# Patient Record
Sex: Female | Born: 1960 | Race: Black or African American | Hispanic: No | Marital: Single | Smoking: Former smoker
Health system: Southern US, Community
[De-identification: ages and names within clinical notes are randomized; demographics above are authoritative.]

## PROBLEM LIST (undated history)

## (undated) DIAGNOSIS — Z972 Presence of dental prosthetic device (complete) (partial): Secondary | ICD-10-CM

## (undated) DIAGNOSIS — E559 Vitamin D deficiency, unspecified: Secondary | ICD-10-CM

## (undated) DIAGNOSIS — Z862 Personal history of diseases of the blood and blood-forming organs and certain disorders involving the immune mechanism: Secondary | ICD-10-CM

## (undated) DIAGNOSIS — I10 Essential (primary) hypertension: Secondary | ICD-10-CM

## (undated) DIAGNOSIS — R011 Cardiac murmur, unspecified: Secondary | ICD-10-CM

## (undated) DIAGNOSIS — N950 Postmenopausal bleeding: Secondary | ICD-10-CM

## (undated) HISTORY — DX: Cardiac murmur, unspecified: R01.1

## (undated) HISTORY — DX: Postmenopausal bleeding: N95.0

## (undated) HISTORY — DX: Vitamin D deficiency, unspecified: E55.9

## (undated) HISTORY — DX: Personal history of diseases of the blood and blood-forming organs and certain disorders involving the immune mechanism: Z86.2

## (undated) HISTORY — DX: Essential (primary) hypertension: I10

---

## 1998-05-24 ENCOUNTER — Other Ambulatory Visit: Admission: RE | Admit: 1998-05-24 | Discharge: 1998-05-24 | Payer: Self-pay | Admitting: Obstetrics and Gynecology

## 2002-04-04 ENCOUNTER — Other Ambulatory Visit: Admission: RE | Admit: 2002-04-04 | Discharge: 2002-04-04 | Payer: Self-pay | Admitting: Obstetrics and Gynecology

## 2002-06-22 ENCOUNTER — Encounter: Payer: Self-pay | Admitting: Obstetrics and Gynecology

## 2002-06-22 ENCOUNTER — Ambulatory Visit (HOSPITAL_COMMUNITY): Admission: RE | Admit: 2002-06-22 | Discharge: 2002-06-22 | Payer: Self-pay | Admitting: Obstetrics and Gynecology

## 2004-06-16 HISTORY — PX: GASTRIC BYPASS: SHX52

## 2004-07-11 ENCOUNTER — Encounter: Admission: RE | Admit: 2004-07-11 | Discharge: 2004-10-09 | Payer: Self-pay | Admitting: General Surgery

## 2004-07-31 ENCOUNTER — Other Ambulatory Visit: Payer: Self-pay

## 2004-07-31 ENCOUNTER — Ambulatory Visit: Payer: Self-pay | Admitting: Specialist

## 2004-12-18 ENCOUNTER — Encounter: Admission: RE | Admit: 2004-12-18 | Discharge: 2005-03-18 | Payer: Self-pay | Admitting: General Surgery

## 2004-12-31 ENCOUNTER — Inpatient Hospital Stay (HOSPITAL_COMMUNITY): Admission: RE | Admit: 2004-12-31 | Discharge: 2005-01-03 | Payer: Self-pay | Admitting: General Surgery

## 2005-04-24 ENCOUNTER — Encounter: Admission: RE | Admit: 2005-04-24 | Discharge: 2005-06-15 | Payer: Self-pay | Admitting: General Surgery

## 2005-08-01 ENCOUNTER — Encounter: Admission: RE | Admit: 2005-08-01 | Discharge: 2005-10-30 | Payer: Self-pay | Admitting: General Surgery

## 2007-01-05 ENCOUNTER — Encounter: Payer: Self-pay | Admitting: Specialist

## 2007-01-15 ENCOUNTER — Encounter: Payer: Self-pay | Admitting: Specialist

## 2007-02-15 ENCOUNTER — Encounter: Payer: Self-pay | Admitting: Specialist

## 2007-03-17 ENCOUNTER — Encounter: Payer: Self-pay | Admitting: Specialist

## 2009-11-27 ENCOUNTER — Emergency Department: Payer: Self-pay | Admitting: Emergency Medicine

## 2011-07-20 IMAGING — CR DG CHEST 2V
1 series · 2 of 2 positions shown · non-contrast
Comparison: none

REASON FOR EXAM: syncope
COMMENTS:

PROCEDURE:     DXR - DXR CHEST PA (OR AP) AND LATERAL  - November 27, 2009  [DATE]
RESULT:     The lungs are clear. The heart and pulmonary vessels are normal.
The bony and mediastinal structures are unremarkable. There is no effusion.
There is no pneumothorax or evidence of congestive failure.

[Series 1: view not recorded · 0.17mm/px · 2 of 2 slices shown]
[im 1/2]
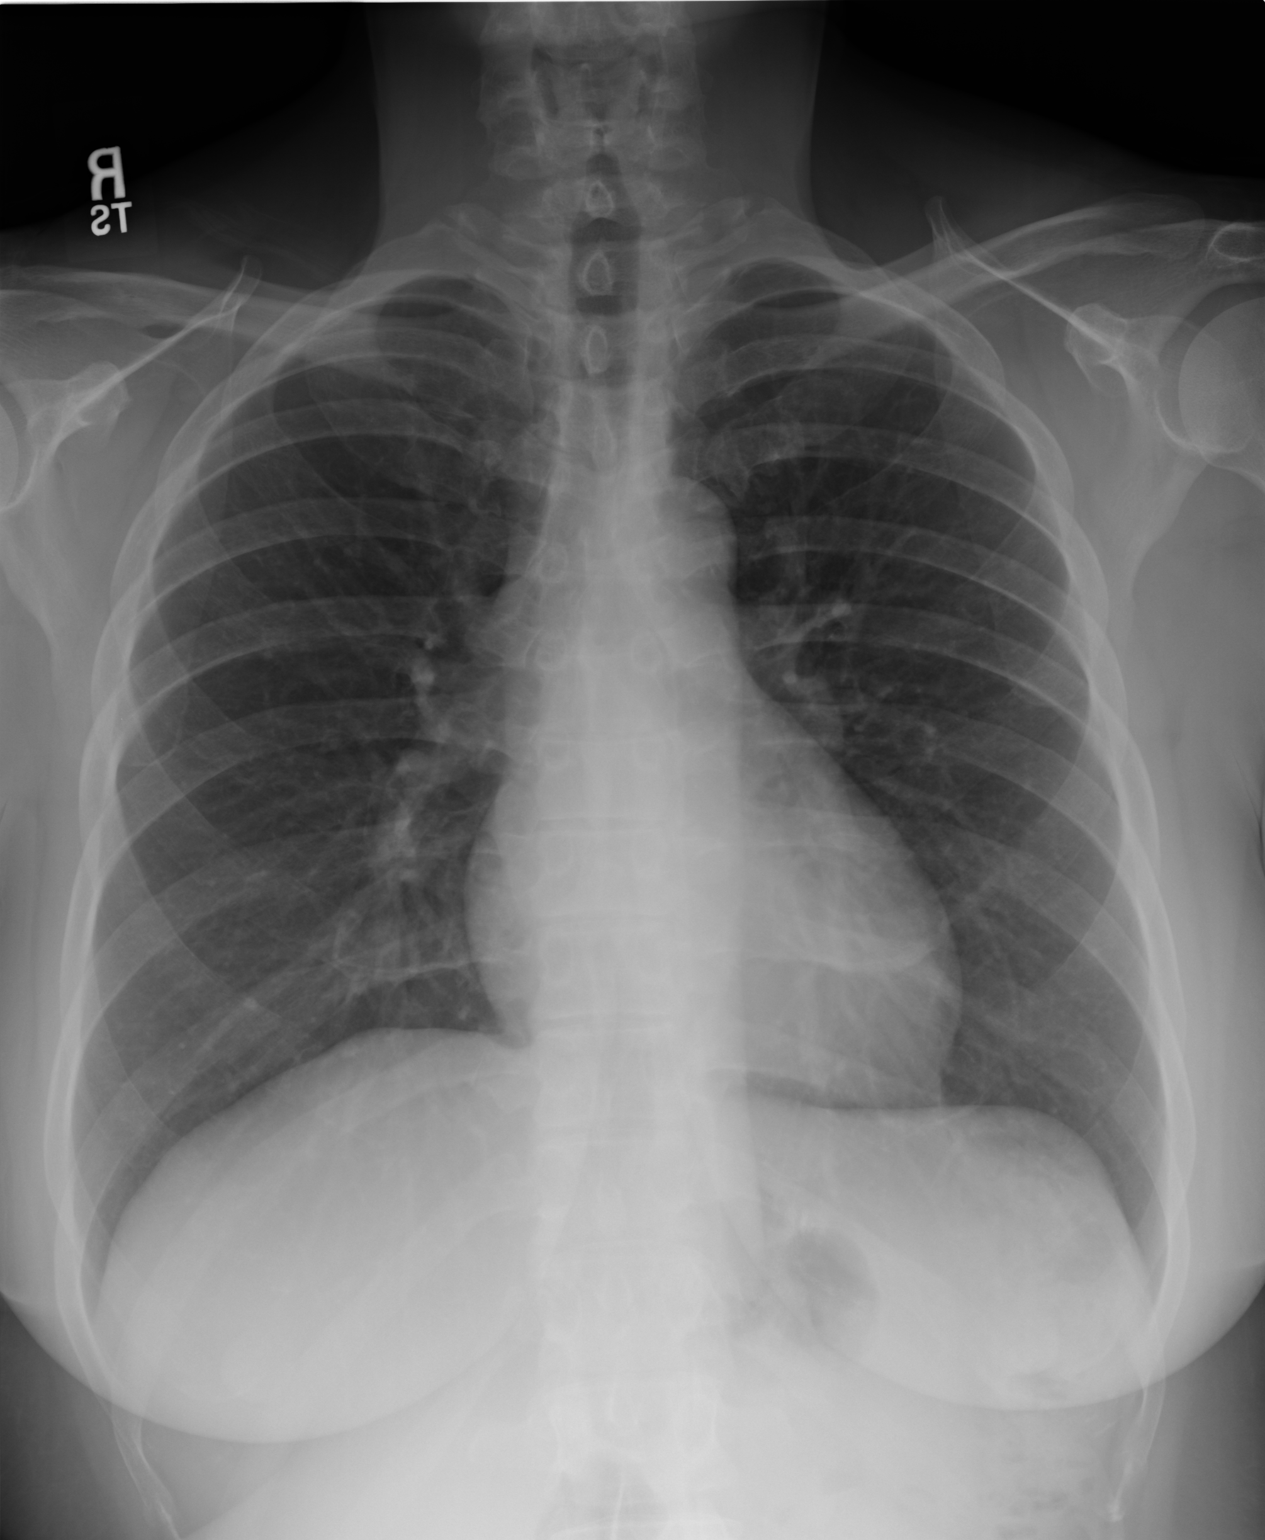
[im 2/2]
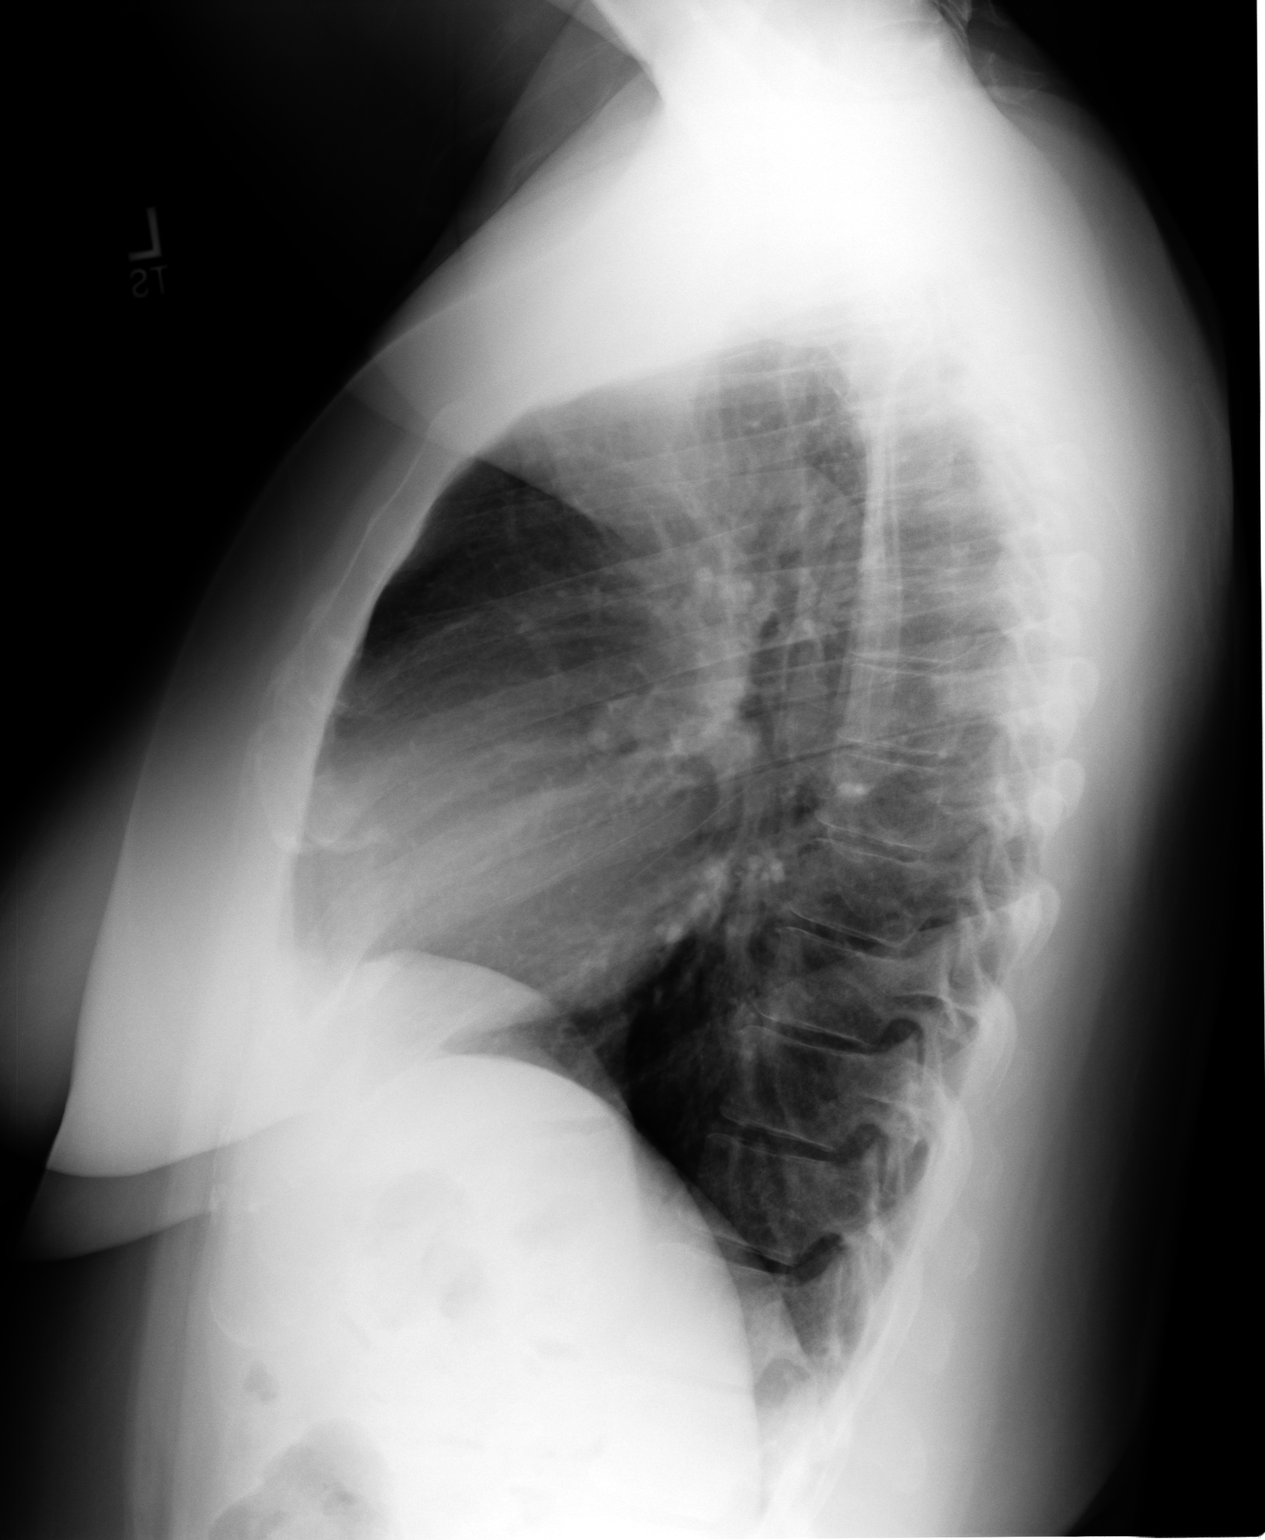

[2 of 2 positions shown; findings below may reference images not displayed]

IMPRESSION: No acute cardiopulmonary disease.

## 2012-04-16 DIAGNOSIS — M549 Dorsalgia, unspecified: Secondary | ICD-10-CM | POA: Insufficient documentation

## 2012-06-14 DIAGNOSIS — J329 Chronic sinusitis, unspecified: Secondary | ICD-10-CM | POA: Insufficient documentation

## 2013-06-13 LAB — LIPID PANEL
Cholesterol: 179 mg/dL (ref 0–200)
HDL: 107 mg/dL — AB (ref 35–70)
LDL Cholesterol: 59 mg/dL
Triglycerides: 65 mg/dL (ref 40–160)

## 2013-06-13 LAB — HEMOGLOBIN A1C: Hgb A1c MFr Bld: 5.8 % (ref 4.0–6.0)

## 2013-06-13 LAB — TSH: TSH: 1.13 u[IU]/mL (ref 0.41–5.90)

## 2013-12-08 ENCOUNTER — Ambulatory Visit: Payer: Self-pay | Admitting: Cardiovascular Disease

## 2013-12-08 ENCOUNTER — Encounter: Payer: Self-pay | Admitting: *Deleted

## 2014-06-12 DIAGNOSIS — J019 Acute sinusitis, unspecified: Secondary | ICD-10-CM

## 2014-06-12 DIAGNOSIS — B9689 Other specified bacterial agents as the cause of diseases classified elsewhere: Secondary | ICD-10-CM | POA: Insufficient documentation

## 2015-03-19 ENCOUNTER — Encounter: Payer: Self-pay | Admitting: *Deleted

## 2015-03-27 ENCOUNTER — Ambulatory Visit (INDEPENDENT_AMBULATORY_CARE_PROVIDER_SITE_OTHER): Payer: 59 | Admitting: Obstetrics and Gynecology

## 2015-03-27 ENCOUNTER — Encounter: Payer: Self-pay | Admitting: Obstetrics and Gynecology

## 2015-03-27 VITALS — BP 133/82 | HR 86 | Ht 66.0 in

## 2015-03-27 DIAGNOSIS — R5383 Other fatigue: Secondary | ICD-10-CM | POA: Diagnosis not present

## 2015-03-27 DIAGNOSIS — F329 Major depressive disorder, single episode, unspecified: Secondary | ICD-10-CM

## 2015-03-27 DIAGNOSIS — E669 Obesity, unspecified: Secondary | ICD-10-CM | POA: Diagnosis not present

## 2015-03-27 DIAGNOSIS — F32A Depression, unspecified: Secondary | ICD-10-CM

## 2015-03-27 MED ORDER — BUPROPION HCL ER (SR) 150 MG PO TB12: 150.0000 mg | ORAL_TABLET | Freq: Two times a day (BID) | ORAL | Status: DC

## 2015-03-27 NOTE — Patient Instructions (Signed)
Thank you for enrolling in MyChart. Please follow the instructions below to securely access your online medical record. MyChart allows you to send messages to your doctor, view your test results, renew your prescriptions, schedule appointments, and more.  How Do I Sign Up? 1. In your Internet browser, go to [email or web address] 2. Click on the New  User? link in the Sign In box.  3. Enter your MyChart Access Code exactly as it appears below. You will not need to use this code after you have completed the sign-up process. If you do not sign up before the expiration date, you must request a new code. MyChart Access Code: [email or web address] Expires: 05/26/2015 11:16 AM  4. Enter the last four digits of your Social Security Number (xxxx) and Date of Birth (mm/dd/yyyy) as indicated and click Next. You will be taken to the next sign-up page. 5. Create a MyChart ID. This will be your MyChart login ID and cannot be changed, so think of one that is secure and easy to remember. 6. Create a MyChart password. You can change your password at any time. 7. Enter your Password Reset Question and Answer and click Next. This can be used at a later time if you forget your password.  8. Select your communication preference, and if applicable enter your e-mail address. You will receive e-mail notification when new information is available in MyChart by choosing to receive e-mail notifications and filling in your e-mail. 9. Click Sign In. You can now view your medical record.   Additional Information If you have questions, you can email [email or web address] or call [phone] to talk to our MyChart staff. Remember, MyChart is NOT to be used for urgent needs. For medical emergencies, dial 911.   Fatigue Fatigue is feeling tired all of the time, a lack of energy, or a lack of motivation. Occasional or mild fatigue is often a normal response to activity or life in general. However,  long-lasting (chronic) or extreme fatigue may indicate an underlying medical condition. HOME CARE INSTRUCTIONS  Watch your fatigue for any changes. The following actions may help to lessen any discomfort you are feeling:  Talk to your health care provider about how much sleep you need each night. Try to get the required amount every night.  Take medicines only as directed by your health care provider.  Eat a healthy and nutritious diet. Ask your health care provider if you need help changing your diet.  Drink enough fluid to keep your urine clear or pale yellow.  Practice ways of relaxing, such as yoga, meditation, massage therapy, or acupuncture.  Exercise regularly.   Change situations that cause you stress. Try to keep your work and personal routine reasonable.  Do not abuse illegal drugs.  Limit alcohol intake to no more than 1 drink per day for nonpregnant women and 2 drinks per day for men. One drink equals 12 ounces of beer, 5 ounces of wine, or 1 ounces of hard liquor.  Take a multivitamin, if directed by your health care provider. SEEK MEDICAL CARE IF:   Your fatigue does not get better.  You have a fever.   You have unintentional weight loss or gain.  You have headaches.   You have difficulty:   Falling asleep.  Sleeping throughout the night.  You feel angry, guilty, anxious, or sad.   You are unable to have a bowel movement (constipation).   You skin is dry.   Your legs or another  part of your body is swollen.  SEEK IMMEDIATE MEDICAL CARE IF:   You feel confused.   Your vision is blurry.  You feel faint or pass out.   You have a severe headache.   You have severe abdominal, pelvic, or back pain.   You have chest pain, shortness of breath, or an irregular or fast heartbeat.   You are unable to urinate or you urinate less than normal.   You develop abnormal bleeding, such as bleeding from the rectum, vagina, nose, lungs, or  nipples.  You vomit blood.   You have thoughts about harming yourself or committing suicide.   You are worried that you might harm someone else.    This information is not intended to replace advice given to you by your health care provider. Make sure you discuss any questions you have with your health care provider.   Document Released: 03/30/2007 Document Revised: 06/23/2014 Document Reviewed: 10/04/2013 Elsevier Interactive Patient Education 2016 Elsevier Inc. Major Depressive Disorder Major depressive disorder is a mental illness. It also may be called clinical depression or unipolar depression. Major depressive disorder usually causes feelings of sadness, hopelessness, or helplessness. Some people with this disorder do not feel particularly sad but lose interest in doing things they used to enjoy (anhedonia). Major depressive disorder also can cause physical symptoms. It can interfere with work, school, relationships, and other normal everyday activities. The disorder varies in severity but is longer lasting and more serious than the sadness we all feel from time to time in our lives. Major depressive disorder often is triggered by stressful life events or major life changes. Examples of these triggers include divorce, loss of your job or home, a move, and the death of a family member or close friend. Sometimes this disorder occurs for no obvious reason at all. People who have family members with major depressive disorder or bipolar disorder are at higher risk for developing this disorder, with or without life stressors. Major depressive disorder can occur at any age. It may occur just once in your life (single episode major depressive disorder). It may occur multiple times (recurrent major depressive disorder). SYMPTOMS People with major depressive disorder have either anhedonia or depressed mood on nearly a daily basis for at least 2 weeks or longer. Symptoms of depressed mood  include:  Feelings of sadness (blue or down in the dumps) or emptiness.  Feelings of hopelessness or helplessness.  Tearfulness or episodes of crying (may be observed by others).  Irritability (children and adolescents). In addition to depressed mood or anhedonia or both, people with this disorder have at least four of the following symptoms:  Difficulty sleeping or sleeping too much.   Significant change (increase or decrease) in appetite or weight.   Lack of energy or motivation.  Feelings of guilt and worthlessness.   Difficulty concentrating, remembering, or making decisions.  Unusually slow movement (psychomotor retardation) or restlessness (as observed by others).   Recurrent wishes for death, recurrent thoughts of self-harm (suicide), or a suicide attempt. People with major depressive disorder commonly have persistent negative thoughts about themselves, other people, and the world. People with severe major depressive disorder may experiencedistorted beliefs or perceptions about the world (psychotic delusions). They also may see or hear things that are not real (psychotic hallucinations). DIAGNOSIS Major depressive disorder is diagnosed through an assessment by your health care provider. Your health care provider will ask aboutaspects of your daily life, such as mood,sleep, and appetite, to see if you  have the diagnostic symptoms of major depressive disorder. Your health care provider may ask about your medical history and use of alcohol or drugs, including prescription medicines. Your health care provider also may do a physical exam and blood work. This is because certain medical conditions and the use of certain substances can cause major depressive disorder-like symptoms (secondary depression). Your health care provider also may refer you to a mental health specialist for further evaluation and treatment. TREATMENT It is important to recognize the symptoms of major  depressive disorder and seek treatment. The following treatments can be prescribed for this disorder:   Medicine. Antidepressant medicines usually are prescribed. Antidepressant medicines are thought to correct chemical imbalances in the brain that are commonly associated with major depressive disorder. Other types of medicine may be added if the symptoms do not respond to antidepressant medicines alone or if psychotic delusions or hallucinations occur.  Talk therapy. Talk therapy can be helpful in treating major depressive disorder by providing support, education, and guidance. Certain types of talk therapy also can help with negative thinking (cognitive behavioral therapy) and with relationship issues that trigger this disorder (interpersonal therapy). A mental health specialist can help determine which treatment is best for you. Most people with major depressive disorder do well with a combination of medicine and talk therapy. Treatments involving electrical stimulation of the brain can be used in situations with extremely severe symptoms or when medicine and talk therapy do not work over time. These treatments include electroconvulsive therapy, transcranial magnetic stimulation, and vagal nerve stimulation.   This information is not intended to replace advice given to you by your health care provider. Make sure you discuss any questions you have with your health care provider.   Document Released: 09/27/2012 Document Revised: 06/23/2014 Document Reviewed: 09/27/2012 Elsevier Interactive Patient Education 2016 [employer].   Bupropion extended-release tablets (Depression/Mood Disorders) What is this medicine? BUPROPION (byoo PROE pee on) is used to treat depression. This medicine may be used for other purposes; ask your health care provider or pharmacist if you have questions. What should I tell my health care provider before I take this medicine? They need to know if you have any of these  conditions: -an eating disorder, such as anorexia or bulimia -bipolar disorder or psychosis -diabetes or high blood sugar, treated with medication -glaucoma -head injury or brain tumor -heart disease, previous heart attack, or irregular heart beat -high blood pressure -kidney or liver disease -seizures (convulsions) -suicidal thoughts or a previous suicide attempt -Tourette's syndrome -weight loss -an unusual or allergic reaction to bupropion, other medicines, foods, dyes, or preservatives -breast-feeding -pregnant or trying to become pregnant How should I use this medicine? Take this medicine by mouth with a glass of water. Follow the directions on the prescription label. You can take it with or without food. If it upsets your stomach, take it with food. Do not crush, chew, or cut these tablets. This medicine is taken once daily at the same time each day. Do not take your medicine more often than directed. Do not stop taking this medicine suddenly except upon the advice of your doctor. Stopping this medicine too quickly may cause serious side effects or your condition may worsen. A special MedGuide will be given to you by the pharmacist with each prescription and refill. Be sure to read this information carefully each time. Talk to your pediatrician regarding the use of this medicine in children. Special care may be needed. Overdosage: If you think you have  taken too much of this medicine contact a poison control center or emergency room at once. NOTE: This medicine is only for you. Do not share this medicine with others. What if I miss a dose? If you miss a dose, skip the missed dose and take your next tablet at the regular time. Do not take double or extra doses. What may interact with this medicine? Do not take this medicine with any of the following medications: -linezolid -MAOIs like Azilect, Carbex, Eldepryl, Marplan, Nardil, and Parnate -methylene blue (injected into a  vein) -other medicines that contain bupropion like Zyban This medicine may also interact with the following medications: -alcohol -certain medicines for anxiety or sleep -certain medicines for blood pressure like metoprolol, propranolol -certain medicines for depression or psychotic disturbances -certain medicines for HIV or AIDS like efavirenz, lopinavir, nelfinavir, ritonavir -certain medicines for irregular heart beat like propafenone, flecainide -certain medicines for Parkinson's disease like amantadine, levodopa -certain medicines for seizures like carbamazepine, phenytoin, phenobarbital -cimetidine -clopidogrel -cyclophosphamide -furazolidone -isoniazid -nicotine -orphenadrine -procarbazine -steroid medicines like prednisone or cortisone -stimulant medicines for attention disorders, weight loss, or to stay awake -tamoxifen -theophylline -thiotepa -ticlopidine -tramadol -warfarin This list may not describe all possible interactions. Give your health care provider a list of all the medicines, herbs, non-prescription drugs, or dietary supplements you use. Also tell them if you smoke, drink alcohol, or use illegal drugs. Some items may interact with your medicine. What should I watch for while using this medicine? Tell your doctor if your symptoms do not get better or if they get worse. Visit your doctor or health care professional for regular checks on your progress. Because it may take several weeks to see the full effects of this medicine, it is important to continue your treatment as prescribed by your doctor. Patients and their families should watch out for new or worsening thoughts of suicide or depression. Also watch out for sudden changes in feelings such as feeling anxious, agitated, panicky, irritable, hostile, aggressive, impulsive, severely restless, overly excited and hyperactive, or not being able to sleep. If this happens, especially at the beginning of treatment or  after a change in dose, call your health care professional. Avoid alcoholic drinks while taking this medicine. Drinking large amounts of alcoholic beverages, using sleeping or anxiety medicines, or quickly stopping the use of these agents while taking this medicine may increase your risk for a seizure. Do not drive or use heavy machinery until you know how this medicine affects you. This medicine can impair your ability to perform these tasks. Do not take this medicine close to bedtime. It may prevent you from sleeping. Your mouth may get dry. Chewing sugarless gum or sucking hard candy, and drinking plenty of water may help. Contact your doctor if the problem does not go away or is severe. The tablet shell for some brands of this medicine does not dissolve. This is normal. The tablet shell may appear whole in the stool. This is not a cause for concern. What side effects may I notice from receiving this medicine? Side effects that you should report to your doctor or health care professional as soon as possible: -allergic reactions like skin rash, itching or hives, swelling of the face, lips, or tongue -breathing problems -changes in vision -confusion -fast or irregular heartbeat -hallucinations -increased blood pressure -redness, blistering, peeling or loosening of the skin, including inside the mouth -seizures -suicidal thoughts or other mood changes -unusually weak or tired -vomiting Side effects that usually do  not require medical attention (report to your doctor or health care professional if they continue or are bothersome): -change in sex drive or performance -constipation -headache -loss of appetite -nausea -tremors -weight loss This list may not describe all possible side effects. Call your doctor for medical advice about side effects. You may report side effects to FDA at 1-800-FDA-1088. Where should I keep my medicine? Keep out of the reach of children. Store at room  temperature between 15 and 30 degrees C (59 and 86 degrees F). Throw away any unused medicine after the expiration date. NOTE: This sheet is a summary. It may not cover all possible information. If you have questions about this medicine, talk to your doctor, pharmacist, or health care provider.    2016, Elsevier/Gold Standard. (2012-12-24 12:39:42)
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
## 2015-04-04 LAB — COMPREHENSIVE METABOLIC PANEL
ALBUMIN: 3.9 g/dL (ref 3.5–5.5)
ALT: 14 IU/L (ref 0–32)
AST: 21 IU/L (ref 0–40)
Albumin/Globulin Ratio: 1.3 (ref 1.1–2.5)
Alkaline Phosphatase: 75 IU/L (ref 39–117)
BUN / CREAT RATIO: 24 — AB (ref 9–23)
BUN: 19 mg/dL (ref 6–24)
Bilirubin Total: <0.2 mg/dL (ref 0.0–1.2)
CALCIUM: 8.6 mg/dL — AB (ref 8.7–10.2)
CO2: 22 mmol/L (ref 18–29)
CREATININE: 0.79 mg/dL (ref 0.57–1.00)
Chloride: 106 mmol/L (ref 97–106)
GFR calc Af Amer: 97 mL/min/{1.73_m2} (ref >59)
GFR, EST NON AFRICAN AMERICAN: 85 mL/min/{1.73_m2} (ref >59)
GLOBULIN, TOTAL: 2.9 g/dL (ref 1.5–4.5)
Glucose: 79 mg/dL (ref 65–99)
Potassium: 3.8 mmol/L (ref 3.5–5.2)
SODIUM: 142 mmol/L (ref 136–144)
Total Protein: 6.8 g/dL (ref 6.0–8.5)

## 2015-04-04 LAB — VITAMIN D 25 HYDROXY (VIT D DEFICIENCY, FRACTURES): Vit D, 25-Hydroxy: 20.7 ng/mL — ABNORMAL LOW (ref 30.0–100.0)

## 2015-04-04 LAB — VITAMIN B12: Vitamin B-12: 295 pg/mL (ref 211–946)

## 2015-04-04 LAB — CBC
HEMATOCRIT: 33.2 % — AB (ref 34.0–46.6)
Hemoglobin: 10.9 g/dL — ABNORMAL LOW (ref 11.1–15.9)
MCH: 27.9 pg (ref 26.6–33.0)
MCHC: 32.8 g/dL (ref 31.5–35.7)
MCV: 85 fL (ref 79–97)
Platelets: 293 10*3/uL (ref 150–379)
RBC: 3.9 x10E6/uL (ref 3.77–5.28)
RDW: 15.8 % — AB (ref 12.3–15.4)
WBC: 4.3 10*3/uL (ref 3.4–10.8)

## 2015-04-04 LAB — HEMOGLOBIN A1C
ESTIMATED AVERAGE GLUCOSE: 123 mg/dL
Hgb A1c MFr Bld: 5.9 % — ABNORMAL HIGH (ref 4.8–5.6)

## 2015-04-04 LAB — THYROID PANEL WITH TSH
Free Thyroxine Index: 1.9 (ref 1.2–4.9)
T3 Uptake Ratio: 28 % (ref 24–39)
T4 TOTAL: 6.7 ug/dL (ref 4.5–12.0)
TSH: 0.696 u[IU]/mL (ref 0.450–4.500)

## 2015-04-04 LAB — FERRITIN: Ferritin: 12 ng/mL — ABNORMAL LOW (ref 15–150)

## 2015-04-04 LAB — FOLATE: FOLATE: 19.3 ng/mL (ref >3.0)

## 2015-04-04 LAB — ESTRADIOL

## 2015-04-04 LAB — PROGESTERONE

## 2015-04-13 ENCOUNTER — Other Ambulatory Visit: Payer: Self-pay | Admitting: Obstetrics and Gynecology

## 2015-04-13 DIAGNOSIS — E559 Vitamin D deficiency, unspecified: Secondary | ICD-10-CM

## 2015-04-13 DIAGNOSIS — D649 Anemia, unspecified: Secondary | ICD-10-CM

## 2015-04-13 MED ORDER — VITAMIN D (ERGOCALCIFEROL) 1.25 MG (50000 UNIT) PO CAPS
50000.0000 [IU] | ORAL_CAPSULE | ORAL | Status: DC
Start: 2015-04-13 — End: 2015-08-02

## 2015-04-13 MED ORDER — FUSION PLUS PO CAPS: 1.0000 | ORAL_CAPSULE | Freq: Every day | ORAL | Status: DC

## 2015-04-27 ENCOUNTER — Telehealth: Payer: Self-pay | Admitting: *Deleted

## 2015-04-27 NOTE — Telephone Encounter (Signed)
-----   Message from Ulyses AmorMelody N Burr, [place] sent at 04/13/2015 12:55 PM EDT ----- Please let her know she is anemic and has vit D def, please mail info on vit D and let her know I sent in 2 RX- one for Fusion and the other for weekly vit D- both could be the cause of her symptoms, and I want to recheck her labs in Jan at next appt. Also her hormone levels are low and she could benefit from HRT - at least progesterone. Let me know if she desires medication.

## 2015-04-27 NOTE — Telephone Encounter (Signed)
Notified pt of results, mailed info on Vit d 

## 2015-07-10 ENCOUNTER — Encounter: Payer: Self-pay | Admitting: Obstetrics and Gynecology

## 2015-08-02 ENCOUNTER — Ambulatory Visit (INDEPENDENT_AMBULATORY_CARE_PROVIDER_SITE_OTHER): Payer: 59 | Admitting: Nurse Practitioner

## 2015-08-02 ENCOUNTER — Encounter: Payer: Self-pay | Admitting: Nurse Practitioner

## 2015-08-02 VITALS — BP 114/78 | HR 80 | Temp 98.2°F | Resp 12 | Ht 66.0 in

## 2015-08-02 DIAGNOSIS — Z9884 Bariatric surgery status: Secondary | ICD-10-CM

## 2015-08-02 DIAGNOSIS — R202 Paresthesia of skin: Secondary | ICD-10-CM | POA: Diagnosis not present

## 2015-08-02 DIAGNOSIS — I10 Essential (primary) hypertension: Secondary | ICD-10-CM

## 2015-08-02 DIAGNOSIS — E669 Obesity, unspecified: Secondary | ICD-10-CM | POA: Insufficient documentation

## 2015-08-02 DIAGNOSIS — G4700 Insomnia, unspecified: Secondary | ICD-10-CM

## 2015-08-02 DIAGNOSIS — Z7689 Persons encountering health services in other specified circumstances: Secondary | ICD-10-CM

## 2015-08-02 DIAGNOSIS — Z9889 Other specified postprocedural states: Secondary | ICD-10-CM

## 2015-08-02 DIAGNOSIS — Z7189 Other specified counseling: Secondary | ICD-10-CM

## 2015-08-02 DIAGNOSIS — Z0000 Encounter for general adult medical examination without abnormal findings: Secondary | ICD-10-CM | POA: Insufficient documentation

## 2015-08-02 DIAGNOSIS — E559 Vitamin D deficiency, unspecified: Secondary | ICD-10-CM | POA: Insufficient documentation

## 2015-08-02 NOTE — Progress Notes (Signed)
Patient ID: Anna Mcgee, female    DOB: 1960/09/25  Age: 55 y.o. MRN: [id number]  CC: Establish Care   HPI TEKOA HAMOR presents for CC of establishing care and CC of insomnia and gastric bypass.   1) New Pt Info:   Immunizations- Need records   Mammogram- Never had, does not want  Pap- 2015 with [facility]   Colonoscopy- IFOBT testing yearly   2) Chronic Problems-  HTN- diovan-HCT combo   Back pain- from weight   3) Acute Problems- Gastric Bypass- refused to weigh  Weight loss   HP- weight loss place   [facility]- shakes twice daily   January  Zumba 3 days a week   Insomnia- Falling asleep difficult, staying asleep, 6 hrs broken   Tried: Tylenol or Advil pm- helpful   Naps during lunch break 30 minutes  No pets   Right hand pins and needles often, elbow down to fingers   Started months ago, but happens intermittently   Right hand dominant, happens during night and day   History Cyndee has a past medical history of Hypertension; Vitamin D deficiency; Postmenopausal bleeding; History of anemia; Vitamin D deficiency; and Heart murmur.   She has past surgical history that includes Gastric bypass (2006).   Her family history includes Arthritis in her maternal grandmother; Hypertension in her father and mother.She reports that she quit smoking about 5 years ago. Her smoking use included Cigarettes. She has never used smokeless tobacco. She reports that she drinks alcohol. She reports that she does not use illicit drugs.  Outpatient Prescriptions Prior to Visit  Medication Sig Dispense Refill  . buPROPion (WELLBUTRIN SR) 150 MG 12 hr tablet Take 1 tablet (150 mg total) by mouth 2 (two) times daily. 60 tablet 6  . valsartan-hydrochlorothiazide (DIOVAN-HCT) 160-25 MG per tablet Take 1 tablet by mouth daily.    . Iron-FA-B Cmp-C-Biot-Probiotic (FUSION PLUS) CAPS Take 1 capsule by mouth daily. (Patient not taking: Reported on 08/02/2015) 60 capsule 1  . ketoconazole (NIZORAL) 2  % cream Apply 1 application topically daily. Reported on 08/02/2015    . PARoxetine Mesylate (BRISDELLE) 7.5 MG CAPS Take by mouth. Reported on 08/02/2015    . Vitamin D, Ergocalciferol, (DRISDOL) 50000 UNITS CAPS capsule Take 1 capsule (50,000 Units total) by mouth every 7 (seven) days. (Patient not taking: Reported on 08/02/2015) 30 capsule 2   No facility-administered medications prior to visit.    ROS Review of Systems  Constitutional: Negative for fever, chills, diaphoresis and fatigue.  Respiratory: Negative for chest tightness, shortness of breath and wheezing.   Cardiovascular: Negative for chest pain, palpitations and leg swelling.  Gastrointestinal: Negative for nausea, vomiting and diarrhea.  Skin: Negative for rash.  Neurological: Negative for dizziness, weakness, numbness and headaches.  Psychiatric/Behavioral: The patient is not nervous/anxious.     Objective:  BP 114/78 mmHg  Pulse 80  Temp(Src) 98.2 F (36.8 C) (Oral)  Resp 12  Ht  (1.676 m)  Wt   SpO2 98%  Physical Exam  Constitutional: She is oriented to person, place, and time. She appears well-developed and well-nourished. No distress.  HENT:  Head: Normocephalic and atraumatic.  Right Ear: External ear normal.  Left Ear: External ear normal.  Cardiovascular: Normal rate, regular rhythm and normal heart sounds.  Exam reveals no gallop and no friction rub.   No murmur heard. Pulmonary/Chest: Effort normal and breath sounds normal. No respiratory distress. She has no wheezes. She has no rales. She exhibits no  tenderness.  Neurological: She is alert and oriented to person, place, and time. No cranial nerve deficit. She exhibits normal muscle tone. Coordination normal.  Skin: Skin is warm and dry. No rash noted. She is not diaphoretic.  Psychiatric: She has a normal mood and affect. Her behavior is normal. Judgment and thought content normal.   Assessment & Plan:   Aalani was seen today for establish  care.  Diagnoses and all orders for this visit:  Encounter to establish care  Insomnia  Essential hypertension  Paresthesia  History of gastric bypass   I have discontinued Ms. Tidd's ketoconazole, PARoxetine Mesylate, FUSION PLUS, and Vitamin D (Ergocalciferol). I am also having her maintain her valsartan-hydrochlorothiazide and buPROPion.  No orders of the defined types were placed in this encounter.     Follow-up: Return in about 2 months (around 09/30/2015) for Follow up.

## 2015-08-02 NOTE — Progress Notes (Signed)
Pre-visit discussion using our clinic review tool. No additional management support is needed unless otherwise documented below in the visit note.  

## 2015-08-02 NOTE — Patient Instructions (Addendum)
Melatonin over the counter  Keep up the great work with [facility] and maybe add in some low carb options  Follow up in 2 months.

## 2015-08-02 NOTE — Assessment & Plan Note (Signed)
Discussed acute and chronic issues. Reviewed health maintenance measures, PFSHx, and immunizations. Obtain records from previous facility.   

## 2015-08-06 DIAGNOSIS — Z9884 Bariatric surgery status: Secondary | ICD-10-CM | POA: Insufficient documentation

## 2015-08-06 DIAGNOSIS — G4700 Insomnia, unspecified: Secondary | ICD-10-CM | POA: Insufficient documentation

## 2015-08-06 DIAGNOSIS — R202 Paresthesia of skin: Secondary | ICD-10-CM | POA: Insufficient documentation

## 2015-08-06 NOTE — Assessment & Plan Note (Signed)
Right arm New onset Positional Advised pt to try wrapping a rolled towel around elbow and/or wrist to keep a neutral position FU in 2 months

## 2015-08-06 NOTE — Assessment & Plan Note (Signed)
BP Readings from Last 3 Encounters:  08/02/15 114/78  03/27/15 133/82   Stable. Continue current regimen

## 2015-08-06 NOTE — Assessment & Plan Note (Signed)
Stable. Pt wants to continue to discuss weight loss She has started [employer] recently and will continue this She is also doing Zumba  I encouraged her to continue these and FU in 2 months

## 2015-08-06 NOTE — Assessment & Plan Note (Signed)
New problem  Willing to try Melatonin and other OTC products at this time May talk about referral for sleep study at next visit FU in 2 months

## 2015-09-17 ENCOUNTER — Ambulatory Visit (INDEPENDENT_AMBULATORY_CARE_PROVIDER_SITE_OTHER): Payer: 59 | Admitting: Family Medicine

## 2015-09-17 ENCOUNTER — Encounter: Payer: Self-pay | Admitting: Family Medicine

## 2015-09-17 VITALS — BP 126/72 | HR 77 | Temp 98.3°F | Ht 66.0 in

## 2015-09-17 DIAGNOSIS — R21 Rash and other nonspecific skin eruption: Secondary | ICD-10-CM

## 2015-09-17 MED ORDER — TRIAMCINOLONE ACETONIDE 0.1 % EX CREA: 1.0000 "application " | TOPICAL_CREAM | Freq: Two times a day (BID) | CUTANEOUS | Status: DC

## 2015-09-17 NOTE — Patient Instructions (Signed)
Nice to meet you. Your rash is likely an allergic reaction to the hair product you used this weekend. The rash is consistent with hives. Please do not use that hair product again. You can try the topical steroid that is been sent to your pharmacy. He should use Claritin or Zyrtec to help with itching. You can continue Benadryl up to twice a day while on the Claritin and Zyrtec. If you develop fevers, body aches, nausea, vomiting, or any new or changing symptoms please seek medical attention.

## 2015-09-17 NOTE — Assessment & Plan Note (Signed)
Suspect allergic urticaria related to new hair product as cause of her current rash. No systemic signs of illness. 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 She has no respiratory complaints. Discussed possible treatments including oral prednisone versus topical steroid. Patient opted for topical steroid. We will also have her start over-the-counter Claritin or Zyrtec. Benadryl every 12 hours as needed for itching. She will continue to monitor. She is given return precautions.

## 2015-09-17 NOTE — Progress Notes (Signed)
Pre visit review using our clinic review tool, if applicable. No additional management support is needed unless otherwise documented below in the visit note. 

## 2015-09-17 NOTE — Progress Notes (Signed)
Patient ID: Anna Mcgee, female   DOB: 08/09/1960, 55 y.o.   MRN: [id number]  Anna AlarEric Kasee Hantz, MD Phone: [phone]  Anna Mcgee is a 55 y.o. female who presents today for same-day visit.  Patient notes onset of rash on ankles, right dorsal forearm, and posterior neck yesterday. She notes she used a new hair product on Saturday night and then woke up with itching rash behind her ears and on her posterior neck. Rash is consistent with urticaria. She also used a new detergent last week. No recent travel. No bedbugs at home. No contacts with rash. No fevers. No muscle aches. No joint aches. She feels well overall. No tick exposure. No trouble breathing. No throat or tongue involvement. No throat swelling.  PMH: Former smoker.   ROS see history of present illness  Objective  Physical Exam Filed Vitals:   09/17/15 1600  BP: 126/72  Pulse: 77  Temp: 98.3 F (36.8 C)    BP Readings from Last 3 Encounters:  09/17/15 126/72  08/02/15 114/78  03/27/15 133/82   Wt Readings from Last 3 Encounters:  No data found for Wt    Physical Exam  Constitutional: She is well-developed, well-nourished, and in no distress.  HENT:  Head: Normocephalic and atraumatic.  Right Ear: External ear normal.  Left Ear: External ear normal.  Mouth/Throat: Oropharynx is clear and moist. No oropharyngeal exudate.  Cardiovascular: Normal rate, regular rhythm and normal heart sounds.   Pulmonary/Chest: Effort normal and breath sounds normal.  Neurological: She is alert. Gait normal.  Skin: Skin is warm and dry.  Urticaria noted on the skin of her posterior neck and behind her ears, also wanted to urticaria on her dorsal right distal forearm and bilateral posterior ankles, there is no tenderness to these lesions, there is no warmth to these lesions, there is no induration to these lesions     Assessment/Plan: Please see individual problem list.  Rash and nonspecific skin eruption Suspect allergic  urticaria related to new hair product as cause of her current rash. No systemic signs of illness. 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 She has no respiratory complaints. Discussed possible treatments including oral prednisone versus topical steroid. Patient opted for topical steroid. We will also have her start over-the-counter Claritin or Zyrtec. Benadryl every 12 hours as needed for itching. She will continue to monitor. She is given return precautions.    No orders of the defined types were placed in this encounter.    Meds ordered this encounter  Medications  . triamcinolone cream (KENALOG) 0.1 %    Sig: Apply 1 application topically 2 (two) times daily.    Dispense:  30 g    Refill:  0   Anna AlarEric Letonia Stead, MD [facility] [place]

## 2015-09-24 ENCOUNTER — Ambulatory Visit (INDEPENDENT_AMBULATORY_CARE_PROVIDER_SITE_OTHER): Payer: 59 | Admitting: Family Medicine

## 2015-09-24 ENCOUNTER — Encounter: Payer: Self-pay | Admitting: Family Medicine

## 2015-09-24 VITALS — BP 128/74 | HR 90 | Temp 98.3°F | Ht 66.0 in

## 2015-09-24 DIAGNOSIS — R21 Rash and other nonspecific skin eruption: Secondary | ICD-10-CM

## 2015-09-24 MED ORDER — PREDNISONE 20 MG PO TABS: 40.0000 mg | ORAL_TABLET | Freq: Every day | ORAL | Status: DC

## 2015-09-24 NOTE — Progress Notes (Signed)
Patient ID: Juluis Rainierherry A Neuhaus, female   DOB: 07/20/1960, 55 y.o.   MRN: [id number]  Marikay AlarEric Asia Dusenbury, MD Phone: [phone]  Juluis RainierCherry A Cuccaro is a 55 y.o. female who presents today for follow-up.  Rash: Patient notes her rash was getting better. She was seen last week and the rash had the appearance of urticaria. They have become more raised and nodular. They continue to itch. There was improvement over the rash on her neck and arm though she had a new spot come upon her neck today. The rash continues on her right inner ankle. The cream has helped some and she has also been taking Zyrtec. No fevers. No nausea. No vomiting. She feels well overall. She does not use the care products she was using last week.   ROS see history of present illness  Objective  Physical Exam Filed Vitals:   09/24/15 1313  BP: 128/74  Pulse: 90  Temp: 98.3 F (36.8 C)    BP Readings from Last 3 Encounters:  09/24/15 128/74  09/17/15 126/72  08/02/15 114/78   Wt Readings from Last 3 Encounters:  No data found for Wt    Physical Exam  Constitutional: She is well-developed, well-nourished, and in no distress.  Cardiovascular: Normal rate, regular rhythm and normal heart sounds.   Pulmonary/Chest: Effort normal and breath sounds normal.  Neurological: She is alert. Gait normal.  Skin: She is not diaphoretic.  Rash appears slightly different today and is more nodular in nature with overlying erythema and excoriations, there is no fluctuance, there is no warmth, there is no drainage, these are blanchable, this rash is located on her right inner ankle and lower leg, right wrist dorsally, and her posterior neck near her hairline     Assessment/Plan: Please see individual problem list.  Rash and nonspecific skin eruption Rash appears slightly different today compared to previously. More nodular in appearance rather than urticarial. With the itching I still suspect likely histamine reaction and allergic  component. Patient appears systemically well. Vital signs are stable. We will trial prednisone. We will refer to dermatology for further evaluation. She is given return precautions.    Orders Placed This Encounter  Procedures  . Ambulatory referral to Dermatology    Referral Priority:  Routine    Referral Type:  Consultation    Referral Reason:  Specialty Services Required    Requested Specialty:  Dermatology    Number of Visits Requested:  1    Meds ordered this encounter  Medications  . predniSONE (DELTASONE) 20 MG tablet    Sig: Take 2 tablets (40 mg total) by mouth daily with breakfast.    Dispense:  10 tablet    Refill:  0    Marikay AlarEric Jaxyn Mestas, MD [facility] [place]

## 2015-09-24 NOTE — Assessment & Plan Note (Signed)
Rash appears slightly different today compared to previously. More nodular in appearance rather than urticarial. With the itching I still suspect likely histamine reaction and allergic component. Patient appears systemically well. Vital signs are stable. We will trial prednisone. We will refer to dermatology for further evaluation. She is given return precautions.

## 2015-09-24 NOTE — Patient Instructions (Signed)
Nice to see you. We will trial you on prednisone for a rash. We're going to refer you to dermatology for further evaluation of this issue. If you develop fevers, spreading rash, nausea, vomiting, sweats, feel poorly, or develop any new or changing symptoms please seek medical attention.

## 2015-09-24 NOTE — Progress Notes (Signed)
Pre visit review using our clinic review tool, if applicable. No additional management support is needed unless otherwise documented below in the visit note. 

## 2015-10-03 ENCOUNTER — Ambulatory Visit: Payer: 59 | Admitting: Nurse Practitioner

## 2015-10-29 ENCOUNTER — Telehealth: Payer: Self-pay | Admitting: Family Medicine

## 2015-10-29 DIAGNOSIS — R21 Rash and other nonspecific skin eruption: Secondary | ICD-10-CM

## 2015-10-29 NOTE — Telephone Encounter (Signed)
Left the patient a detailed message that the referral has been placed but a appt would be  Needed for the medication refill.

## 2015-10-29 NOTE — Telephone Encounter (Signed)
Patient called me back, she is unable to continue to pay the copays for visits with us and then a higher copay with the dermatologist .  Please advise for refill.

## 2015-10-29 NOTE — Telephone Encounter (Signed)
It has been over a month since her last evaluation. Unfortunately she needs evaluation. We can try to get her in to dermatology quickly for repeat evaluation. [facility].

## 2015-10-29 NOTE — Telephone Encounter (Signed)
I will place a referral to dermatology. Unfortunately will not prescribe prednisone without observing the rash again. If she would like a refill on that she should be scheduled in clinic.

## 2015-10-29 NOTE — Telephone Encounter (Signed)
Pt called about needing a refill for the medication that was given. Pt states she was told that if it went away to cancel appt pt states well now it's back. And now pt thinks she needs to go to the dermatologist. Pt states it's back. Medication is predniSONE (DELTASONE) 20 MG tablet. Pharmacy is [id number] [place] - [address] Pt would like to get another app[facility]. Call pt @ [phone]. Thank you!

## 2015-10-29 NOTE — Telephone Encounter (Signed)
Dr. Birdie SonsSonnenberg, you saw this patient last, please advise regarding refill and referral. thanks

## 2015-10-29 NOTE — Telephone Encounter (Signed)
I left patient  A detailed message that the refill was still denied, but that we are going to do our best to get her into dermatology this week.

## 2015-10-30 ENCOUNTER — Encounter: Payer: Self-pay | Admitting: Family

## 2015-10-30 ENCOUNTER — Ambulatory Visit (INDEPENDENT_AMBULATORY_CARE_PROVIDER_SITE_OTHER): Payer: 59 | Admitting: Family

## 2015-10-30 VITALS — BP 122/72 | HR 71 | Temp 97.8°F | Ht 66.0 in

## 2015-10-30 DIAGNOSIS — R21 Rash and other nonspecific skin eruption: Secondary | ICD-10-CM | POA: Diagnosis not present

## 2015-10-30 MED ORDER — PREDNISONE 10 MG PO TABS: ORAL_TABLET | ORAL | Status: DC

## 2015-10-30 NOTE — Progress Notes (Signed)
Subjective:    Patient ID: Anna Mcgee, female    DOB: 1961/05/13, 55 y.o.   MRN: [id number]   Anna Mcgee is a 55 y.o. female who presents today for an acute visit.    HPI Comments: Seen for rash 4/3 and given topical steriod thought to be fun hair product causing rash on back of neck. Stopped suspected hair product. Seen again 4/10 for rash and given oral prednisone with resolve. Rash has since recurred. Rash on neck, right upper arm, left lower leg. Pruritic. Has tried hydrocortisone cream and anti itch lotion at home with some relief. No fever, chills, nausea, vomiting.   Canceled derm appt because rash resolved; 'wants relief until can be seen'  No History of asthma, eczema, or allergies.   Works at [employer].   Past Medical History  Diagnosis Date  . Hypertension   . Vitamin D deficiency   . Postmenopausal bleeding   . History of anemia   . Vitamin D deficiency   . Heart murmur    Allergies: Penicillins Current Outpatient Prescriptions on File Prior to Visit  Medication Sig Dispense Refill  . buPROPion (WELLBUTRIN SR) 150 MG 12 hr tablet Take 1 tablet (150 mg total) by mouth 2 (two) times daily. 60 tablet 6  . valsartan-hydrochlorothiazide (DIOVAN-HCT) 160-25 MG per tablet Take 1 tablet by mouth daily.    . predniSONE (DELTASONE) 20 MG tablet Take 2 tablets (40 mg total) by mouth daily with breakfast. (Patient not taking: Reported on 10/30/2015) 10 tablet 0  . triamcinolone cream (KENALOG) 0.1 % Apply 1 application topically 2 (two) times daily. (Patient not taking: Reported on 10/30/2015) 30 g 0   No current facility-administered medications on file prior to visit.    Social History  Substance Use Topics  . Smoking status: Former Smoker    Types: Cigarettes    Quit date: 08/01/2010  . Smokeless tobacco: Never Used     Comment: Vapes  . Alcohol Use: 0.0 oz/week    0 Standard drinks or equivalent per week     Comment: Rare    Review of Systems    Constitutional: Negative for fever and chills.  Respiratory: Negative for cough.   Cardiovascular: Negative for chest pain and palpitations.  Gastrointestinal: Negative for nausea, vomiting and diarrhea.  Musculoskeletal: Negative for myalgias and arthralgias.  Skin: Positive for rash.      Objective:    BP 122/72 mmHg  Pulse 71  Temp(Src) 97.8 F (36.6 C) (Oral)  Ht  (1.676 m)  Wt   SpO2 97%   Physical Exam  Constitutional: She appears well-developed and well-nourished.  Eyes: Conjunctivae are normal.  Cardiovascular: Normal rate, regular rhythm, normal heart sounds and normal pulses.   Pulmonary/Chest: Effort normal and breath sounds normal. She has no wheezes. She has no rhonchi. She has no rales.  Neurological: She is alert.  Skin: Skin is warm and dry. Rash noted. Rash is nodular.  Several nodular discrete erythematous wheels noted left posterior neck, LLE. No draining, streaking.   Psychiatric: She has a normal mood and affect. Her speech is normal and behavior is normal. Thought content normal.  Vitals reviewed.      Assessment & Plan:   1. Rash and nonspecific skin eruption Uticaria is nonspecific at this time. No changes in lotions, detergents. Nodular. Pending appointment with dermatology. Patient looks well.    - predniSONE (DELTASONE) 10 MG tablet; Take 4 tablets (total 40 mg) PO x 2 days; take  3 tablets (total 30 mg) PO x 2 days; take 2 tablets (total 20 mg) PO x 1 day; take 1 tablet (total 10 mg) PO x 1 day.  Dispense: 17 tablet; Refill: 0    I am having Ms. Southern maintain her valsartan-hydrochlorothiazide, buPROPion, triamcinolone cream, predniSONE, and Diethylpropion HCl CR.   Meds ordered this encounter  Medications  . Diethylpropion HCl CR 75 MG TB24    Sig: Take 1 tablet by mouth daily.    Refill:  0     Start medications as prescribed and explained to patient on After Visit Summary ( AVS). Risks, benefits, and alternatives of the  medications and treatment plan prescribed today were discussed, and patient expressed understanding.   Education regarding symptom management and diagnosis given to patient.   Follow-up:Plan follow-up as discussed or as needed if any worsening symptoms or change in condition.   Continue to follow with Doss, Oleh Geninarrie M, NP for routine health maintenance.   Anna Rainierherry A Henningsen and I agreed with plan.   Rennie PlowmanMargaret Arnett, FNP

## 2015-10-30 NOTE — Patient Instructions (Signed)
Prednisone trial  If there is no improvement in your symptoms, or if there is any worsening of symptoms, or if you have any additional concerns, please return for re-evaluation; or, if we are closed, consider going to the Emergency Room for evaluation if symptoms urgent.  Contact Dermatitis Dermatitis is redness, soreness, and swelling (inflammation) of the skin. Contact dermatitis is a reaction to certain substances that touch the skin. There are two types of contact dermatitis:   Irritant contact dermatitis. This type is caused by something that irritates your skin, such as dry hands from washing them too much. This type does not require previous exposure to the substance for a reaction to occur. This type is more common.  Allergic contact dermatitis. This type is caused by a substance that you are allergic to, such as a nickel allergy or poison ivy. This type only occurs if you have been exposed to the substance (allergen) before. Upon a repeat exposure, your body reacts to the substance. This type is less common. CAUSES  Many different substances can cause contact dermatitis. Irritant contact dermatitis is most commonly caused by exposure to:   Makeup.   Soaps.   Detergents.   Bleaches.   Acids.   Metal salts, such as nickel.  Allergic contact dermatitis is most commonly caused by exposure to:   Poisonous plants.   Chemicals.   Jewelry.   Latex.   Medicines.   Preservatives in products, such as clothing.  RISK FACTORS This condition is more likely to develop in:   People who have jobs that expose them to irritants or allergens.  People who have certain medical conditions, such as asthma or eczema.  SYMPTOMS  Symptoms of this condition may occur anywhere on your body where the irritant has touched you or is touched by you. Symptoms include:  Dryness or flaking.   Redness.   Cracks.   Itching.   Pain or a burning feeling.    Blisters.  Drainage of small amounts of blood or clear fluid from skin cracks. With allergic contact dermatitis, there may also be swelling in areas such as the eyelids, mouth, or genitals.  DIAGNOSIS  This condition is diagnosed with a medical history and physical exam. A patch skin test may be performed to help determine the cause. If the condition is related to your job, you may need to see an occupational medicine specialist. TREATMENT Treatment for this condition includes figuring out what caused the reaction and protecting your skin from further contact. Treatment may also include:   Steroid creams or ointments. Oral steroid medicines may be needed in more severe cases.  Antibiotics or antibacterial ointments, if a skin infection is present.  Antihistamine lotion or an antihistamine taken by mouth to ease itching.  A bandage (dressing). HOME CARE INSTRUCTIONS Skin Care  Moisturize your skin as needed.   Apply cool compresses to the affected areas.  Try taking a bath with:  Epsom salts. Follow the instructions on the packaging. You can get these at your local pharmacy or grocery store.  Baking soda. Pour a small amount into the bath as directed by your health care provider.  Colloidal oatmeal. Follow the instructions on the packaging. You can get this at your local pharmacy or grocery store.  Try applying baking soda paste to your skin. Stir water into baking soda until it reaches a paste-like consistency.  Do not scratch your skin.  Bathe less frequently, such as every other day.  Bathe in lukewarm water.  Avoid using hot water. Medicines  Take or apply over-the-counter and prescription medicines only as told by your health care provider.   If you were prescribed an antibiotic medicine, take or apply your antibiotic as told by your health care provider. Do not stop using the antibiotic even if your condition starts to improve. General Instructions  Keep all  follow-up visits as told by your health care provider. This is important.  Avoid the substance that caused your reaction. If you do not know what caused it, keep a journal to try to track what caused it. Write down:  What you eat.  What cosmetic products you use.  What you drink.  What you wear in the affected area. This includes jewelry.  If you were given a dressing, take care of it as told by your health care provider. This includes when to change and remove it. SEEK MEDICAL CARE IF:   Your condition does not improve with treatment.  Your condition gets worse.  You have signs of infection such as swelling, tenderness, redness, soreness, or warmth in the affected area.  You have a fever.  You have new symptoms. SEEK IMMEDIATE MEDICAL CARE IF:   You have a severe headache, neck pain, or neck stiffness.  You vomit.  You feel very sleepy.  You notice red streaks coming from the affected area.  Your bone or joint underneath the affected area becomes painful after the skin has healed.  The affected area turns darker.  You have difficulty breathing.   This information is not intended to replace advice given to you by your health care provider. Make sure you discuss any questions you have with your health care provider.   Document Released: 05/30/2000 Document Revised: 02/21/2015 Document Reviewed: 10/18/2014 Elsevier Interactive Patient Education [employer]

## 2015-10-30 NOTE — Progress Notes (Signed)
Pre visit review using our clinic review tool, if applicable. No additional management support is needed unless otherwise documented below in the visit note. 

## 2015-10-31 NOTE — Telephone Encounter (Signed)
[facility] on [facility]. The local dermatologist are booking out till July/Aug.

## 2016-12-16 ENCOUNTER — Encounter: Payer: 59 | Admitting: Family

## 2017-01-12 ENCOUNTER — Ambulatory Visit (INDEPENDENT_AMBULATORY_CARE_PROVIDER_SITE_OTHER): Payer: 59 | Admitting: Family

## 2017-01-12 ENCOUNTER — Encounter: Payer: Self-pay | Admitting: Family

## 2017-01-12 VITALS — BP 142/88 | HR 75 | Temp 98.2°F | Ht 66.0 in

## 2017-01-12 DIAGNOSIS — F339 Major depressive disorder, recurrent, unspecified: Secondary | ICD-10-CM

## 2017-01-12 DIAGNOSIS — Z0001 Encounter for general adult medical examination with abnormal findings: Secondary | ICD-10-CM

## 2017-01-12 DIAGNOSIS — Z0000 Encounter for general adult medical examination without abnormal findings: Secondary | ICD-10-CM

## 2017-01-12 DIAGNOSIS — I10 Essential (primary) hypertension: Secondary | ICD-10-CM

## 2017-01-12 DIAGNOSIS — F32A Depression, unspecified: Secondary | ICD-10-CM | POA: Insufficient documentation

## 2017-01-12 MED ORDER — VALSARTAN-HYDROCHLOROTHIAZIDE 160-25 MG PO TABS: 1.0000 | ORAL_TABLET | Freq: Every day | ORAL | 0 refills | Status: DC

## 2017-01-12 MED ORDER — BUPROPION HCL ER (SR) 150 MG PO TB12: 150.0000 mg | ORAL_TABLET | Freq: Every morning | ORAL | 0 refills | Status: DC

## 2017-01-12 MED ORDER — BUPROPION HCL ER (XL) 300 MG PO TB24: 300.0000 mg | ORAL_TABLET | Freq: Every morning | ORAL | 0 refills | Status: DC

## 2017-01-12 NOTE — Patient Instructions (Addendum)
Start back on wellbutrin , take one 150mg  tablet in the morning for one week. The following week, take TWO tablets , total dose of 300mg , in the morning and stay there. The dose sent to [facility] is a 300mg  tablet which you just take ONE in the morning. Should be easier than the two.  Follow up in 6 weeks.   Ensure to keep following with [facility] for [facility].   3d mammogram.    [facility]  We placed a referral. Mammogram this year. I asked that you call one the below locations and schedule this when it is convenient for you.   If you have dense breasts, you may ask for 3D mammogram over the traditional 2D mammogram as new evidence suggest 3D is superior. Please note that NOT all insurance companies cover 3D and you may have to pay a higher copay. You may call your insurance company to further clarify your benefits.   Options for Mammogram.    [facility]  [address]  [place], [place]  [phone]  * [facility]*   [facility] [address] [place], [place] [phone] * Note if you ask for 3D mammogram at this location, you must request [place], [place] location*

## 2017-01-12 NOTE — Progress Notes (Signed)
Pre visit review using our clinic review tool, if applicable. No additional management support is needed unless otherwise documented below in the visit note. 

## 2017-01-12 NOTE — Assessment & Plan Note (Signed)
Mildly elevated today. The patient has been off medications for 3 days. Encouraged her to stay consistent. We'll follow.

## 2017-01-12 NOTE — Assessment & Plan Note (Signed)
Worsening. We'll restart Wellbutrin titrating up to 300mg  qam;  advised patient follow-up in 6 weeks.

## 2017-01-12 NOTE — Assessment & Plan Note (Signed)
Ordered cologuard as patient is low risk. Order sleep study due to history of snoring. Labs ordered including HIV ,hepatitis C and consented for. Patient due for mammogram and advised her to do 3-D. She understands to schedule mammogram. Encouraged exercise. CT chest ordered due to history of smoking .

## 2017-01-12 NOTE — Progress Notes (Signed)
Subjective:    Patient ID: Anna Mcgee, female    DOB: 01/30/1961, 56 y.o.   MRN: [id number]  CC: Anna RainierCherry A Hieronymus is a 56 y.o. female who presents today for physical exam.    HPI:  HTN- usually compliant with medications. Hadnt taken medication over weekend Denies exertional chest pain or pressure, numbness or tingling radiating to left arm or jaw, palpitations, dizziness, frequent headaches, changes in vision, or shortness of breath.   Depression- stopped wellbutrin, ran out of medication. 'Not myself.' Not as social. Not tearful. No thoughts of hurting herself or anyone else.   She also states that she is snoring, wakes herself up at night      Colorectal Cancer Screening: due; would like to Cologuard Breast Cancer Screening: Mammogram due Cervical Cancer Screening: follows with OB GYN. Appears due, patient states UTD. No pelvic complaints today Bone Health screening/DEXA for 65+: No increased fracture risk. Defer screening at this time. Lung Cancer Screening: Doesn't have 30 year pack year history and age > 55 years. Smoked for 20 years, stopped 5 years ago.    Immunizations       Tetanus - due Hepatitis C screening - Candidate for, consent. HIV Screening- Candidate for consent Labs: Screening labs today. Exercise: Gets regular exercise.  Alcohol use: Rare Smoking/tobacco use: former smoker.  Regular dental exams: UTD Wears seat belt: Yes. Skin: no new skin lesions  HISTORY:  Past Medical History:  Diagnosis Date  . Heart murmur   . History of anemia   . Hypertension   . Postmenopausal bleeding   . Vitamin D deficiency   . Vitamin D deficiency     Past Surgical History:  Procedure Laterality Date  . GASTRIC BYPASS  2006   Family History  Problem Relation Age of Onset  . Hypertension Mother   . Hypertension Father   . Arthritis Maternal Grandmother   . Colon cancer Neg Hx       ALLERGIES: Penicillins  Current Outpatient Prescriptions on File Prior  to Visit  Medication Sig Dispense Refill  . Diethylpropion HCl CR 75 MG TB24 Take 1 tablet by mouth daily.  0  . predniSONE (DELTASONE) 10 MG tablet Take 4 tablets (total 40 mg) PO x 2 days; take 3 tablets (total 30 mg) PO x 2 days; take 2 tablets (total 20 mg) PO x 1 day; take 1 tablet (total 10 mg) PO x 1 day. 17 tablet 0  . triamcinolone cream (KENALOG) 0.1 % Apply 1 application topically 2 (two) times daily. (Patient not taking: Reported on 10/30/2015) 30 g 0   No current facility-administered medications on file prior to visit.     Social History  Substance Use Topics  . Smoking status: Former Smoker    Types: Cigarettes    Quit date: 08/01/2010  . Smokeless tobacco: Never Used     Comment: Vapes  . Alcohol use 0.0 oz/week     Comment: Rare    Review of Systems  Constitutional: Negative for chills, fever and unexpected weight change.  HENT: Negative for congestion.   Respiratory: Negative for cough.   Cardiovascular: Negative for chest pain, palpitations and leg swelling.  Gastrointestinal: Negative for nausea and vomiting.  Genitourinary: Negative for dyspareunia, pelvic pain and vaginal discharge.  Musculoskeletal: Negative for arthralgias and myalgias.  Skin: Negative for rash.  Neurological: Negative for headaches.  Hematological: Negative for adenopathy.  Psychiatric/Behavioral: Negative for confusion, sleep disturbance and suicidal ideas.      Objective:  BP (!) 142/88   Pulse 75   Temp 98.2 F (36.8 C) (Oral)   Ht 5\' 6"  (1.676 m)   SpO2 95%   BP Readings from Last 3 Encounters:  01/12/17 (!) 142/88  10/30/15 122/72  09/24/15 128/74   Wt Readings from Last 3 Encounters:  No data found for Wt    Physical Exam  Constitutional: She appears well-developed and well-nourished.  Eyes: Conjunctivae are normal.  Neck: No thyroid mass and no thyromegaly present.  Cardiovascular: Normal rate, regular rhythm, normal heart sounds and normal pulses.     Pulmonary/Chest: Effort normal and breath sounds normal. She has no wheezes. She has no rhonchi. She has no rales. Right breast exhibits no inverted nipple, no mass, no nipple discharge, no skin change and no tenderness. Left breast exhibits no inverted nipple, no mass, no nipple discharge, no skin change and no tenderness. Breasts are symmetrical.  CBE performed.   Lymphadenopathy:       Head (right side): No submental, no submandibular, no tonsillar, no preauricular, no posterior auricular and no occipital adenopathy present.       Head (left side): No submental, no submandibular, no tonsillar, no preauricular, no posterior auricular and no occipital adenopathy present.    She has no cervical adenopathy.       Right cervical: No superficial cervical, no deep cervical and no posterior cervical adenopathy present.      Left cervical: No superficial cervical, no deep cervical and no posterior cervical adenopathy present.    She has no axillary adenopathy.  Neurological: She is alert.  Skin: Skin is warm and dry.  Psychiatric: She has a normal mood and affect. Her speech is normal and behavior is normal. Thought content normal.  Vitals reviewed.      Assessment & Plan:   Problem List Items Addressed This Visit      Cardiovascular and Mediastinum   HTN (hypertension)    Mildly elevated today. The patient has been off medications for 3 days. Encouraged her to stay consistent. We'll follow.      Relevant Medications   valsartan-hydrochlorothiazide (DIOVAN-HCT) 160-25 MG tablet     Other   Routine physical examination - Primary    Ordered cologuard as patient is low risk. Order sleep study due to history of snoring. Labs ordered including HIV ,hepatitis C and consented for. Patient due for mammogram and advised her to do 3-D. She understands to schedule mammogram. Encouraged exercise. CT chest ordered due to history of smoking .      Relevant Medications   buPROPion (WELLBUTRIN SR) 150  MG 12 hr tablet   valsartan-hydrochlorothiazide (DIOVAN-HCT) 160-25 MG tablet   Other Relevant Orders   MM SCREENING BREAST TOMO BILATERAL   Ambulatory referral to Gastroenterology   Ambulatory referral to Sleep Studies   CT CHEST LUNG CANCER SCREENING LOW DOSE WO CONTRAST   CBC with Differential/Platelet   Comprehensive metabolic panel   Hemoglobin A1c   Hepatitis C antibody   HIV antibody   Lipid panel   TSH   VITAMIN D 25 Hydroxy (Vit-D Deficiency, Fractures)   B12 and Folate Panel   Depression, recurrent (HCC)    Worsening. We'll restart Wellbutrin titrating up to 300mg  qam;  advised patient follow-up in 6 weeks.      Relevant Medications   buPROPion (WELLBUTRIN SR) 150 MG 12 hr tablet   buPROPion (WELLBUTRIN XL) 300 MG 24 hr tablet       I have changed Ms. Bazinet's buPROPion.  I am also having her start on buPROPion. Additionally, I am having her maintain her triamcinolone cream, Diethylpropion HCl CR, predniSONE, and valsartan-hydrochlorothiazide.   Meds ordered this encounter  Medications  . buPROPion (WELLBUTRIN SR) 150 MG 12 hr tablet    Sig: Take 1 tablet (150 mg total) by mouth every morning.    Dispense:  30 tablet    Refill:  0    Order Specific Question:   Supervising Provider    Answer:   Duncan Dull L [[id number]]  . DISCONTD: valsartan-hydrochlorothiazide (DIOVAN-HCT) 160-25 MG tablet    Sig: Take 1 tablet by mouth daily.    Dispense:  30 tablet    Refill:  0    Order Specific Question:   Supervising Provider    Answer:   Duncan Dull L [[id number]]  . valsartan-hydrochlorothiazide (DIOVAN-HCT) 160-25 MG tablet    Sig: Take 1 tablet by mouth daily.    Dispense:  90 tablet    Refill:  0    Order Specific Question:   Supervising Provider    Answer:   Duncan Dull L [[id number]]  . buPROPion (WELLBUTRIN XL) 300 MG 24 hr tablet    Sig: Take 1 tablet (300 mg total) by mouth every morning.    Dispense:  90 tablet    Refill:  0    Order Specific Question:    Supervising Provider    Answer:   Sherlene Shams [[id number]]    Return precautions given.   Risks, benefits, and alternatives of the medications and treatment plan prescribed today were discussed, and patient expressed understanding.   Education regarding symptom management and diagnosis given to patient on AVS.   Continue to follow with Allegra Grana, FNP for routine health maintenance.   Anna Rainier and I agreed with plan.   Rennie Plowman, FNP

## 2017-01-15 ENCOUNTER — Telehealth: Payer: Self-pay | Admitting: *Deleted

## 2017-01-15 NOTE — Telephone Encounter (Signed)
Received referral for low dose lung cancer screening CT scan. Message left at phone number listed in EMR for patient to call me back to facilitate scheduling scan.  

## 2017-01-20 ENCOUNTER — Telehealth: Payer: Self-pay | Admitting: *Deleted

## 2017-01-20 NOTE — Telephone Encounter (Signed)
Received referral for low dose lung cancer screening CT scan. Message left at phone number listed in EMR for patient to call me back to facilitate scheduling scan.  

## 2017-02-03 LAB — CBC WITH DIFFERENTIAL/PLATELET
Basophils Absolute: 0 10*3/uL (ref 0.0–0.2)
Basos: 1 %
EOS (ABSOLUTE): 0 10*3/uL (ref 0.0–0.4)
Eos: 1 %
Hematocrit: 34.8 % (ref 34.0–46.6)
Hemoglobin: 11.3 g/dL (ref 11.1–15.9)
IMMATURE GRANULOCYTES: 0 %
Immature Grans (Abs): 0 10*3/uL (ref 0.0–0.1)
LYMPHS ABS: 1.8 10*3/uL (ref 0.7–3.1)
Lymphs: 34 %
MCH: 28.2 pg (ref 26.6–33.0)
MCHC: 32.5 g/dL (ref 31.5–35.7)
MCV: 87 fL (ref 79–97)
MONOS ABS: 0.4 10*3/uL (ref 0.1–0.9)
Monocytes: 8 %
NEUTROS PCT: 56 %
Neutrophils Absolute: 3 10*3/uL (ref 1.4–7.0)
PLATELETS: 341 10*3/uL (ref 150–379)
RBC: 4.01 x10E6/uL (ref 3.77–5.28)
RDW: 14.7 % (ref 12.3–15.4)
WBC: 5.3 10*3/uL (ref 3.4–10.8)

## 2017-02-03 LAB — COMPREHENSIVE METABOLIC PANEL
ALK PHOS: 71 IU/L (ref 39–117)
ALT: 15 IU/L (ref 0–32)
AST: 25 IU/L (ref 0–40)
Albumin/Globulin Ratio: 1.7 (ref 1.2–2.2)
Albumin: 4.3 g/dL (ref 3.5–5.5)
BUN/Creatinine Ratio: 18 (ref 9–23)
BUN: 16 mg/dL (ref 6–24)
Bilirubin Total: <0.2 mg/dL (ref 0.0–1.2)
CALCIUM: 9.1 mg/dL (ref 8.7–10.2)
CO2: 21 mmol/L (ref 20–29)
Chloride: 106 mmol/L (ref 96–106)
Creatinine, Ser: 0.89 mg/dL (ref 0.57–1.00)
GFR calc Af Amer: 83 mL/min/{1.73_m2} (ref >59)
GFR, EST NON AFRICAN AMERICAN: 72 mL/min/{1.73_m2} (ref >59)
Globulin, Total: 2.6 g/dL (ref 1.5–4.5)
Glucose: 78 mg/dL (ref 65–99)
POTASSIUM: 3.9 mmol/L (ref 3.5–5.2)
Sodium: 140 mmol/L (ref 134–144)
Total Protein: 6.9 g/dL (ref 6.0–8.5)

## 2017-02-03 LAB — VITAMIN D 25 HYDROXY (VIT D DEFICIENCY, FRACTURES): VIT D 25 HYDROXY: 18.9 ng/mL — AB (ref 30.0–100.0)

## 2017-02-03 LAB — LIPID PANEL
Chol/HDL Ratio: 1.7 ratio (ref 0.0–4.4)
Cholesterol, Total: 172 mg/dL (ref 100–199)
HDL: 100 mg/dL (ref >39)
LDL Calculated: 64 mg/dL (ref 0–99)
TRIGLYCERIDES: 39 mg/dL (ref 0–149)
VLDL Cholesterol Cal: 8 mg/dL (ref 5–40)

## 2017-02-03 LAB — TSH: TSH: 0.822 u[IU]/mL (ref 0.450–4.500)

## 2017-02-03 LAB — HEPATITIS C ANTIBODY

## 2017-02-03 LAB — B12 AND FOLATE PANEL
Folate: 16.7 ng/mL (ref >3.0)
Vitamin B-12: 458 pg/mL (ref 232–1245)

## 2017-02-03 LAB — HEMOGLOBIN A1C
ESTIMATED AVERAGE GLUCOSE: 120 mg/dL
HEMOGLOBIN A1C: 5.8 % — AB (ref 4.8–5.6)

## 2017-02-03 LAB — HIV ANTIBODY (ROUTINE TESTING W REFLEX): HIV Screen 4th Generation wRfx: NONREACTIVE

## 2017-02-06 ENCOUNTER — Encounter: Payer: Self-pay | Admitting: *Deleted

## 2017-02-10 LAB — COLOGUARD: Cologuard: NEGATIVE

## 2017-02-15 ENCOUNTER — Other Ambulatory Visit: Payer: Self-pay | Admitting: Family

## 2017-02-15 DIAGNOSIS — Z0000 Encounter for general adult medical examination without abnormal findings: Secondary | ICD-10-CM

## 2017-02-17 ENCOUNTER — Telehealth: Payer: Self-pay | Admitting: Family

## 2017-02-17 ENCOUNTER — Telehealth: Payer: Self-pay | Admitting: *Deleted

## 2017-02-17 DIAGNOSIS — F339 Major depressive disorder, recurrent, unspecified: Secondary | ICD-10-CM

## 2017-02-17 MED ORDER — BUPROPION HCL ER (XL) 300 MG PO TB24: 300.0000 mg | ORAL_TABLET | Freq: Every morning | ORAL | 0 refills | Status: DC

## 2017-02-17 NOTE — Telephone Encounter (Signed)
Pt was informed of Anna Mcgee message. Pt understood

## 2017-02-17 NOTE — Telephone Encounter (Signed)
Call pt     let  know Cologuard NEGATIVE which indicates lower likelihood of cancer or pre cancer. You will need to repeat test in 3 years or sooner if any symptoms including blood in stool, change in bowel habits.   

## 2017-02-17 NOTE — Telephone Encounter (Signed)
Left message for patient to return call.

## 2017-02-17 NOTE — Telephone Encounter (Signed)
Pt requested a medication refill for Wellbutrin

## 2017-02-25 ENCOUNTER — Ambulatory Visit: Payer: 59 | Admitting: Family

## 2017-03-04 ENCOUNTER — Ambulatory Visit (INDEPENDENT_AMBULATORY_CARE_PROVIDER_SITE_OTHER): Payer: 59 | Admitting: Family

## 2017-03-04 ENCOUNTER — Encounter: Payer: Self-pay | Admitting: Family

## 2017-03-04 DIAGNOSIS — I10 Essential (primary) hypertension: Secondary | ICD-10-CM

## 2017-03-04 DIAGNOSIS — F339 Major depressive disorder, recurrent, unspecified: Secondary | ICD-10-CM | POA: Diagnosis not present

## 2017-03-04 NOTE — Patient Instructions (Addendum)
Pleasure always seeing you!  Let me know how the wellbutrin goes.

## 2017-03-04 NOTE — Assessment & Plan Note (Signed)
Very pleased depression is improved. Patient will start taking 300 mg dose of wellbutrin and let me know how this goes as well. Will follow.

## 2017-03-04 NOTE — Assessment & Plan Note (Signed)
Well controlled. Continue current regimen. Follow up in  3 months.  

## 2017-03-04 NOTE — Progress Notes (Signed)
Subjective:    Patient ID: Anna Mcgee, female    DOB: 08/19/1960, 56 y.o.   MRN: [id number]  CC: Anna Mcgee is a 56 y.o. female who presents today for follow up.   HPI: HTN- compliant with medications. Denies exertional chest pain or pressure, numbness or tingling radiating to left arm or jaw, palpitations, dizziness, frequent headaches, changes in vision, or shortness of breath.   Depression- feeling better on wellbutrin. Will try the  qam dose. No thoughts of hurting herself or anyone else. Feeling more social.         HISTORY:  Past Medical History:  Diagnosis Date  . Heart murmur   . History of anemia   . Hypertension   . Postmenopausal bleeding   . Vitamin D deficiency   . Vitamin D deficiency    Past Surgical History:  Procedure Laterality Date  . GASTRIC BYPASS  2006   Family History  Problem Relation Age of Onset  . Hypertension Mother   . Hypertension Father   . Arthritis Maternal Grandmother   . Colon cancer Neg Hx     Allergies: Penicillins Current Outpatient Prescriptions on File Prior to Visit  Medication Sig Dispense Refill  . buPROPion (WELLBUTRIN XL) 300 MG 24 hr tablet Take 1 tablet (300 mg total) by mouth every morning. 90 tablet 0  . Diethylpropion HCl CR 75 MG TB24 Take 1 tablet by mouth daily.  0  . predniSONE (DELTASONE) 10 MG tablet Take 4 tablets (total 40 mg) PO x 2 days; take 3 tablets (total 30 mg) PO x 2 days; take 2 tablets (total 20 mg) PO x 1 day; take 1 tablet (total 10 mg) PO x 1 day. 17 tablet 0  . triamcinolone cream (KENALOG) 0.1 % Apply 1 application topically 2 (two) times daily. 30 g 0  . valsartan-hydrochlorothiazide (DIOVAN-HCT) 160-25 MG tablet Take 1 tablet by mouth daily. 90 tablet 0  . valsartan-hydrochlorothiazide (DIOVAN-HCT) 160-25 MG tablet TAKE 1 TABLET BY MOUTH EVERY DAY 30 tablet 0   No current facility-administered medications on file prior to visit.     Social History  Substance Use Topics  .  Smoking status: Former Smoker    Types: Cigarettes    Quit date: 08/01/2010  . Smokeless tobacco: Never Used     Comment: Vapes  . Alcohol use 0.0 oz/week     Comment: Rare    Review of Systems  Constitutional: Negative for chills and fever.  Respiratory: Negative for cough.   Cardiovascular: Negative for chest pain and palpitations.  Gastrointestinal: Negative for nausea and vomiting.      Objective:    BP 124/84   Pulse 75   Temp 98.2 F (36.8 C) (Oral)   Ht  (1.676 m)   SpO2 95%  BP Readings from Last 3 Encounters:  03/04/17 124/84  01/12/17 (!) 142/88  10/30/15 122/72   Wt Readings from Last 3 Encounters:  No data found for Wt    Physical Exam  Constitutional: She appears well-developed and well-nourished.  Eyes: Conjunctivae are normal.  Cardiovascular: Normal rate, regular rhythm, normal heart sounds and normal pulses.   Pulmonary/Chest: Effort normal and breath sounds normal. She has no wheezes. She has no rhonchi. She has no rales.  Neurological: She is alert.  Skin: Skin is warm and dry.  Psychiatric: She has a normal mood and affect. Her speech is normal and behavior is normal. Thought content normal.  Vitals reviewed.  Assessment & Plan:   Problem List Items Addressed This Visit      Cardiovascular and Mediastinum   HTN (hypertension)    Well-controlled. Continue current regimen. Follow-up in 3 months.        Other   Depression, recurrent (HCC)    Very pleased depression is improved. Patient will start taking 300 mg dose of wellbutrin and let me know how this goes as well. Will follow.          I am having Anna Mcgee maintain her triamcinolone cream, Diethylpropion HCl CR, predniSONE, valsartan-hydrochlorothiazide, valsartan-hydrochlorothiazide, and buPROPion.   No orders of the defined types were placed in this encounter.   Return precautions given.   Risks, benefits, and alternatives of the medications and treatment plan  prescribed today were discussed, and patient expressed understanding.   Education regarding symptom management and diagnosis given to patient on AVS.  Continue to follow with Anna Grana, FNP for routine health maintenance.   Anna Mcgee and I agreed with plan.   Anna Plowman, FNP

## 2017-04-10 ENCOUNTER — Other Ambulatory Visit: Payer: Self-pay | Admitting: Family

## 2017-04-10 DIAGNOSIS — F339 Major depressive disorder, recurrent, unspecified: Secondary | ICD-10-CM

## 2017-04-10 DIAGNOSIS — Z0000 Encounter for general adult medical examination without abnormal findings: Secondary | ICD-10-CM

## 2017-05-15 ENCOUNTER — Telehealth: Payer: Self-pay | Admitting: Family

## 2017-05-15 DIAGNOSIS — F339 Major depressive disorder, recurrent, unspecified: Secondary | ICD-10-CM

## 2017-05-15 DIAGNOSIS — Z0000 Encounter for general adult medical examination without abnormal findings: Secondary | ICD-10-CM

## 2017-05-15 MED ORDER — VALSARTAN-HYDROCHLOROTHIAZIDE 160-25 MG PO TABS: 1.0000 | ORAL_TABLET | Freq: Every day | ORAL | 1 refills | Status: DC

## 2017-05-15 MED ORDER — BUPROPION HCL ER (XL) 300 MG PO TB24: 300.0000 mg | ORAL_TABLET | Freq: Every morning | ORAL | 0 refills | Status: DC

## 2017-05-15 NOTE — Telephone Encounter (Signed)
[facility]

## 2017-05-15 NOTE — Telephone Encounter (Signed)
Copied from CRM [id number]. Topic: Quick Communication - See Telephone Encounter >> May 15, 2017  9:05 AM Herby AbrahamJohnson, Shiquita C wrote: CRM for notification. See Telephone encounter for:  05/15/17.    Pt called in to provide the phone number to mail order ([facility]). Pt says that they are stating that she have to have a MD sign off on her medications requested, DIOVAN-HCT and also Wellbutrin XL   Please advise.   Phone # : [phone]

## 2017-05-18 ENCOUNTER — Ambulatory Visit: Payer: Self-pay | Admitting: *Deleted

## 2017-05-18 MED ORDER — VALSARTAN-HYDROCHLOROTHIAZIDE 80-12.5 MG PO TABS: 1.0000 | ORAL_TABLET | Freq: Every day | ORAL | 0 refills | Status: DC

## 2017-05-18 NOTE — Telephone Encounter (Signed)
  Reason for Disposition . Taking a medicine that could cause dizziness (e.g., blood pressure medications, diuretics)  Answer Assessment - Initial Assessment Questions 1. DESCRIPTION: "Describe your dizziness."     Feels lightheaded 2. LIGHTHEADED: "Do you feel lightheaded?" (e.g., somewhat faint, woozy, weak upon standing)     Worse with standing 3. VERTIGO: "Do you feel like either you or the room is spinning or tilting?" (i.e. vertigo)    No 4. SEVERITY: "How bad is it?"  "Do you feel like you are going to faint?" "Can you stand and walk?"   - MILD - walking normally   - MODERATE - interferes with normal activities (e.g., work, school)    - SEVERE - unable to stand, requires support to walk, feels like passing out now.      moderate 5. ONSET:  "When did the dizziness begin?"     On and for the past month 6. AGGRAVATING FACTORS: "Does anything make it worse?" (e.g., standing, change in head position)     Standing 7. HEART RATE: "Can you tell me your heart rate?" "How many beats in 15 seconds?"  (Note: not all patients can do this)       60bpm 8. CAUSE: "What do you think is causing the dizziness?"    BP medications 9. RECURRENT SYMPTOM: "Have you had dizziness before?" If so, ask: "When was the last time?" "What happened that time?"     Pt states she is unsure, but she thinks she has in the past but not as bad as today 10. OTHER SYMPTOMS: "Do you have any other symptoms?" (e.g., fever, chest pain, vomiting, diarrhea, bleeding)       No 11. PREGNANCY: "Is there any chance you are pregnant?" "When was your last menstrual period?"       No  Protocols used: DIZZINESS - LIGHTHEADEDNESS-A-AH  Pt states she has been experiencing dizziness after taking Diovan for approximately a month that has become worse. Pt states that she has lost about 30lbs in the past month and does not know if the current dose of medication is now too strong for her due to recent weight loss. Pt's current  prescription is to take one tab daily, but the pt has been taking 1/2tab on her own due to dizziness accompanied with taking a whole tablet of the medication.Pt has an appt scheduled for  Monday, 12/10. Pt offered to make appt at another [facility] practice for a sooner appt time, but pt states she does not want to go to [place]. Pt advised if symptoms worsen to contact Urgent Care for evaluation. Pt verbalized understanding.

## 2017-05-18 NOTE — Telephone Encounter (Signed)
Patient is aware. Patient stated she wanted to use the ones she had first because she had a lot left.

## 2017-05-18 NOTE — Telephone Encounter (Signed)
[place] of   valsartan/hct sent to [facility] [place]

## 2017-05-18 NOTE — Telephone Encounter (Signed)
FYI

## 2017-05-25 ENCOUNTER — Ambulatory Visit: Payer: 59 | Admitting: Family Medicine

## 2017-07-03 ENCOUNTER — Other Ambulatory Visit: Payer: Self-pay | Admitting: Family

## 2017-07-03 DIAGNOSIS — F339 Major depressive disorder, recurrent, unspecified: Secondary | ICD-10-CM

## 2017-07-23 ENCOUNTER — Ambulatory Visit: Payer: 59 | Admitting: Family Medicine

## 2017-07-23 ENCOUNTER — Encounter: Payer: Self-pay | Admitting: Family Medicine

## 2017-07-23 ENCOUNTER — Other Ambulatory Visit: Payer: Self-pay

## 2017-07-23 VITALS — BP 120/78 | HR 87 | Temp 98.5°F | Wt 204.0 lb

## 2017-07-23 DIAGNOSIS — J069 Acute upper respiratory infection, unspecified: Secondary | ICD-10-CM

## 2017-07-23 NOTE — Progress Notes (Signed)
Patient ID: Anna Mcgee, female   DOB: 01/02/1961, 57 y.o.   MRN: [id number]  PCP: Allegra GranaArnett, Margaret G, FNP  Subjective:  Anna Mcgee is a 57 y.o. year old very pleasant female patient who presents with Upper Respiratory infection symptoms including nasal congestion, mild sore throat, sinus pressure/pain,  cough that is minimally productive of yellow sputum -started: 3 days ago , symptoms are not worsening but are not improving -previous treatments: cough syrup and azelastine have provided moderate benefit -sick contacts/travel/risks: denies flu exposure. No recent sick contact exposure however she works in a lab with patients -Hx of: allergies No influenza vaccine this season Former smoker; Vapes daily  ROS-denies fever, SOB, NVD, tooth pain  Pertinent Past Medical History- HTN, Gastric bypass  Medications- reviewed  Current Outpatient Medications  Medication Sig Dispense Refill  . buPROPion (WELLBUTRIN XL) 300 MG 24 hr tablet TAKE 1 TABLET BY MOUTH  EVERY MORNING 90 tablet 1  . valsartan-hydrochlorothiazide (DIOVAN-HCT) 80-12.5 MG tablet Take 1 tablet by mouth daily. 30 tablet 0   No current facility-administered medications for this visit.     Objective: BP 120/78   Pulse 87   Temp 98.5 F (36.9 C) (Oral)   Wt 204 lb (92.5 kg)   SpO2 98%   BMI 32.93 kg/m  Gen: NAD, resting comfortably HEENT: Turbinates erythematous, TMs normal, pharynx mildly erythematous with no tonsilar exudate or edema, no sinus tenderness CV: RRR no murmurs rubs or gallops Lungs: CTAB no crackles, wheeze, rhonchi Abdomen: soft/nontender/nondistended/normal bowel sounds. No rebound or guarding.  Ext: no edema Skin: warm, dry, no rash Neuro: grossly normal, moves all extremities  Assessment/Plan:  Upper Respiratory infection History and exam today are suggestive of viral infection most likely due to upper respiratory infection. Symptomatic treatment with: acetaminophen and either allegra,  claritin, or zyrtec for post nasal drip. She will use azelastine, and also  Mucinex DM for cough as needed.  We discussed that we did not find any infection that had higher probability of being bacterial such as pneumonia or strep throat. We discussed signs that bacterial infection may have developed particularly fever or shortness of breath. Likely course of 1-2 weeks. Patient is contagious and advised good handwashing and consideration of mask If going to be in public places.   Finally, we reviewed reasons to return to care including if symptoms worsen or persist or new concerns arise- once again particularly shortness of breath or fever.    Anna CatalinaJulia Ann Joylyn Duggin, FNP

## 2017-07-23 NOTE — Patient Instructions (Addendum)
Please try either Allegra, Claritin, or Zyrtec for post nasal drip and you can continue nasal spray that was given to you by your provider.   Your symptoms today are most likely caused by viral illness. Please drink plenty of water enough for your urine to be pale yellow or clear. You may use Tylenol 325 mg every 6 hours as needed. Mucinex DM can be used for cough. Follow-up for evaluation if your symptoms do not improve in 3-4 days, worsen, or you develop a fever greater than 100.  Upper Respiratory Infection, Adult Most upper respiratory infections (URIs) are caused by a virus. A URI affects the nose, throat, and upper air passages. The most common type of URI is often called "the common cold." Follow these instructions at home:  Take medicines only as told by your doctor.  Gargle warm saltwater or take cough drops to comfort your throat as told by your doctor.  Use a warm mist humidifier or inhale steam from a shower to increase air moisture. This may make it easier to breathe.  Drink enough fluid to keep your pee (urine) clear or pale yellow.  Eat soups and other clear broths.  Have a healthy diet.  Rest as needed.  Go back to work when your fever is gone or your doctor says it is okay. ? You may need to stay home longer to avoid giving your URI to others. ? You can also wear a face mask and wash your hands often to prevent spread of the virus.  Use your inhaler more if you have asthma.  Do not use any tobacco products, including cigarettes, chewing tobacco, or electronic cigarettes. If you need help quitting, ask your doctor. Contact a doctor if:  You are getting worse, not better.  Your symptoms are not helped by medicine.  You have chills.  You are getting more short of breath.  You have brown or red mucus.  You have yellow or brown discharge from your nose.  You have pain in your face, especially when you bend forward.  You have a fever.  You have puffy  (swollen) neck glands.  You have pain while swallowing.  You have white areas in the back of your throat. Get help right away if:  You have very bad or constant: ? Headache. ? Ear pain. ? Pain in your forehead, behind your eyes, and over your cheekbones (sinus pain). ? Chest pain.  You have long-lasting (chronic) lung disease and any of the following: ? Wheezing. ? Long-lasting cough. ? Coughing up blood. ? A change in your usual mucus.  You have a stiff neck.  You have changes in your: ? Vision. ? Hearing. ? Thinking. ? Mood. This information is not intended to replace advice given to you by your health care provider. Make sure you discuss any questions you have with your health care provider. Document Released: 11/19/2007 Document Revised: 02/03/2016 Document Reviewed: 09/07/2013 Elsevier Interactive Patient Education  2018 [employer]

## 2017-07-30 ENCOUNTER — Telehealth: Payer: Self-pay | Admitting: Family

## 2017-07-30 NOTE — Telephone Encounter (Signed)
Call  Following up as appears Anna Mcgee has been trying to reach her for CT chest lung cancer screen.  If she is still interested, let us know and I will re order for her.

## 2017-08-03 NOTE — Telephone Encounter (Signed)
Pt declining right now. She may decide to when she is due to come back into the office.

## 2017-08-03 NOTE — Telephone Encounter (Signed)
Left voice mail to call back ok for [facility] to speak to patient 

## 2017-08-04 NOTE — Telephone Encounter (Signed)
noted 

## 2017-09-09 ENCOUNTER — Telehealth: Payer: Self-pay | Admitting: Family

## 2017-09-09 NOTE — Telephone Encounter (Signed)
[facility]   [facility] well.   Your mammogram appears due.   Please schedule a follow up appointment with me if you would like for me to order .  Look forward to seeing you.  Rory PercyBest,   Adhira Jamil, NP

## 2017-09-11 ENCOUNTER — Other Ambulatory Visit: Payer: Self-pay | Admitting: *Deleted

## 2017-09-11 ENCOUNTER — Telehealth: Payer: Self-pay | Admitting: Family

## 2017-09-11 DIAGNOSIS — F339 Major depressive disorder, recurrent, unspecified: Secondary | ICD-10-CM

## 2017-09-11 MED ORDER — BUPROPION HCL ER (XL) 300 MG PO TB24: 300.0000 mg | ORAL_TABLET | Freq: Every morning | ORAL | 1 refills | Status: DC

## 2017-09-11 NOTE — Telephone Encounter (Signed)
Copied from CRM [id number]. Topic: Quick Communication - See Telephone Encounter >> Sep 11, 2017 12:22 PM Windy KalataMichael, Denishia Citro L, NT wrote: CRM for notification. See Telephone encounter for: 09/11/17.  Patient is calling and states [facility] has tried to reach the office multiple times and can not get a reply on a refill for buPROPion (WELLBUTRIN XL) 300 MG 24 hr tablet  please advise.   [facility] [place] - [id number]  [address] [place] [address] [place] [id number]  Phone: [phone] Fax: [phone]

## 2017-09-15 NOTE — Telephone Encounter (Signed)
Letter sent.

## 2017-12-15 ENCOUNTER — Telehealth: Payer: Self-pay | Admitting: Family

## 2017-12-15 ENCOUNTER — Other Ambulatory Visit: Payer: Self-pay

## 2017-12-15 DIAGNOSIS — F339 Major depressive disorder, recurrent, unspecified: Secondary | ICD-10-CM

## 2017-12-15 MED ORDER — BUPROPION HCL ER (XL) 300 MG PO TB24: 300.0000 mg | ORAL_TABLET | Freq: Every morning | ORAL | 0 refills | Status: DC

## 2017-12-15 NOTE — Telephone Encounter (Signed)
Copied from CRM [phone]. Topic: Quick Communication - Rx Refill/Question >> Dec 15, 2017  8:48 AM Gloriann LoanPayne, Diala Waxman L wrote: Medication:    buPROPion (WELLBUTRIN XL) 300 MG 24 hr tablet   until appt on 01-13-18  Has the patient contacted their pharmacy? Yes.   (Agent: If no, request that the patient contact the pharmacy for the refill.) (Agent: If yes, when and what did the pharmacy advise?)  Preferred Pharmacy (with phone number or street name): [id number] [place], [place] - [address] [phone] (Phone) [phone] (Fax)      Agent: Please be advised that RX refills may take up to 3 business days. We ask that you follow-up with your pharmacy.

## 2018-01-13 ENCOUNTER — Encounter: Payer: Self-pay | Admitting: Family

## 2018-01-13 ENCOUNTER — Ambulatory Visit: Payer: 59 | Admitting: Family

## 2018-01-13 VITALS — BP 140/90 | HR 62 | Temp 98.0°F | Resp 16

## 2018-01-13 DIAGNOSIS — I10 Essential (primary) hypertension: Secondary | ICD-10-CM

## 2018-01-13 DIAGNOSIS — Z0000 Encounter for general adult medical examination without abnormal findings: Secondary | ICD-10-CM | POA: Insufficient documentation

## 2018-01-13 DIAGNOSIS — F339 Major depressive disorder, recurrent, unspecified: Secondary | ICD-10-CM

## 2018-01-13 DIAGNOSIS — Z1231 Encounter for screening mammogram for malignant neoplasm of breast: Secondary | ICD-10-CM | POA: Insufficient documentation

## 2018-01-13 MED ORDER — LISINOPRIL 5 MG PO TABS: 5.0000 mg | ORAL_TABLET | Freq: Every day | ORAL | 3 refills | Status: DC

## 2018-01-13 MED ORDER — SERTRALINE HCL 25 MG PO TABS: 25.0000 mg | ORAL_TABLET | Freq: Every day | ORAL | 1 refills | Status: DC

## 2018-01-13 NOTE — Patient Instructions (Addendum)
Please come back for physical in October as discussed.   Labs on Monday since starting lisonopril  Trial of zoloft at bedtime. Let me know how you are doing on this medication  Monitor blood pressure,  Goal is less than 130/80; if persistently higher, please make sooner follow up appointment so we can recheck you blood pressure and manage medications  We placed a referral for mammogram this year. I asked that you call one the below locations and schedule this when it is convenient for you.   As discussed, I would like you to ask for 3D mammogram over the traditional 2D mammogram as new evidence suggest 3D is superior.   Please note that NOT all insurance companies cover 3D and you may have to pay a higher copay. You may call your insurance company to further clarify your benefits.   Options for Mammogram.    [facility]  [address]  [place], [place]  [phone]  * [facility]   [facility] [address] [place], [place] [phone] * Note if you ask for 3D mammogram at this location, you must request [place], [place] location*

## 2018-01-13 NOTE — Progress Notes (Signed)
Subjective:    Patient ID: Anna Mcgee, female    DOB: 06/13/1961, 57 y.o.   MRN: [id number]  CC: Anna Mcgee is a 57 y.o. female who presents today for follow up.   HPI: HTN- taking half of half tablet diovan hct as made her dizzy on diovan. Dizziness resolved. No syncope, ear pain, sinus congestion, irregular heart beats.  Denies exertional chest pain or pressure, numbness or tingling radiating to left arm or jaw, palpitations,  frequent headaches, changes in vision, or shortness of breath.  Has lost 50 pounds, doing 20-30 program.   H/o prediabetic  Depression- doing well on wellbutrin. 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 Feels more anxious, Worried. No Si/HI      HISTORY:  Past Medical History:  Diagnosis Date  . Heart murmur   . History of anemia   . Hypertension   . Postmenopausal bleeding   . Vitamin D deficiency   . Vitamin D deficiency    Past Surgical History:  Procedure Laterality Date  . GASTRIC BYPASS  2006   Family History  Problem Relation Age of Onset  . Hypertension Mother   . Hypertension Father   . Arthritis Maternal Grandmother   . Colon cancer Neg Hx     Allergies: Penicillins Current Outpatient Medications on File Prior to Visit  Medication Sig Dispense Refill  . buPROPion (WELLBUTRIN XL) 300 MG 24 hr tablet Take 1 tablet (300 mg total) by mouth every morning. 30 tablet 0   No current facility-administered medications on file prior to visit.     Social History   Tobacco Use  . Smoking status: Former Smoker    Types: Cigarettes    Last attempt to quit: 08/01/2010    Years since quitting: 7.4  . Smokeless tobacco: Never Used  . Tobacco comment: Vapes  Substance Use Topics  . Alcohol use: Yes    Alcohol/week: 0.0 oz    Comment: Rare  . Drug use: No    Review of Systems  Constitutional: Negative for chills and fever.  Respiratory: Negative for cough.   Cardiovascular: Negative for chest pain and palpitations.  Gastrointestinal:  Negative for nausea and vomiting.  Neurological: Negative for dizziness (resolved).  Psychiatric/Behavioral: Negative for sleep disturbance and suicidal ideas. The patient is nervous/anxious.       Objective:    BP 140/90 (BP Location: Left Arm, Patient Position: Sitting, Cuff Size: Large)   Pulse 62   Temp 98 F (36.7 C)   Resp 16   SpO2 99%  BP Readings from Last 3 Encounters:  01/13/18 140/90  07/23/17 120/78  03/04/17 124/84   Wt Readings from Last 3 Encounters:  07/23/17 204 lb (92.5 kg)    Physical Exam  Constitutional: She appears well-developed and well-nourished.  Eyes: Conjunctivae are normal.  Cardiovascular: Normal rate, regular rhythm, normal heart sounds and normal pulses.  Pulmonary/Chest: Effort normal and breath sounds normal. She has no wheezes. She has no rhonchi. She has no rales.  Neurological: She is alert.  Skin: Skin is warm and dry.  Psychiatric: She has a normal mood and affect. Her speech is normal and behavior is normal. Thought content normal.  Vitals reviewed.      Assessment & Plan:   Problem List Items Addressed This Visit      Cardiovascular and Mediastinum   HTN (hypertension) - Primary    Slightly elevated today.  Discontinue valsartan hydrochlorothiazide due to dizziness.  Dizziness at this time has resolved as patient was taking  less and less medication.  Will trial patient on lisinopril.  Repeat electrolyte in 5 days.  Patient is aware of this.  She will keep blood pressure monitor at home.      Relevant Medications   lisinopril (PRINIVIL,ZESTRIL) 5 MG tablet   Other Relevant Orders   Basic metabolic panel   Hemoglobin A1c   VITAMIN D 25 Hydroxy (Vit-D Deficiency, Fractures)     Other   Depression, recurrent (HCC)    Anxiety has increased.  Doing well Wellbutrin.  We jointly agreed we will not change this medication at this time.  We will however augment with a small dose of Zoloft.  Patient will let me know how she is doing.       Relevant Medications   sertraline (ZOLOFT) 25 MG tablet   Screening mammogram, encounter for    Very overdue.  Patient understands to schedule.  She also understands that she needs to get medical release form from [place] for images done at GI .  Patient will do this on her own.      Relevant Orders   MM 3D SCREEN BREAST BILATERAL       I have discontinued Jakaylee A. Mosely's valsartan-hydrochlorothiazide. I am also having her start on lisinopril and sertraline. Additionally, I am having her maintain her buPROPion.   Meds ordered this encounter  Medications  . lisinopril (PRINIVIL,ZESTRIL) 5 MG tablet    Sig: Take 1 tablet (5 mg total) by mouth daily.    Dispense:  30 tablet    Refill:  3    Order Specific Question:   Supervising Provider    Answer:   Duncan Dull L [[id number]]  . sertraline (ZOLOFT) 25 MG tablet    Sig: Take 1 tablet (25 mg total) by mouth daily.    Dispense:  30 tablet    Refill:  1    Order Specific Question:   Supervising Provider    Answer:   Sherlene Shams [[id number]]    Return precautions given.   Risks, benefits, and alternatives of the medications and treatment plan prescribed today were discussed, and patient expressed understanding.   Education regarding symptom management and diagnosis given to patient on AVS.  Continue to follow with Allegra Grana, FNP for routine health maintenance.   Anna Rainier and I agreed with plan.   Rennie Plowman, FNP

## 2018-01-13 NOTE — Assessment & Plan Note (Signed)
Very overdue.  Patient understands to schedule.  She also understands that she needs to [place] medical [place] at [place] .  Patient will do this on her own.

## 2018-01-13 NOTE — Assessment & Plan Note (Signed)
Anxiety has increased.  Doing well Wellbutrin.  We jointly agreed we will not change this medication at this time.  We will however augment with a small dose of Zoloft.  Patient will let me know how she is doing.

## 2018-01-13 NOTE — Assessment & Plan Note (Signed)
Slightly elevated today.  Discontinue valsartan hydrochlorothiazide due to dizziness.  Dizziness at this time has resolved as patient was taking less and less medication.  Will trial patient on lisinopril.  Repeat electrolyte in 5 days.  Patient is aware of this.  She will keep blood pressure monitor at home.

## 2018-02-05 LAB — BASIC METABOLIC PANEL
BUN/Creatinine Ratio: 18 (ref 9–23)
BUN: 15 mg/dL (ref 6–24)
CO2: 25 mmol/L (ref 20–29)
Calcium: 9.3 mg/dL (ref 8.7–10.2)
Chloride: 106 mmol/L (ref 96–106)
Creatinine, Ser: 0.85 mg/dL (ref 0.57–1.00)
GFR, EST AFRICAN AMERICAN: 87 mL/min/{1.73_m2} (ref >59)
GFR, EST NON AFRICAN AMERICAN: 76 mL/min/{1.73_m2} (ref >59)
Glucose: 104 mg/dL — ABNORMAL HIGH (ref 65–99)
POTASSIUM: 4.5 mmol/L (ref 3.5–5.2)
Sodium: 143 mmol/L (ref 134–144)

## 2018-02-05 LAB — VITAMIN D 25 HYDROXY (VIT D DEFICIENCY, FRACTURES): VIT D 25 HYDROXY: 21.3 ng/mL — AB (ref 30.0–100.0)

## 2018-02-05 LAB — HEMOGLOBIN A1C
Est. average glucose Bld gHb Est-mCnc: 111 mg/dL
Hgb A1c MFr Bld: 5.5 % (ref 4.8–5.6)

## 2018-02-13 ENCOUNTER — Other Ambulatory Visit: Payer: Self-pay | Admitting: Family

## 2018-02-13 DIAGNOSIS — F339 Major depressive disorder, recurrent, unspecified: Secondary | ICD-10-CM

## 2018-02-16 ENCOUNTER — Encounter: Payer: Self-pay | Admitting: Family

## 2018-02-16 ENCOUNTER — Other Ambulatory Visit: Payer: Self-pay

## 2018-03-22 ENCOUNTER — Telehealth: Payer: Self-pay | Admitting: Family

## 2018-03-22 NOTE — Telephone Encounter (Signed)
-----   Message from Jonne Ply, RN sent at 03/22/2018 10:40 AM EDT ----- Yes, we attempted to call her 01/15/17, 01/20/17, and sent her a letter. If she is agreeable to screening we are happy to attempt to contact her again. Thanks, Shawn ----- Message ----- From: Allegra Grana, FNP Sent: 03/22/2018  10:09 AM EDT To: Jonne Ply, RN  Hi shawn,   I had ordered a CT lung screen for this patient and now see it's overdue  Did your office speak with patient ?  Thanks!  [employer]

## 2018-03-22 NOTE — Telephone Encounter (Signed)
Call pt  Does she still want to pursue lung cancer screen ct?  Doesn't look she ever called oncology back to schedule

## 2018-03-29 ENCOUNTER — Ambulatory Visit (INDEPENDENT_AMBULATORY_CARE_PROVIDER_SITE_OTHER): Payer: 59 | Admitting: Family

## 2018-03-29 ENCOUNTER — Other Ambulatory Visit: Payer: Self-pay | Admitting: Family

## 2018-03-29 ENCOUNTER — Encounter: Payer: Self-pay | Admitting: Family

## 2018-03-29 VITALS — BP 124/90 | HR 83 | Temp 98.4°F | Ht 66.0 in | Wt 210.0 lb

## 2018-03-29 DIAGNOSIS — Z0000 Encounter for general adult medical examination without abnormal findings: Secondary | ICD-10-CM

## 2018-03-29 DIAGNOSIS — F339 Major depressive disorder, recurrent, unspecified: Secondary | ICD-10-CM | POA: Diagnosis not present

## 2018-03-29 DIAGNOSIS — I10 Essential (primary) hypertension: Secondary | ICD-10-CM | POA: Diagnosis not present

## 2018-03-29 MED ORDER — AMLODIPINE BESYLATE 2.5 MG PO TABS: 2.5000 mg | ORAL_TABLET | Freq: Every day | ORAL | 3 refills | Status: DC

## 2018-03-29 NOTE — Patient Instructions (Addendum)
Start amlodipine; stop lisinopril  Monitor blood pressure,  Goal is less than 120/80, based on newest guidelines; if persistently higher, please make sooner follow up appointment so we can recheck you blood pressure and manage medications  [id number] placed a referral for mammogram this year. I asked that you call one the below locations and schedule this when it is convenient for you.   You will need to complete medical records request and have OLD mammograms sent to NEW location.   As discussed, I would like you to ask for 3D mammogram over the traditional 2D mammogram as new evidence suggest 3D is superior.   Please note that NOT all insurance companies cover 3D and you may have to pay a higher copay. You may call your insurance company to further clarify your benefits.   Options for [place]  [place], [place]  * [facility] [place], [place] * Note if you ask for 3D mammogram at this location, you must request [place], [place] location*    Health Maintenance for Postmenopausal Women Menopause is a normal process in which your reproductive ability comes to an end. This process happens gradually over a span of months to years, usually between the ages of 14 and 39. Menopause is complete when you have missed 12 consecutive menstrual periods. It is important to talk with your health care provider about some of the most common conditions that affect postmenopausal women, such as heart disease, cancer, and bone loss (osteoporosis). Adopting a healthy lifestyle and getting preventive care can help to promote your health and wellness. Those actions can also lower your chances of developing some of these common conditions. What should I know about menopause? During menopause, you may experience a number of symptoms, such as:  Moderate-to-severe  hot flashes.  Night sweats.  Decrease in sex drive.  Mood swings.  Headaches.  Tiredness.  Irritability.  Memory problems.  Insomnia.  Choosing to treat or not to treat menopausal changes is an individual decision that you make with your health care provider. What should I know about hormone replacement therapy and supplements? Hormone therapy products are effective for treating symptoms that are associated with menopause, such as hot flashes and night sweats. Hormone replacement carries certain risks, especially as you become older. If you are thinking about using estrogen or estrogen with progestin treatments, discuss the benefits and risks with your health care provider. What should I know about heart disease and stroke? Heart disease, heart attack, and stroke become more likely as you age. This may be due, in part, to the hormonal changes that your body experiences during menopause. These can affect how your body processes dietary fats, triglycerides, and cholesterol. Heart attack and stroke are both medical emergencies. There are many things that you can do to help prevent heart disease and stroke:  Have your blood pressure checked at least every 1-2 years. High blood pressure causes heart disease and increases the risk of stroke.  If you are 31-23 years old, ask your health care provider if you should take aspirin to prevent a heart attack or a stroke.  Do not use any tobacco products, including cigarettes, chewing tobacco, or electronic cigarettes. If you need help quitting, ask your health care provider.  It is important to eat a healthy diet and maintain a healthy weight. ? Be sure to include  plenty of vegetables, fruits, low-fat dairy products, and lean protein. ? Avoid eating foods that are high in solid fats, added sugars, or salt (sodium).  Get regular exercise. This is one of the most important things that you can do for your health. ? Try to exercise for at least  150 minutes each week. The type of exercise that you do should increase your heart rate and make you sweat. This is known as moderate-intensity exercise. ? Try to do strengthening exercises at least twice each week. Do these in addition to the moderate-intensity exercise.  Know your numbers.Ask your health care provider to check your cholesterol and your blood glucose. Continue to have your blood tested as directed by your health care provider.  What should I know about cancer screening? There are several types of cancer. Take the following steps to reduce your risk and to catch any cancer development as early as possible. Breast Cancer  Practice breast self-awareness. ? This means understanding how your breasts normally appear and feel. ? It also means doing regular breast self-exams. Let your health care provider know about any changes, no matter how small.  If you are 59 or older, have a clinician do a breast exam (clinical breast exam or CBE) every year. Depending on your age, family history, and medical history, it may be recommended that you also have a yearly breast X-ray (mammogram).  If you have a family history of breast cancer, talk with your health care provider about genetic screening.  If you are at high risk for breast cancer, talk with your health care provider about having an MRI and a mammogram every year.  Breast cancer (BRCA) gene test is recommended for women who have family members with BRCA-related cancers. Results of the assessment will determine the need for genetic counseling and BRCA1 and for BRCA2 testing. BRCA-related cancers include these types: ? Breast. This occurs in males or females. ? Ovarian. ? Tubal. This may also be called fallopian tube cancer. ? Cancer of the abdominal or pelvic lining (peritoneal cancer). ? Prostate. ? Pancreatic.  Cervical, Uterine, and Ovarian Cancer Your health care provider may recommend that you be screened regularly for  cancer of the pelvic organs. These include your ovaries, uterus, and vagina. This screening involves a pelvic exam, which includes checking for microscopic changes to the surface of your cervix (Pap test).  For women ages 21-65, health care providers may recommend a pelvic exam and a Pap test every three years. For women ages 19-65, they may recommend the Pap test and pelvic exam, combined with testing for human papilloma virus (HPV), every five years. Some types of HPV increase your risk of cervical cancer. Testing for HPV may also be done on women of any age who have unclear Pap test results.  Other health care providers may not recommend any screening for nonpregnant women who are considered low risk for pelvic cancer and have no symptoms. Ask your health care provider if a screening pelvic exam is right for you.  If you have had past treatment for cervical cancer or a condition that could lead to cancer, you need Pap tests and screening for cancer for at least 20 years after your treatment. If Pap tests have been discontinued for you, your risk factors (such as having a new sexual partner) need to be reassessed to determine if you should start having screenings again. Some women have medical problems that increase the chance of getting cervical cancer. In these cases, your  health care provider may recommend that you have screening and Pap tests more often.  If you have a family history of uterine cancer or ovarian cancer, talk with your health care provider about genetic screening.  If you have vaginal bleeding after reaching menopause, tell your health care provider.  There are currently no reliable tests available to screen for ovarian cancer.  Lung Cancer Lung cancer screening is recommended for adults 46-108 years old who are at high risk for lung cancer because of a history of smoking. A yearly low-dose CT scan of the lungs is recommended if you:  Currently smoke.  Have a history of at  least 30 pack-years of smoking and you currently smoke or have quit within the past 15 years. A pack-year is smoking an average of one pack of cigarettes per day for one year.  Yearly screening should:  Continue until it has been 15 years since you quit.  Stop if you develop a health problem that would prevent you from having lung cancer treatment.  Colorectal Cancer  This type of cancer can be detected and can often be prevented.  Routine colorectal cancer screening usually begins at age 82 and continues through age 60.  If you have risk factors for colon cancer, your health care provider may recommend that you be screened at an earlier age.  If you have a family history of colorectal cancer, talk with your health care provider about genetic screening.  Your health care provider may also recommend using home test kits to check for hidden blood in your stool.  A small camera at the end of a tube can be used to examine your colon directly (sigmoidoscopy or colonoscopy). This is done to check for the earliest forms of colorectal cancer.  Direct examination of the colon should be repeated every 5-10 years until age 57. However, if early forms of precancerous polyps or small growths are found or if you have a family history or genetic risk for colorectal cancer, you may need to be screened more often.  Skin Cancer  Check your skin from head to toe regularly.  Monitor any moles. Be sure to tell your health care provider: ? About any new moles or changes in moles, especially if there is a change in a mole's shape or color. ? If you have a mole that is larger than the size of a pencil eraser.  If any of your family members has a history of skin cancer, especially at a young age, talk with your health care provider about genetic screening.  Always use sunscreen. Apply sunscreen liberally and repeatedly throughout the day.  Whenever you are outside, protect yourself by wearing long  sleeves, pants, a wide-brimmed hat, and sunglasses.  What should I know about osteoporosis? Osteoporosis is a condition in which bone destruction happens more quickly than new bone creation. After menopause, you may be at an increased risk for osteoporosis. To help prevent osteoporosis or the bone fractures that can happen because of osteoporosis, the following is recommended:  If you are 82-7 years old, get at least 1,000 mg of calcium and at least 600 mg of vitamin D per day.  If you are older than age 60 but younger than age 34, get at least 1,200 mg of calcium and at least 600 mg of vitamin D per day.  If you are older than age 41, get at least 1,200 mg of calcium and at least 800 mg of vitamin D per day.  Smoking and excessive alcohol intake increase the risk of osteoporosis. Eat foods that are rich in calcium and vitamin D, and do weight-bearing exercises several times each week as directed by your health care provider. What should I know about how menopause affects my mental health? Depression may occur at any age, but it is more common as you become older. Common symptoms of depression include:  Low or sad mood.  Changes in sleep patterns.  Changes in appetite or eating patterns.  Feeling an overall lack of motivation or enjoyment of activities that you previously enjoyed.  Frequent crying spells.  Talk with your health care provider if you think that you are experiencing depression. What should I know about immunizations? It is important that you get and maintain your immunizations. These include:  Tetanus, diphtheria, and pertussis (Tdap) booster vaccine.  Influenza every year before the flu season begins.  Pneumonia vaccine.  Shingles vaccine.  Your health care provider may also recommend other immunizations. This information is not intended to replace advice given to you by your health care provider. Make sure you discuss any questions you have with your health care  provider. Document Released: 07/25/2005 Document Revised: 12/21/2015 Document Reviewed: 03/06/2015 Elsevier Interactive Patient Education  2018 [employer].

## 2018-03-29 NOTE — Assessment & Plan Note (Signed)
CBE and pap performed. Patient will schedule mammogram at [facility] per her preference and understands to request records from GI . She will speak with [place] regarding this process.

## 2018-03-29 NOTE — Progress Notes (Signed)
Subjective:    Patient ID: Anna Mcgee, female    DOB: 1961/02/17, 57 y.o.   MRN: [id number]  CC: Anna Mcgee is a 57 y.o. female who presents today for physical exam.    HPI: Feels well today. No complaints.   HTN- hasnt taken lisinopril for 5 days, as felt dizzy while on medication. Describes as episodes of dizziness, even when still. Feels lightheaded. No syncope, cp, palpitations.  Resolves on its own after a couple of minutes. Feels shakey when it happens.   Symptoms resolved when stopped lisinopril.   Depression- on wellbutrin 'helps me a lot.'  Never started the zoloft. 'sleeping like a log ' . Has moved to new place without bats and now sleeping better.   On vitamin D.      Colorectal Cancer Screening: cologuard negative 2018.  Breast Cancer Screening: Mammogram due Cervical Cancer Screening: due. No vaginal bleeding Bone Health screening/DEXA for 65+: No increased fracture risk. Defer screening at this time. Lung Cancer Screening: Doesn't have 30 year pack year history and age > 55 years.       Tetanus - utd         Labs: Screening labs today. Exercise: Gets regular exercise.  Alcohol use: rare Smoking/tobacco use: former smoker.    HISTORY:  Past Medical History:  Diagnosis Date  . Heart murmur   . History of anemia   . Hypertension   . Postmenopausal bleeding   . Vitamin D deficiency   . Vitamin D deficiency     Past Surgical History:  Procedure Laterality Date  . GASTRIC BYPASS  2006   Family History  Problem Relation Age of Onset  . Hypertension Mother   . Hypertension Father   . Arthritis Maternal Grandmother   . Colon cancer Neg Hx       ALLERGIES: Penicillins  Current Outpatient Medications on File Prior to Visit  Medication Sig Dispense Refill  . buPROPion (WELLBUTRIN XL) 300 MG 24 hr tablet TAKE 1 TABLET BY MOUTH EVERY DAY IN THE MORNING 30 tablet 0   No current facility-administered medications on file prior to visit.      Social History   Tobacco Use  . Smoking status: Former Smoker    Types: Cigarettes    Last attempt to quit: 08/01/2010    Years since quitting: 7.6  . Smokeless tobacco: Never Used  . Tobacco comment: Vapes  Substance Use Topics  . Alcohol use: Yes    Alcohol/week: 0.0 standard drinks    Comment: Rare  . Drug use: No    Review of Systems  Constitutional: Negative for chills, fever and unexpected weight change.  HENT: Negative for congestion.   Respiratory: Negative for cough.   Cardiovascular: Negative for chest pain, palpitations and leg swelling.  Gastrointestinal: Negative for nausea and vomiting.  Genitourinary: Negative for vaginal bleeding.  Musculoskeletal: Negative for arthralgias and myalgias.  Skin: Negative for rash.  Neurological: Positive for dizziness (resolved). Negative for headaches.  Hematological: Negative for adenopathy.  Psychiatric/Behavioral: Negative for confusion.      Objective:    BP 124/90 (BP Location: Left Arm, Patient Position: Standing, Cuff Size: Large)   Pulse 83   Temp 98.4 F (36.9 C) (Oral)   Ht 5\' 6"  (1.676 m)   Wt 210 lb (95.3 kg) Comment: patient reported  SpO2 99%   BMI 33.89 kg/m   BP Readings from Last 3 Encounters:  03/29/18 124/90  01/13/18 140/90  07/23/17 120/78  Wt Readings from Last 3 Encounters:  03/29/18 210 lb (95.3 kg)  07/23/17 204 lb (92.5 kg)    Physical Exam  Constitutional: She appears well-developed and well-nourished.  Eyes: Conjunctivae are normal.  Neck: No thyroid mass and no thyromegaly present.  Cardiovascular: Normal rate, regular rhythm, normal heart sounds and normal pulses.  Pulmonary/Chest: Effort normal and breath sounds normal. She has no wheezes. She has no rhonchi. She has no rales. Right breast exhibits no inverted nipple, no mass, no nipple discharge, no skin change and no tenderness. Left breast exhibits no inverted nipple, no mass, no nipple discharge, no skin change and no  tenderness. Breasts are symmetrical.  No masses or asymmetry appreciated during CBE.  Genitourinary: Uterus is not enlarged, not fixed and not tender. Cervix exhibits no motion tenderness, no discharge and no friability. Right adnexum displays no mass, no tenderness and no fullness. Left adnexum displays no mass, no tenderness and no fullness.  Genitourinary Comments: Pap performed. No CMT. Unable to appreciated ovaries.  Lymphadenopathy:       Head (right side): No submental, no submandibular, no tonsillar, no preauricular, no posterior auricular and no occipital adenopathy present.       Head (left side): No submental, no submandibular, no tonsillar, no preauricular, no posterior auricular and no occipital adenopathy present.       Right cervical: No superficial cervical, no deep cervical and no posterior cervical adenopathy present.      Left cervical: No superficial cervical, no deep cervical and no posterior cervical adenopathy present.    She has no axillary adenopathy.       Right axillary: No pectoral and no lateral adenopathy present.       Left axillary: No pectoral and no lateral adenopathy present. Neurological: She is alert.  Skin: Skin is warm and dry.  Psychiatric: She has a normal mood and affect. Her speech is normal and behavior is normal. Thought content normal.  Vitals reviewed.      Assessment & Plan:   Problem List Items Addressed This Visit      Cardiovascular and Mediastinum   HTN (hypertension)    Mildly elevated. Not orthostatic ( see flow sheet).  She is not on lisinopril today. We will start amlodipine with close follow up.       Relevant Medications   amLODipine (NORVASC) 2.5 MG tablet     Other   Routine physical examination - Primary    CBE and pap performed. Patient will schedule mammogram at [facility] per her preference and understands to request records from GI . She will speak with [place] regarding this process.       Relevant Orders   CBC with  Differential/Platelet   Comprehensive metabolic panel   Cytology - PAP   TSH   VITAMIN D 25 Hydroxy (Vit-D Deficiency, Fractures)   Lipid panel   MM 3D SCREEN BREAST BILATERAL   Depression, recurrent (HCC)    Improved. No changes to regimen.           I have discontinued Eriyanna A. Ingles's lisinopril and sertraline. I am also having her start on amLODipine. Additionally, I am having her maintain her buPROPion.   Meds ordered this encounter  Medications  . amLODipine (NORVASC) 2.5 MG tablet    Sig: Take 1 tablet (2.5 mg total) by mouth daily.    Dispense:  90 tablet    Refill:  3    Order Specific Question:   Supervising Provider    Answer:  TULLO, TERESA L [[id number]]    Return precautions given.   Risks, benefits, and alternatives of the medications and treatment plan prescribed today were discussed, and patient expressed understanding.   Education regarding symptom management and diagnosis given to patient on AVS.   Continue to follow with Allegra Grana, FNP for routine health maintenance.   Anna Mcgee and I agreed with plan.   Rennie Plowman, FNP

## 2018-03-29 NOTE — Assessment & Plan Note (Addendum)
Mildly elevated. Not orthostatic ( see flow sheet).  She is not on lisinopril today. We will start amlodipine with close follow up.

## 2018-03-29 NOTE — Assessment & Plan Note (Signed)
Improved. No changes to regimen.  

## 2018-03-30 NOTE — Telephone Encounter (Signed)
Left voice mail for patient to call back ok for [facility] to speak to patient    

## 2018-03-30 NOTE — Telephone Encounter (Signed)
Spoke with patient in regards to CT Lung screening she is not interested in scheduling at this time . She states she will call back if she decides to go forth with scheduling

## 2018-04-01 ENCOUNTER — Telehealth: Payer: Self-pay | Admitting: Family

## 2018-04-01 NOTE — Telephone Encounter (Signed)
Copied from CRM [id number]. Topic: Quick Communication - See Telephone Encounter >> Apr 01, 2018  9:54 AM Lorrine Kin, NT wrote: CRM for notification. See Telephone encounter for: 04/01/18. Kim with [facility] and states that they need to verify testing on the pap smear. Please advise. CB#: [phone]

## 2018-04-01 NOTE — Telephone Encounter (Signed)
Called back [facility] and advised testing for pap smear HPV two listed advised HPV with pap any interp.

## 2018-04-06 LAB — PAP LB AND HPV HIGH-RISK
HPV, HIGH-RISK: NEGATIVE
PAP Smear Comment: 0

## 2018-04-06 LAB — SPECIMEN STATUS REPORT

## 2018-04-07 ENCOUNTER — Telehealth: Payer: Self-pay | Admitting: Family

## 2018-04-07 ENCOUNTER — Other Ambulatory Visit: Payer: Self-pay | Admitting: Family

## 2018-04-07 DIAGNOSIS — F339 Major depressive disorder, recurrent, unspecified: Secondary | ICD-10-CM

## 2018-04-07 DIAGNOSIS — I10 Essential (primary) hypertension: Secondary | ICD-10-CM

## 2018-04-07 MED ORDER — AMLODIPINE BESYLATE 2.5 MG PO TABS: 2.5000 mg | ORAL_TABLET | Freq: Every day | ORAL | 3 refills | Status: DC

## 2018-04-07 MED ORDER — BUPROPION HCL ER (XL) 300 MG PO TB24: ORAL_TABLET | ORAL | 0 refills | Status: DC

## 2018-04-07 NOTE — Telephone Encounter (Signed)
Copied from CRM [id number]. Topic: General - Other >> Apr 07, 2018  8:16 AM Anna Mcgee wrote: Reason for CRM: Patient called to speak with the doctor or nurse regarding her BP medication.  Patient stated that the doctor changed her medication but she does not know what it was changed to and would like a refill on the medication.  Please advise.  CB [phone].

## 2018-04-07 NOTE — Telephone Encounter (Signed)
Copied from CRM [id number]. Topic: Quick Communication - Rx Refill/Question >> Apr 07, 2018  8:14 AM Tamela Oddi wrote: Medication: buPROPion (WELLBUTRIN XL) 300 MG 24 hr tablet  Patient called to request a refill for the above medication.  CB# [phone]   Preferred Pharmacy (with phone number or street name): [id number] [place], [place] - [address] AT [facility] [address] [phone] (Phone) [phone] (Fax)

## 2018-04-07 NOTE — Telephone Encounter (Signed)
Spoke with patient she  script for amlodipine isn't at [facility] . Looks like [facility] will I will send script to [facility].  Script sent

## 2018-04-07 NOTE — Telephone Encounter (Signed)
Requested Prescriptions  Pending Prescriptions Disp Refills  . buPROPion (WELLBUTRIN XL) 300 MG 24 hr tablet 90 tablet 0    Sig: TAKE 1 TABLET BY MOUTH EVERY DAY IN THE MORNING     Psychiatry: Antidepressants - bupropion Failed - 04/07/2018  9:03 AM      Failed - Completed PHQ-2 or PHQ-9 in the last 360 days.      Failed - Last BP in normal range    BP Readings from Last 1 Encounters:  03/29/18 124/90         Passed - Valid encounter within last 6 months    Recent Outpatient Visits          1 week ago Routine physical examination   [facility] [place] Arnett, Lyn Records, FNP   2 months ago Essential hypertension   [facility] [place], Lyn Records, FNP   8 months ago Acute upper respiratory infection   [facility] [place], Eldon, FNP   1 year ago Essential hypertension   [facility] [place] Arnett, Lyn Records, FNP   1 year ago Routine physical examination   [facility] [place] Arnett, Lyn Records, FNP      Future Appointments            In 2 months Arnett, Lyn Records, FNP [facility] [place], [facility]         Pt is due for a PHQ-9

## 2018-04-28 ENCOUNTER — Encounter: Payer: Self-pay | Admitting: Family Medicine

## 2018-04-28 ENCOUNTER — Ambulatory Visit: Payer: 59 | Admitting: Family Medicine

## 2018-04-28 VITALS — BP 148/82 | HR 76 | Temp 98.3°F | Resp 16 | Ht 66.0 in

## 2018-04-28 DIAGNOSIS — H9203 Otalgia, bilateral: Secondary | ICD-10-CM | POA: Diagnosis not present

## 2018-04-28 DIAGNOSIS — H66001 Acute suppurative otitis media without spontaneous rupture of ear drum, right ear: Secondary | ICD-10-CM | POA: Diagnosis not present

## 2018-04-28 MED ORDER — DOXYCYCLINE HYCLATE 100 MG PO TABS: 100.0000 mg | ORAL_TABLET | Freq: Two times a day (BID) | ORAL | 0 refills | Status: DC

## 2018-04-28 NOTE — Progress Notes (Signed)
Subjective:    Patient ID: Anna Mcgee, female    DOB: Dec 16, 1960, 57 y.o.   MRN: [id number]  HPI   Presents to clinic c/o ear pain, right more so than left for the past 2 weeks.  States she went to urgent care about a week and a half ago, and was advised to take Claritin and Flonase.  This seemed to clear up the congestion and ears for about a day, but then symptoms returned.  States hearing in right ear especially is muffled.  Denies fever or chills.  Denies shortness of breath, cough or wheezing.  Denies nausea, vomiting or diarrhea.   Patient Active Problem List   Diagnosis Date Noted  . Screening mammogram, encounter for 01/13/2018  . Depression, recurrent (HCC) 01/12/2017  . Rash and nonspecific skin eruption 09/17/2015  . Insomnia 08/06/2015  . Paresthesia 08/06/2015  . History of gastric bypass 08/06/2015  . HTN (hypertension) 08/02/2015  . Adiposity 08/02/2015  . Avitaminosis D 08/02/2015  . Routine physical examination 08/02/2015  . Back ache 04/16/2012   Social History   Tobacco Use  . Smoking status: Former Smoker    Types: Cigarettes    Last attempt to quit: 08/01/2010    Years since quitting: 7.7  . Smokeless tobacco: Never Used  . Tobacco comment: Vapes  Substance Use Topics  . Alcohol use: Yes    Alcohol/week: 0.0 standard drinks    Comment: Rare   Review of Systems  Constitutional: Negative for chills, fatigue and fever.  HENT: +ear pain, right more than left Eyes: Negative.   Respiratory: Negative for cough, shortness of breath and wheezing.   Cardiovascular: Negative for chest pain, palpitations and leg swelling.  Gastrointestinal: Negative for abdominal pain, diarrhea, nausea and vomiting.  Genitourinary: Negative for dysuria, frequency and urgency.  Musculoskeletal: Negative for arthralgias and myalgias.  Skin: Negative for color change, pallor and rash.  Neurological: Negative for syncope, light-headedness and headaches.    Psychiatric/Behavioral: The patient is not nervous/anxious.       Objective:   Physical Exam  Constitutional: She is oriented to person, place, and time. She appears well-nourished. No distress.  HENT:  Head: Normocephalic and atraumatic.  Right Ear: Tympanic membrane is injected, erythematous and bulging. A middle ear effusion is present.  Left Ear: A middle ear effusion is present.  Eyes: Conjunctivae and EOM are normal. No scleral icterus.  Neck: Neck supple. No tracheal deviation present.  Cardiovascular: Normal rate and regular rhythm.  Pulmonary/Chest: Effort normal and breath sounds normal. No respiratory distress. She has no wheezes. She has no rales.  Musculoskeletal: She exhibits no edema.  Neurological: She is alert and oriented to person, place, and time.  Skin: Skin is warm and dry. No pallor.  Psychiatric: She has a normal mood and affect. Her behavior is normal.  Nursing note and vitals reviewed.  Vitals:   04/28/18 0910  BP: (!) 148/82  Pulse: 76  Resp: 16  Temp: 98.3 F (36.8 C)  SpO2: 97%      Assessment & Plan:   Acute otitis media of right ear/otalgia of both ears- patient will take doxycycline twice daily for 10 days.  She is penicillin allergic, so this medication was chosen for that reason.  Advised she can use Tylenol or Motrin as needed to help pain.  Also advised to continue Flonase and Claritin, these medications can help open up sinuses and get things draining to help improve both ear pain and effusion of the  ears.  Keep regularly scheduled follow-up with PCP.  Return to clinic sooner if issues arise.

## 2018-06-03 ENCOUNTER — Telehealth: Payer: Self-pay | Admitting: Family

## 2018-06-03 NOTE — Telephone Encounter (Unsigned)
Copied from CRM [phone]. Topic: General - Other >> Jun 03, 2018  7:58 AM [name], [name] wrote: Reason for CRM:   Patient was in the office in November for eye pain and she was given an antibiotic.  It cleared up the ear pain, but now the pain is back again.  Patient would like to know if she can have a refill on the antibiotic that she had until she can come into the office next week.

## 2018-06-04 ENCOUNTER — Encounter: Payer: Self-pay | Admitting: Family Medicine

## 2018-06-04 ENCOUNTER — Ambulatory Visit: Payer: 59 | Admitting: Family Medicine

## 2018-06-04 VITALS — BP 128/80 | HR 78 | Temp 98.1°F | Resp 16 | Ht 66.0 in

## 2018-06-04 DIAGNOSIS — H66001 Acute suppurative otitis media without spontaneous rupture of ear drum, right ear: Secondary | ICD-10-CM

## 2018-06-04 DIAGNOSIS — H9203 Otalgia, bilateral: Secondary | ICD-10-CM | POA: Diagnosis not present

## 2018-06-04 MED ORDER — AZITHROMYCIN 250 MG PO TABS: ORAL_TABLET | ORAL | 0 refills | Status: DC

## 2018-06-04 MED ORDER — LORATADINE 10 MG PO TABS: 10.0000 mg | ORAL_TABLET | Freq: Every day | ORAL | 2 refills | Status: DC

## 2018-06-04 MED ORDER — METHYLPREDNISOLONE 4 MG PO TBPK: ORAL_TABLET | ORAL | 0 refills | Status: DC

## 2018-06-04 NOTE — Progress Notes (Signed)
Subjective:    Patient ID: Anna Mcgee, female    DOB: 01/09/1961, 57 y.o.   MRN: [id number]  HPI  Presents to clinic for follow up on right ear pain. She was treated with doxycycline course starting 04/28/18 for ear infection.  Patient states after the doxycycline course, her ear did begin to feel better for about a week.  The ear pain began to slowly return over the past 2 weeks, and now will have pain even with tugging on her right ear. Does also have some pain in left ear, but right ear is worse.   Denies any hearing loss.  Denies fever or chills.  Denies sinus pain. Denies cough, shortness of breath or wheezing.  Patient Active Problem List   Diagnosis Date Noted  . Screening mammogram, encounter for 01/13/2018  . Depression, recurrent (HCC) 01/12/2017  . Rash and nonspecific skin eruption 09/17/2015  . Insomnia 08/06/2015  . Paresthesia 08/06/2015  . History of gastric bypass 08/06/2015  . HTN (hypertension) 08/02/2015  . Adiposity 08/02/2015  . Avitaminosis D 08/02/2015  . Routine physical examination 08/02/2015  . Back ache 04/16/2012   Social History   Tobacco Use  . Smoking status: Former Smoker    Types: Cigarettes    Last attempt to quit: 08/01/2010    Years since quitting: 7.8  . Smokeless tobacco: Never Used  . Tobacco comment: Vapes  Substance Use Topics  . Alcohol use: Yes    Alcohol/week: 0.0 standard drinks    Comment: Rare   Review of Systems  Constitutional: Negative for chills, fatigue and fever.  HENT: +right ear pain  Eyes: Negative.   Respiratory: Negative for cough, shortness of breath and wheezing.   Cardiovascular: Negative for chest pain, palpitations and leg swelling.  Gastrointestinal: Negative for abdominal pain, diarrhea, nausea and vomiting.  Genitourinary: Negative for dysuria, frequency and urgency.  Musculoskeletal: Negative for arthralgias and myalgias.  Skin: Negative for color change, pallor and rash.  Neurological: Negative  for syncope, light-headedness and headaches.  Psychiatric/Behavioral: The patient is not nervous/anxious.       Objective:   Physical Exam Vitals signs reviewed.  Constitutional:      Appearance: Normal appearance.  HENT:     Head: Normocephalic and atraumatic.     Right Ear: Tenderness present. A middle ear effusion is present. Tympanic membrane is injected, erythematous and bulging.     Left Ear: A middle ear effusion is present.     Mouth/Throat:     Mouth: Mucous membranes are moist.  Eyes:     General:        Right eye: No discharge.        Left eye: No discharge.     Extraocular Movements: Extraocular movements intact.     Conjunctiva/sclera: Conjunctivae normal.  Neck:     Musculoskeletal: Normal range of motion and neck supple. No neck rigidity.  Cardiovascular:     Rate and Rhythm: Normal rate and regular rhythm.     Heart sounds: Normal heart sounds.  Pulmonary:     Effort: Pulmonary effort is normal. No respiratory distress.     Breath sounds: Normal breath sounds.  Skin:    General: Skin is warm and dry.     Coloration: Skin is not pale.  Neurological:     Mental Status: She is alert and oriented to person, place, and time.  Psychiatric:        Mood and Affect: Mood normal.  Behavior: Behavior normal.       Vitals:   06/04/18 1123  BP: 128/80  Pulse: 78  Resp: 16  Temp: 98.1 F (36.7 C)  SpO2: 98%   Assessment & Plan:    Right ear infection, bilateral ear pain - patient will take azithromycin course.  She will begin to take Claritin 10 mg once per day every day for at least the next month or so.  Patient will also take steroid taper to improve pain and inflammation.  Patient advised at that if the ear pain returns, we will have to consider ENT evaluation.  Patient will keep regularly scheduled follow-up as planned with PCP.  Advised return to clinic sooner if any issues arise.

## 2018-06-08 ENCOUNTER — Ambulatory Visit: Payer: 59 | Admitting: Family Medicine

## 2018-06-30 ENCOUNTER — Ambulatory Visit: Payer: 59 | Admitting: Family

## 2018-07-01 ENCOUNTER — Other Ambulatory Visit: Payer: Self-pay | Admitting: Family

## 2018-07-01 DIAGNOSIS — F339 Major depressive disorder, recurrent, unspecified: Secondary | ICD-10-CM

## 2018-07-23 ENCOUNTER — Telehealth: Payer: Self-pay | Admitting: Family

## 2018-07-23 NOTE — Telephone Encounter (Signed)
Call pt   Your mammogram appears due.   Please let me know once you have scheduled.     

## 2018-07-23 NOTE — Telephone Encounter (Signed)
Forwarding to Sarah.

## 2018-07-26 NOTE — Telephone Encounter (Signed)
I spoke with patient & she stated that she had number to [facility]. She would schedule mammogram.

## 2018-10-04 ENCOUNTER — Other Ambulatory Visit: Payer: Self-pay | Admitting: Family

## 2018-10-04 DIAGNOSIS — I10 Essential (primary) hypertension: Secondary | ICD-10-CM

## 2018-10-04 DIAGNOSIS — F339 Major depressive disorder, recurrent, unspecified: Secondary | ICD-10-CM

## 2018-10-04 MED ORDER — BUPROPION HCL ER (XL) 300 MG PO TB24: ORAL_TABLET | ORAL | 0 refills | Status: DC

## 2018-10-04 MED ORDER — AMLODIPINE BESYLATE 2.5 MG PO TABS: 2.5000 mg | ORAL_TABLET | Freq: Every day | ORAL | 3 refills | Status: DC

## 2018-10-04 NOTE — Telephone Encounter (Signed)
Requested Prescriptions  Pending Prescriptions Disp Refills  . buPROPion (WELLBUTRIN XL) 300 MG 24 hr tablet 90 tablet 0    Sig: TAKE 1 TABLET BY MOUTH EVERY MORNING     Psychiatry: Antidepressants - bupropion Failed - 10/04/2018  9:43 AM      Failed - Completed PHQ-2 or PHQ-9 in the last 360 days.      Passed - Last BP in normal range    BP Readings from Last 1 Encounters:  06/04/18 128/80         Passed - Valid encounter within last 6 months    Recent Outpatient Visits          4 months ago Acute suppurative otitis media of right ear    [facility] [place] Guse, Janna Arch, FNP   5 months ago Acute suppurative otitis media of right ear    [facility] [place] Guse, Janna Arch, FNP   6 months ago Routine physical examination   [facility] [place] Arnett, Lyn Records, FNP   8 months ago Essential hypertension   [facility] [place], Lyn Records, FNP   1 year ago Acute upper respiratory infection   [facility] [place], [place], [place]           . amLODipine (NORVASC) 2.5 MG tablet 90 tablet 3    Sig: Take 1 tablet (2.5 mg total) by mouth daily.     Cardiovascular:  Calcium Channel Blockers Passed - 10/04/2018  9:43 AM      Passed - Last BP in normal range    BP Readings from Last 1 Encounters:  06/04/18 128/80         Passed - Valid encounter within last 6 months    Recent Outpatient Visits          4 months ago Acute suppurative otitis media of right ear    [facility] [place] Guse, Janna Arch, FNP   5 months ago Acute suppurative otitis media of right ear    [facility] [place] Guse, Janna Arch, FNP   6 months ago Routine physical examination   [facility] [place] Arnett, Lyn Records, FNP   8 months ago Essential hypertension   [facility]  [place], Lyn Records, FNP   1 year ago Acute upper respiratory infection   [facility] [place], [place], [place]

## 2018-12-20 ENCOUNTER — Other Ambulatory Visit: Payer: Self-pay | Admitting: Family

## 2018-12-20 DIAGNOSIS — F339 Major depressive disorder, recurrent, unspecified: Secondary | ICD-10-CM

## 2019-03-31 ENCOUNTER — Encounter: Payer: Self-pay | Admitting: Family Medicine

## 2019-03-31 ENCOUNTER — Ambulatory Visit (INDEPENDENT_AMBULATORY_CARE_PROVIDER_SITE_OTHER): Payer: Managed Care, Other (non HMO) | Admitting: Family Medicine

## 2019-03-31 ENCOUNTER — Other Ambulatory Visit: Payer: Self-pay

## 2019-03-31 VITALS — BP 138/84 | Temp 97.5°F | Ht 66.0 in | Wt 220.0 lb

## 2019-03-31 DIAGNOSIS — Z23 Encounter for immunization: Secondary | ICD-10-CM

## 2019-03-31 DIAGNOSIS — R252 Cramp and spasm: Secondary | ICD-10-CM

## 2019-03-31 DIAGNOSIS — I10 Essential (primary) hypertension: Secondary | ICD-10-CM

## 2019-03-31 DIAGNOSIS — Z0000 Encounter for general adult medical examination without abnormal findings: Secondary | ICD-10-CM

## 2019-03-31 DIAGNOSIS — Z1231 Encounter for screening mammogram for malignant neoplasm of breast: Secondary | ICD-10-CM

## 2019-03-31 DIAGNOSIS — E559 Vitamin D deficiency, unspecified: Secondary | ICD-10-CM

## 2019-03-31 NOTE — Progress Notes (Signed)
 Subjective:    Patient ID: Anna Mcgee, female    DOB: 10/01/1960, 58 y.o.   MRN: [id number]  HPI   Patient presents to clinic for CPE and for follow-up on hypertension.  Also complains of leg cramps.  Patient has not had a mammogram in many years.  Is agreeable to have been ordered today.  Overall tolerating all of her blood pressure medicine without any problems.  Denies chest pain, palpitations or lower extremity swelling.  Patient tries to keep self well-hydrated and eat a balanced diet, not sure why she gets leg cramps especially more so at night.  Has tried stretching and massaging legs.  Patient does see eye doctor every 1 to 2 years and dentist every 6 months.  Patient did a Cologuard in 2018 and had a Pap smear in 2019  Patient Active Problem List   Diagnosis Date Noted  . Screening mammogram, encounter for 01/13/2018  . Depression, recurrent (HCC) 01/12/2017  . Rash and nonspecific skin eruption 09/17/2015  . Insomnia 08/06/2015  . Paresthesia 08/06/2015  . History of gastric bypass 08/06/2015  . HTN (hypertension) 08/02/2015  . Adiposity 08/02/2015  . Avitaminosis D 08/02/2015  . Routine physical examination 08/02/2015  . Back ache 04/16/2012   Social History   Tobacco Use  . Smoking status: Former Smoker    Types: Cigarettes    Quit date: 08/01/2010    Years since quitting: 8.6  . Smokeless tobacco: Never Used  . Tobacco comment: Vapes  Substance Use Topics  . Alcohol use: Yes    Alcohol/week: 0.0 standard drinks    Comment: Rare   Family History  Problem Relation Age of Onset  . Hypertension Mother   . Hypertension Father   . Arthritis Maternal Grandmother   . Colon cancer Neg Hx    Past Surgical History:  Procedure Laterality Date  . GASTRIC BYPASS  2006     Review of Systems  Constitutional: Negative for chills, fatigue and fever.  HENT: Negative for congestion, ear pain, sinus pain and sore throat.   Eyes: Negative.   Respiratory:  Negative for cough, shortness of breath and wheezing.   Cardiovascular: Negative for chest pain, palpitations and leg swelling.  Gastrointestinal: Negative for abdominal pain, diarrhea, nausea and vomiting.  Genitourinary: Negative for dysuria, frequency and urgency.  Musculoskeletal: Negative for arthralgias and myalgias.  Skin: Negative for color change, pallor and rash.  Neurological: Negative for syncope, light-headedness and headaches.  Psychiatric/Behavioral: The patient is not nervous/anxious.       Objective:   Physical Exam Vitals signs and nursing note reviewed.  Constitutional:      General: She is not in acute distress.    Appearance: She is not ill-appearing or toxic-appearing.  HENT:     Head: Normocephalic and atraumatic.  Eyes:     General: No scleral icterus.    Extraocular Movements: Extraocular movements intact.     Conjunctiva/sclera: Conjunctivae normal.  Neck:     Musculoskeletal: Normal range of motion and neck supple. No neck rigidity.  Cardiovascular:     Rate and Rhythm: Normal rate and regular rhythm.  Pulmonary:     Effort: Pulmonary effort is normal. No respiratory distress.     Breath sounds: Normal breath sounds.  Chest:     Comments: Declines breast exam in office today Musculoskeletal: Normal range of motion.        General: No swelling, tenderness or deformity.     Right lower leg: No edema.       Left lower leg: No edema.  Lymphadenopathy:     Cervical: No cervical adenopathy.  Skin:    General: Skin is warm and dry.     Coloration: Skin is not jaundiced or pale.  Neurological:     General: No focal deficit present.     Mental Status: She is alert and oriented to person, place, and time.     Cranial Nerves: No cranial nerve deficit.     Motor: No weakness.     Gait: Gait normal.  Psychiatric:        Mood and Affect: Mood normal.        Behavior: Behavior normal.       Today's Vitals   03/31/19 0922 03/31/19 0935  BP: (!) 150/90  138/84  Temp: (!) 97.5 F (36.4 C)   TempSrc: Temporal   Weight: 220 lb (99.8 kg)   Height: 5' 6" (1.676 m)    Body mass index is 35.51 kg/m.     Assessment & Plan:   Patient will get mammogram this year.  We will get screening blood work and also to further investigate leg cramping.  Advised patient to keep up good fluid intake and eat a balanced diet with lots of fruits and vegetables.  Blood pressure is well controlled on current medication regimen.  Flu vaccine given in clinic.  She does see dentist and eye doctor regularly.  Always received blood in car.  Reviewed safe sun practices including SPF of at least 57 when outdoors.  1. Screening mammogram, encounter for  - MM Digital Screening; Future  2. Leg cramp  - CBC - Comp Met (CMET) - VITAMIN D 25 Hydroxy (Vit-D Deficiency, Fractures) - B12 and Folate Panel - Magnesium  3. Well adult exam  - CBC - Comp Met (CMET) - Lipid panel - Thyroid Panel With TSH; Future  4. Essential hypertension  - Comp Met (CMET) - Lipid panel  5. Need for immunization against influenza  - Flu Vaccine QUAD 36+ mos IM   She will keep regular follow-ups as planned with PCP, should be seen at least every 6 months due to medications that she is taking for chronic disease management

## 2019-04-08 ENCOUNTER — Other Ambulatory Visit: Payer: Self-pay | Admitting: Family

## 2019-04-08 DIAGNOSIS — F339 Major depressive disorder, recurrent, unspecified: Secondary | ICD-10-CM

## 2019-04-08 DIAGNOSIS — I10 Essential (primary) hypertension: Secondary | ICD-10-CM

## 2019-04-08 MED ORDER — BUPROPION HCL ER (XL) 300 MG PO TB24: 300.0000 mg | ORAL_TABLET | Freq: Every morning | ORAL | 0 refills | Status: DC

## 2019-04-08 MED ORDER — AMLODIPINE BESYLATE 2.5 MG PO TABS: 2.5000 mg | ORAL_TABLET | Freq: Every day | ORAL | 3 refills | Status: DC

## 2019-04-08 NOTE — Telephone Encounter (Signed)
Medication Refill - Medication:  buPROPion (WELLBUTRIN XL) 300 MG 24 hr tablet  amLODipine (NORVASC) 2.5 MG tablet    Has the patient contacted their pharmacy? Yes advised to call office.   Preferred Pharmacy (with phone number or street name):  [id number] [address], [place] [phone] (Phone) [phone] (Fax)     Agent: Please be advised that RX refills may take up to 3 business days. We ask that you follow-up with your pharmacy.

## 2019-04-22 LAB — B12 AND FOLATE PANEL
Folate: 13.5 ng/mL (ref >3.0)
Vitamin B-12: 309 pg/mL (ref 232–1245)

## 2019-04-22 LAB — COMPREHENSIVE METABOLIC PANEL
ALT: 13 IU/L (ref 0–32)
AST: 20 IU/L (ref 0–40)
Albumin/Globulin Ratio: 1.6 (ref 1.2–2.2)
Albumin: 4.4 g/dL (ref 3.8–4.9)
Alkaline Phosphatase: 86 IU/L (ref 39–117)
BUN/Creatinine Ratio: 24 — ABNORMAL HIGH (ref 9–23)
BUN: 16 mg/dL (ref 6–24)
Bilirubin Total: <0.2 mg/dL (ref 0.0–1.2)
CO2: 22 mmol/L (ref 20–29)
Calcium: 9.3 mg/dL (ref 8.7–10.2)
Chloride: 104 mmol/L (ref 96–106)
Creatinine, Ser: 0.67 mg/dL (ref 0.57–1.00)
GFR calc Af Amer: 111 mL/min/{1.73_m2} (ref >59)
GFR calc non Af Amer: 97 mL/min/{1.73_m2} (ref >59)
Globulin, Total: 2.8 g/dL (ref 1.5–4.5)
Glucose: 77 mg/dL (ref 65–99)
Potassium: 4.2 mmol/L (ref 3.5–5.2)
Sodium: 139 mmol/L (ref 134–144)
Total Protein: 7.2 g/dL (ref 6.0–8.5)

## 2019-04-22 LAB — CBC
Hematocrit: 33.5 % — ABNORMAL LOW (ref 34.0–46.6)
Hemoglobin: 10.8 g/dL — ABNORMAL LOW (ref 11.1–15.9)
MCH: 26.7 pg (ref 26.6–33.0)
MCHC: 32.2 g/dL (ref 31.5–35.7)
MCV: 83 fL (ref 79–97)
Platelets: 278 10*3/uL (ref 150–450)
RBC: 4.04 x10E6/uL (ref 3.77–5.28)
RDW: 14.5 % (ref 11.7–15.4)
WBC: 5.8 10*3/uL (ref 3.4–10.8)

## 2019-04-22 LAB — VITAMIN D 25 HYDROXY (VIT D DEFICIENCY, FRACTURES): Vit D, 25-Hydroxy: 13.4 ng/mL — ABNORMAL LOW (ref 30.0–100.0)

## 2019-04-22 LAB — LIPID PANEL
Chol/HDL Ratio: 1.7 ratio (ref 0.0–4.4)
Cholesterol, Total: 162 mg/dL (ref 100–199)
HDL: 98 mg/dL (ref >39)
LDL Chol Calc (NIH): 54 mg/dL (ref 0–99)
Triglycerides: 45 mg/dL (ref 0–149)
VLDL Cholesterol Cal: 10 mg/dL (ref 5–40)

## 2019-04-22 LAB — MAGNESIUM: Magnesium: 2 mg/dL (ref 1.6–2.3)

## 2019-04-26 MED ORDER — VITAMIN D (ERGOCALCIFEROL) 1.25 MG (50000 UNIT) PO CAPS: 50000.0000 [IU] | ORAL_CAPSULE | ORAL | 0 refills | Status: DC

## 2019-04-26 NOTE — Addendum Note (Signed)
Addended by: Philis Nettle on: 04/26/2019 05:13 PM   Modules accepted: Orders

## 2019-08-08 ENCOUNTER — Other Ambulatory Visit: Payer: Self-pay

## 2019-08-08 ENCOUNTER — Emergency Department
Admission: EM | Admit: 2019-08-08 | Discharge: 2019-08-08 | Disposition: A | Discharge status: Home / Self Care | Payer: Managed Care, Other (non HMO) | Attending: Emergency Medicine | Admitting: Emergency Medicine

## 2019-08-08 ENCOUNTER — Encounter: Payer: Self-pay | Admitting: Emergency Medicine

## 2019-08-08 DIAGNOSIS — Z5321 Procedure and treatment not carried out due to patient leaving prior to being seen by health care provider: Secondary | ICD-10-CM | POA: Insufficient documentation

## 2019-08-08 DIAGNOSIS — R1013 Epigastric pain: Secondary | ICD-10-CM | POA: Diagnosis present

## 2019-08-08 LAB — URINALYSIS, COMPLETE (UACMP) WITH MICROSCOPIC
Bacteria, UA: NONE SEEN
Bilirubin Urine: NEGATIVE
Glucose, UA: NEGATIVE mg/dL
Hgb urine dipstick: NEGATIVE
Ketones, ur: NEGATIVE mg/dL
Leukocytes,Ua: NEGATIVE
Nitrite: NEGATIVE
Protein, ur: NEGATIVE mg/dL
Specific Gravity, Urine: 1.032 — ABNORMAL HIGH (ref 1.005–1.030)
WBC, UA: NONE SEEN WBC/hpf (ref 0–5)
pH: 5 (ref 5.0–8.0)

## 2019-08-08 LAB — CBC
HCT: 35 % — ABNORMAL LOW (ref 36.0–46.0)
Hemoglobin: 11 g/dL — ABNORMAL LOW (ref 12.0–15.0)
MCH: 25.9 pg — ABNORMAL LOW (ref 26.0–34.0)
MCHC: 31.4 g/dL (ref 30.0–36.0)
MCV: 82.5 fL (ref 80.0–100.0)
Platelets: 291 10*3/uL (ref 150–400)
RBC: 4.24 MIL/uL (ref 3.87–5.11)
RDW: 15.5 % (ref 11.5–15.5)
WBC: 4.7 10*3/uL (ref 4.0–10.5)
nRBC: 0 % (ref 0.0–0.2)

## 2019-08-08 LAB — COMPREHENSIVE METABOLIC PANEL
ALT: 18 U/L (ref 0–44)
AST: 26 U/L (ref 15–41)
Albumin: 4.1 g/dL (ref 3.5–5.0)
Alkaline Phosphatase: 66 U/L (ref 38–126)
Anion gap: 9 (ref 5–15)
BUN: 18 mg/dL (ref 6–20)
CO2: 26 mmol/L (ref 22–32)
Calcium: 9.3 mg/dL (ref 8.9–10.3)
Chloride: 105 mmol/L (ref 98–111)
Creatinine, Ser: 1 mg/dL (ref 0.44–1.00)
GFR calc Af Amer: >60 mL/min (ref >60)
GFR calc non Af Amer: >60 mL/min (ref >60)
Glucose, Bld: 112 mg/dL — ABNORMAL HIGH (ref 70–99)
Potassium: 3.7 mmol/L (ref 3.5–5.1)
Sodium: 140 mmol/L (ref 135–145)
Total Bilirubin: 0.6 mg/dL (ref 0.3–1.2)
Total Protein: 7.3 g/dL (ref 6.5–8.1)

## 2019-08-08 LAB — TROPONIN I (HIGH SENSITIVITY): Troponin I (High Sensitivity): <2 ng/L (ref <18)

## 2019-08-08 LAB — LIPASE, BLOOD: Lipase: 28 U/L (ref 11–51)

## 2019-08-08 NOTE — ED Triage Notes (Signed)
Pt presents to ED with epigastric abd cramping/pain. Onset just after eating dinner tonight. Denies vomiting or nausea. Pt unable to sit due to her discomfort. Unsure of Last bowel movement.

## 2019-08-08 NOTE — ED Notes (Signed)
Pt ambulatory to STAT without difficulty or distress noted; pt reports relief of symptoms "after belching"; st leaving now and will f/u with PCP tomorrow; instr to return for any new or worsening symptoms

## 2019-08-09 ENCOUNTER — Telehealth: Payer: Self-pay | Admitting: Emergency Medicine

## 2019-08-09 NOTE — Telephone Encounter (Signed)
Called patient due to lwot to inquire about condition and follow up plans. Left message.   

## 2019-08-12 ENCOUNTER — Ambulatory Visit (INDEPENDENT_AMBULATORY_CARE_PROVIDER_SITE_OTHER): Payer: Managed Care, Other (non HMO) | Admitting: Family

## 2019-08-12 ENCOUNTER — Other Ambulatory Visit: Payer: Self-pay

## 2019-08-12 ENCOUNTER — Encounter: Payer: Self-pay | Admitting: Family

## 2019-08-12 VITALS — BP 152/80 | Ht 66.0 in | Wt 204.0 lb

## 2019-08-12 DIAGNOSIS — I10 Essential (primary) hypertension: Secondary | ICD-10-CM | POA: Diagnosis not present

## 2019-08-12 DIAGNOSIS — R195 Other fecal abnormalities: Secondary | ICD-10-CM | POA: Diagnosis not present

## 2019-08-12 DIAGNOSIS — M25519 Pain in unspecified shoulder: Secondary | ICD-10-CM | POA: Insufficient documentation

## 2019-08-12 DIAGNOSIS — M25511 Pain in right shoulder: Secondary | ICD-10-CM | POA: Diagnosis not present

## 2019-08-12 DIAGNOSIS — F339 Major depressive disorder, recurrent, unspecified: Secondary | ICD-10-CM | POA: Diagnosis not present

## 2019-08-12 DIAGNOSIS — D649 Anemia, unspecified: Secondary | ICD-10-CM | POA: Insufficient documentation

## 2019-08-12 DIAGNOSIS — M25512 Pain in left shoulder: Secondary | ICD-10-CM

## 2019-08-12 MED ORDER — AMLODIPINE BESYLATE 2.5 MG PO TABS: 5.0000 mg | ORAL_TABLET | Freq: Every day | ORAL | 3 refills | Status: DC

## 2019-08-12 NOTE — Patient Instructions (Addendum)
Stool cards, please return Please do iron labs when [facility] next week  Please trial over-the-counter Tylenol arthritis as well as Voltaren gel.   Please let me know if is not helpful as we can certainly refer you to orthopedics  Start amlodipine 5mg  and monitor blood pressure.  Goal < 120/80.  Please call call and schedule your 3D mammogram    [facility]  [address]  [place], [place]  [place]  Stay safe!

## 2019-08-12 NOTE — Assessment & Plan Note (Signed)
Doing well on wellbutrin - continue. 

## 2019-08-12 NOTE — Assessment & Plan Note (Signed)
Cramping resolved. Benign exam. Anemia noted at ED. Pending stool cards, iron studies. H/o gastric bypass, ? Contributory to anemia. Close follow up

## 2019-08-12 NOTE — Assessment & Plan Note (Signed)
Stable. Declines imaging or orthopedic referral today. 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 Advised heat, tylenol, and voltaren gel. She will let me know how she is doing.

## 2019-08-12 NOTE — Progress Notes (Signed)
Subjective:    Patient ID: Anna Mcgee, female    DOB: September 01, 1960, 59 y.o.   MRN: [id number]  CC: Anna Mcgee is a 59 y.o. female who presents today for follow up.   HPI: Follow up  Abdominal cramping 4 days ago.  This episode occurred after eating cabbage, grilled chicken. she felt a cramp all over the abdomen.  It lasted for 30 minutes in which she went to emergency room. however left prior to being seen after she burped, abdominal pain resolved. She has had no abdominal pain since  Denies blood in her stools, coffee ground stool, dyspepsia, vomiting fever or diarrhea.  She does note that the color of her stools is darker.   HTN- compliant with amlodipine, typically 120/80 at work.   Complains of BL shoulder pain. Occasional numbness.   Feels well on wellbutrin.  Due mammogram  UTD cologuard  Former smoker  H/o gastric bypass  HISTORY:  Past Medical History:  Diagnosis Date  . Heart murmur   . History of anemia   . Hypertension   . Postmenopausal bleeding   . Vitamin D deficiency   . Vitamin D deficiency    Past Surgical History:  Procedure Laterality Date  . GASTRIC BYPASS  2006   Family History  Problem Relation Age of Onset  . Hypertension Mother   . Hypertension Father   . Arthritis Maternal Grandmother   . Colon cancer Neg Hx     Allergies: Penicillins Current Outpatient Medications on File Prior to Visit  Medication Sig Dispense Refill  . buPROPion (WELLBUTRIN XL) 300 MG 24 hr tablet TAKE 1 TABLET BY MOUTH EVERY MORNING 90 tablet 0  . doxycycline (VIBRA-TABS) 100 MG tablet Take 1 tablet (100 mg total) by mouth 2 (two) times daily. 20 tablet 0  . loratadine (CLARITIN) 10 MG tablet Take 1 tablet (10 mg total) by mouth daily. 30 tablet 2   No current facility-administered medications on file prior to visit.    Social History   Tobacco Use  . Smoking status: Former Smoker    Types: Cigarettes    Quit date: 08/01/2010    Years since quitting:  9.0  . Smokeless tobacco: Never Used  . Tobacco comment: Vapes  Substance Use Topics  . Alcohol use: Yes    Alcohol/week: 0.0 standard drinks    Comment: Rare  . Drug use: No    Review of Systems  Constitutional: Negative for chills and fever.  Respiratory: Negative for cough.   Cardiovascular: Negative for chest pain and palpitations.  Gastrointestinal: Negative for abdominal pain, blood in stool, nausea and vomiting.      Objective:    BP (!) 152/80   Ht 5\' 6"  (1.676 m)   Wt 204 lb (92.5 kg)   BMI 32.93 kg/m  BP Readings from Last 3 Encounters:  08/12/19 (!) 152/80  08/08/19 126/85  03/31/19 138/84   Wt Readings from Last 3 Encounters:  08/12/19 204 lb (92.5 kg)  08/08/19 204 lb (92.5 kg)  03/31/19 220 lb (99.8 kg)    Physical Exam Vitals reviewed.  Constitutional:      Appearance: Normal appearance. She is well-developed.  Eyes:     Conjunctiva/sclera: Conjunctivae normal.  Cardiovascular:     Rate and Rhythm: Normal rate and regular rhythm.     Pulses: Normal pulses.     Heart sounds: Normal heart sounds.  Pulmonary:     Effort: Pulmonary effort is normal.     Breath  sounds: Normal breath sounds. No wheezing, rhonchi or rales.  Abdominal:     General: Bowel sounds are normal. There is no distension.     Palpations: Abdomen is soft. Abdomen is not rigid. There is no fluid wave or mass.     Tenderness: There is no abdominal tenderness. There is no guarding or rebound.  Skin:    General: Skin is warm and dry.  Neurological:     Mental Status: She is alert.  Psychiatric:        Speech: Speech normal.        Behavior: Behavior normal.        Thought Content: Thought content normal.        Assessment & Plan:   Problem List Items Addressed This Visit      Cardiovascular and Mediastinum   HTN (hypertension)    Uncontrolled. Reviewed prior blood pressures; advised increase amlodipine to 5mg .       Relevant Medications   amLODipine (NORVASC) 2.5 MG  tablet     Other   Change in stool - Primary    Cramping resolved. Benign exam. Anemia noted at ED. Pending stool cards, iron studies. H/o gastric bypass, ? Contributory to anemia. Close follow up      Relevant Orders   Fecal occult blood, imunochemical   Iron, TIBC and Ferritin Panel   Depression, recurrent (HCC)    Doing well on wellbutrin. continue      Shoulder pain    Stable. Declines imaging or orthopedic referral today. Discussed ergonomics at work and Environmental consultant. Advised heat, tylenol, and voltaren gel. She will let me know how she is doing.      Relevant Orders   B12 and Folate Panel       I have discontinued Lizmarie A. Erny's azithromycin, methylPREDNISolone, and Vitamin D (Ergocalciferol). I have also changed her amLODipine. Additionally, I am having her maintain her doxycycline, loratadine, and buPROPion.   Meds ordered this encounter  Medications  . amLODipine (NORVASC) 2.5 MG tablet    Sig: Take 2 tablets (5 mg total) by mouth daily.    Dispense:  90 tablet    Refill:  3    Order Specific Question:   Supervising Provider    Answer:   Crecencio Mc [[id number]]    Return precautions given.   Risks, benefits, and alternatives of the medications and treatment plan prescribed today were discussed, and patient expressed understanding.   Education regarding symptom management and diagnosis given to patient on AVS.  Continue to follow with Burnard Hawthorne, FNP for routine health maintenance.   Luan Pulling and I agreed with plan.   Mable Paris, FNP

## 2019-08-12 NOTE — Assessment & Plan Note (Signed)
Uncontrolled. Reviewed prior blood pressures; advised increase amlodipine to 5mg .

## 2019-08-18 ENCOUNTER — Telehealth: Payer: Self-pay | Admitting: Family

## 2019-08-18 DIAGNOSIS — M25511 Pain in right shoulder: Secondary | ICD-10-CM

## 2019-08-18 NOTE — Telephone Encounter (Signed)
Call patient Referral has been placed to [facility].  Please advise her to call the office in the next 7 days if she is not heard from [place] in regards to this appointment.  Please also advise her there is a walk-in clinic if she would like to go today   If your symptom do not improve, you may go to walk in orthopedic clinic. Information below:  [facility] [address]  M-F 1-7:30pm  [phone]

## 2019-08-18 NOTE — Addendum Note (Signed)
Addended by: Allegra Grana on: 08/18/2019 10:03 PM   Modules accepted: Orders

## 2019-08-18 NOTE — Telephone Encounter (Signed)
FYI patient would like referral discusses placed for [facility].

## 2019-08-18 NOTE — Telephone Encounter (Signed)
Pt said that she talked to [facility] about a Orthopedic referral and she would like that placed to [facility]

## 2019-08-19 NOTE — Telephone Encounter (Signed)
I called patient & informed of below. Patient verbalized understanding.

## 2019-09-07 ENCOUNTER — Ambulatory Visit: Payer: Managed Care, Other (non HMO) | Attending: Physician Assistant | Admitting: Physical Therapy

## 2019-09-07 ENCOUNTER — Other Ambulatory Visit: Payer: Self-pay

## 2019-09-07 VITALS — BP 152/70 | HR 74

## 2019-09-07 DIAGNOSIS — M6281 Muscle weakness (generalized): Secondary | ICD-10-CM | POA: Diagnosis present

## 2019-09-07 DIAGNOSIS — M5412 Radiculopathy, cervical region: Secondary | ICD-10-CM | POA: Insufficient documentation

## 2019-09-07 DIAGNOSIS — R202 Paresthesia of skin: Secondary | ICD-10-CM | POA: Diagnosis present

## 2019-09-07 DIAGNOSIS — M542 Cervicalgia: Secondary | ICD-10-CM | POA: Diagnosis present
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
## 2019-09-19 ENCOUNTER — Ambulatory Visit: Payer: Managed Care, Other (non HMO) | Admitting: Physical Therapy

## 2019-09-21 ENCOUNTER — Ambulatory Visit: Payer: Managed Care, Other (non HMO) | Admitting: Physical Therapy

## 2019-09-28 ENCOUNTER — Other Ambulatory Visit: Payer: Self-pay

## 2019-09-28 ENCOUNTER — Ambulatory Visit: Payer: Managed Care, Other (non HMO) | Attending: Physician Assistant

## 2019-09-28 DIAGNOSIS — M542 Cervicalgia: Secondary | ICD-10-CM | POA: Diagnosis present

## 2019-09-28 DIAGNOSIS — M5412 Radiculopathy, cervical region: Secondary | ICD-10-CM | POA: Diagnosis present

## 2019-09-28 NOTE — Therapy (Signed)
[facility] [address] [place], [address] Phone: [phone]   Fax:  [phone]  Physical Therapy Treatment  Patient Details  Name: Anna Mcgee MRN: [id number] Date of Birth: 09/09/1960 Referring Provider (PT): Jennet Maduro, PA-C    Encounter Date: 09/28/2019  PT End of Session - 09/28/19 1856    Visit Number  22    Number of Visits  24    Date for PT Re-Evaluation  11/30/19    Authorization Type  [employer] reporting period from 09/07/2019    Authorization Time Period  calendar year    Authorization - Visit Number  2    Authorization - Number of Visits  60    Progress Note Due on Visit  10    PT Start Time  1800    PT Stop Time  1845    PT Time Calculation (min)  45 min    Activity Tolerance  Patient tolerated treatment well    Behavior During Therapy  [facility] for tasks assessed/performed       Past Medical History:  Diagnosis Date  . Heart murmur   . History of anemia   . Hypertension   . Postmenopausal bleeding   . Vitamin D deficiency   . Vitamin D deficiency     Past Surgical History:  Procedure Laterality Date  . GASTRIC BYPASS  2006    There were no vitals filed for this visit.  Subjective Assessment - 09/28/19 1832    Subjective  Patient states she continues to have increase in pain along the upper trap and pec along the affected R side. Patient states it increases over the work day.    Pertinent History  Patient is a 59 y.o. female who presents to outpatient physical therapy with a referral for medical diagnosis cervical radiculopathy. This patient's chief complaints consist of bilateral neck pain and intermittant R arm and lateral R trunk pain and intermittent left hand paresthesia leading to the following functional deficits: irritation with all activities including difficulty with prolonged looking down, working, using B UEs, prolonged activity. Relevant past medical history and  comorbidities include prior low back pain (over 10 years ago), gastric bypass (2006), former smoker, HTN, vitamin D deficiency, shoulder pain, anemia, heart murmur. Patient denies hx of spinal surgery, cancer, stroke, seizures, lung problem, major cardiac events, diabetes, unexplained weight loss, changes in bowel or bladder problems, new onset stumbling or dropping things apart from described below.    Limitations  Reading;Lifting;Writing;Other (comment)   Functional Limitations: condition is irritating while doing all activities, especially prolonged looking down, reading, using phone, computer, work activities, using BUEs for reaching, phlebotomy, etc.   Diagnostic tests  radiograph shows arthritis and maybe bulging disc per pt    Patient Stated Goals  "someone can tell me how to prevent this from happening again or what to do to stop it if it comes back" wants discomfort to go away    Currently in Pain?  No/denies    Pain Onset  More than a month ago       TREATMENT Therapeutic Exercise Pec stretch in doorway -- 2 x 10  Pec stretch in corner -- 2 x 10  Scapular retraction in sitting -- 2 x 10  Median nerve flossing in waitors pose -- 2 x 10  Cervical retraction with OP from fingers -- 2 x 10  Cervical retraction into cervical extension -- 2 x 10  Behind the back scapular  retraction with hands together pushing down -- 2 x 10   Manual therapy STM performed to UT to improve pain and spasms with patients positioned in supine to decreased pain and spasms.     PT Education - 09/28/19 1855    Education Details  form/technique with exercise; pec stretch in doorway, Median nerve stretch waitors pose, scapular retraction    Person(s) Educated  Patient    Methods  Explanation;Demonstration    Comprehension  Verbalized understanding;Returned demonstration       PT Short Term Goals - 09/07/19 2016      PT SHORT TERM GOAL #1   Title  Be independent with initial home exercise program for  self-management of symptoms.    Baseline  initial HEP provieded at IE    Time  2    Period  Weeks    Status  New    Target Date  09/21/19        PT Long Term Goals - 09/07/19 2017      PT LONG TERM GOAL #1   Title  Be independent with a long-term home exercise program for self-management of symptoms.   ALL LONG TERM GOALS TARGET DATE: 11/30/2019   Baseline  Initial HEP provided at IE (09/07/2019);    Time  12    Period  Weeks    Status  New      PT LONG TERM GOAL #2   Title  Demonstrate improved FOTO score to equal or greater than 100 to demonstrate improvement in overall condition and self-reported functional ability.    Baseline  FOTO = 96 (09/07/2019);    Time  12    Period  Weeks    Status  New      PT LONG TERM GOAL #3   Title  Patient will demonstrate improved cervical spine AROM at least 5 degrees each direction with no increase in pain except mild end range discomfort in order to improve ability to check blind spot, read, and look overhead.    Baseline  see objective exam - limited and painful (09/07/2019);    Time  12    Period  Weeks    Status  New      PT LONG TERM GOAL #4   Title  Patient will report resolution of paresthesia in the the left UE for at least 2 weeks to improve pt's ability to complete work activities without difficulty.    Baseline  Intermittant left UE paresthesia throughout the day (09/07/2019);    Time  12    Period  Weeks      PT LONG TERM GOAL #5   Title  Complete community, work and/or recreational activities without limitation due to current condition.    Baseline  condition is irritating while doing all activities, especially prolonged looking down, reading, using phone, computer, work activities, using [facility] for reaching, phlebotomy, etc (09/07/2019);    Time  12    Period  Weeks            Plan - 09/28/19 1856    Clinical Impression Statement  Exercises performed today to address median nerve mobility, greater onset of symptoms into  shoulder extension; this improved with exercises performed. Concondorant pain onset with pec stretch indicating possible involvement. Patient will benefit from furhter skilled therapy focused on improving movements into extension and retraction to improve overall symptoms.    Personal Factors and Comorbidities  Age;Comorbidity 3+;Past/Current Experience;Fitness;Education;Finances    Comorbidities  Relevant past medical history and  comorbidities include prior low back pain (over 10 years ago), gastric bypass (2006), former smoker, HTN, vitamin D deficiency, shoulder pain, anemia, heart murmur.    Examination-Activity Limitations  Caring for Others;Reach Overhead;Lift;Hygiene/Grooming;Dressing    Examination-Participation Restrictions  Community Activity;Other;Interpersonal Relationship;Cleaning   condition is irritating while doing all activities, especially prolonged looking down, reading, using phone, computer, work activities, using BUEs for reaching, phlebotomy, etc.   Stability/Clinical Decision Making  Stable/Uncomplicated    Rehab Potential  Good    PT Frequency  2x / week   pt states she can only come 1 x per week due to financial limitations   PT Duration  12 weeks   up to 12 weeks as needed   PT Treatment/Interventions  ADLs/Self Care Home Management;Aquatic Therapy;Biofeedback;Cryotherapy;Electrical Stimulation;Moist Heat;Traction;Therapeutic activities;Therapeutic exercise;Neuromuscular re-education;Patient/family education;Manual techniques;Passive range of motion;Dry needling;Taping;Spinal Manipulations;Joint Manipulations    PT Next Visit Plan  review and update HEP as needed, manual therapy, nerve glides, progressive postural strengthening as tolerated.    PT Home Exercise Plan  Medbridge Access Code: [email or web address]    Consulted and Agree with Plan of Care  Patient       Patient will benefit from skilled therapeutic intervention in order to improve the following deficits and impairments:   Increased fascial restricitons, Pain, Improper body mechanics, Postural dysfunction, Increased muscle spasms, Decreased coordination, Decreased activity tolerance, Decreased endurance, Decreased range of motion, Decreased strength, Hypomobility, Impaired perceived functional ability, Impaired UE functional use, Impaired flexibility, Impaired sensation  Visit Diagnosis: Cervicalgia  Radiculopathy, cervical region     Problem List Patient Active Problem List   Diagnosis Date Noted  . Change in stool 08/12/2019  . Shoulder pain 08/12/2019  . Screening mammogram, encounter for 01/13/2018  . Depression, recurrent (HCC) 01/12/2017  . Rash and nonspecific skin eruption 09/17/2015  . Insomnia 08/06/2015  . Paresthesia 08/06/2015  . History of gastric bypass 08/06/2015  . HTN (hypertension) 08/02/2015  . Adiposity 08/02/2015  . Avitaminosis D 08/02/2015  . Routine physical examination 08/02/2015  . Back ache 04/16/2012    Myrene Galas, PT DPT 09/28/2019, 6:59 PM  [place] [facility] PHYS[facility] AND SPORTS MEDICINE [address] [place], [address] Phone: [phone]   Fax:  [phone]  Name: Anna Mcgee MRN: [id number] Date of Birth: Sep 02, 1960

## 2019-10-05 ENCOUNTER — Encounter: Payer: Managed Care, Other (non HMO) | Admitting: Physical Therapy

## 2019-10-05 ENCOUNTER — Ambulatory Visit: Payer: Managed Care, Other (non HMO)

## 2019-10-11 ENCOUNTER — Telehealth: Payer: Self-pay | Admitting: Family

## 2019-10-11 ENCOUNTER — Other Ambulatory Visit: Payer: Self-pay

## 2019-10-11 DIAGNOSIS — F339 Major depressive disorder, recurrent, unspecified: Secondary | ICD-10-CM

## 2019-10-11 DIAGNOSIS — I10 Essential (primary) hypertension: Secondary | ICD-10-CM

## 2019-10-11 MED ORDER — BUPROPION HCL ER (XL) 300 MG PO TB24: 300.0000 mg | ORAL_TABLET | Freq: Every morning | ORAL | 0 refills | Status: DC

## 2019-10-11 MED ORDER — AMLODIPINE BESYLATE 2.5 MG PO TABS: 5.0000 mg | ORAL_TABLET | Freq: Every day | ORAL | 3 refills | Status: DC

## 2019-10-11 NOTE — Telephone Encounter (Signed)
Pt needs a refill on amLODipine (NORVASC) 2.5 MG tablet and buPROPion (WELLBUTRIN XL) 300 MG 24 hr tablet sent to [facility]

## 2019-10-11 NOTE — Telephone Encounter (Signed)
LVM that prescriptions were sent in to [facility] for patient.

## 2019-10-12 ENCOUNTER — Ambulatory Visit: Payer: Managed Care, Other (non HMO)

## 2019-10-12 ENCOUNTER — Other Ambulatory Visit: Payer: Self-pay

## 2019-10-12 DIAGNOSIS — M542 Cervicalgia: Secondary | ICD-10-CM

## 2019-10-12 DIAGNOSIS — M5412 Radiculopathy, cervical region: Secondary | ICD-10-CM

## 2019-10-12 NOTE — Therapy (Signed)
[place] [facility] SPORTS [facility]IC[facility] [address], [place], [address] Phone: [phone]   Fax:  [phone]  Physical Therapy Treatment  Patient Details  Name: Anna Mcgee MRN: [id number] Date of Birth: 10-Mar-1961 Referring Provider (PT): Tessa Lerner, PA-C    Encounter Date: 10/12/2019  PT End of Session - 10/12/19 1743    Visit Number  3    Number of Visits  24    Date for PT Re-Evaluation  11/30/19    Authorization Type  [employer] reporting period from 09/07/2019    Authorization Time Period  calendar year    Authorization - Visit Number  3    Authorization - Number of Visits  60    Progress Note Due on Visit  10    PT Start Time  3536    PT Stop Time  1800    PT Time Calculation (min)  45 min    Activity Tolerance  Patient tolerated treatment well    Behavior During Therapy  [facility] for tasks assessed/performed       Past Medical History:  Diagnosis Date  . Heart murmur   . History of anemia   . Hypertension   . Postmenopausal bleeding   . Vitamin D deficiency   . Vitamin D deficiency     Past Surgical History:  Procedure Laterality Date  . GASTRIC BYPASS  2006    There were no vitals filed for this visit.  Subjective Assessment - 10/12/19 1718    Subjective  Patient states she was on vacation last week. With increased pain along the L and R upper trap B and along the neck.    Pertinent History  Patient is a 59 y.o. female who presents to outpatient physical therapy with a referral for medical diagnosis cervical radiculopathy. This patient's chief complaints consist of bilateral neck pain and intermittant R arm and lateral R trunk pain and intermittent left hand paresthesia leading to the following functional deficits: irritation with all activities including difficulty with prolonged looking down, working, using B UEs, prolonged activity. Relevant past medical history and comorbidities include prior low back pain (over  10 years ago), gastric bypass (2006), former smoker, HTN, vitamin D deficiency, shoulder pain, anemia, heart murmur. Patient denies hx of spinal surgery, cancer, stroke, seizures, lung problem, major cardiac events, diabetes, unexplained weight loss, changes in bowel or bladder problems, new onset stumbling or dropping things apart from described below.    Limitations  Reading;Lifting;Writing;Other (comment)   Functional Limitations: condition is irritating while doing all activities, especially prolonged looking down, reading, using phone, computer, work activities, using BUEs for reaching, phlebotomy, etc.   Diagnostic tests  radiograph shows arthritis and maybe bulging disc per pt    Patient Stated Goals  "someone can tell me how to prevent this from happening again or what to do to stop it if it comes back" wants discomfort to go away    Currently in Pain?  Yes    Pain Score  8     Pain Location  Neck    Pain Orientation  Right;Left    Pain Descriptors / Indicators  Aching    Pain Type  Acute pain    Pain Onset  More than a month ago    Pain Frequency  Intermittent           Therapeutic Exercise Scapular retraction in standing through Full AROM - 2 x 10  SNAGs in standing pushing into cervical extension,  side bending, rotationals - x 10  Behind the back scapular retraction - 2 x 10  Doorway pec stretch - x 10 with 5 sec holds Median waiters pose glide - x 10  Performed exercises to decrease increased spasms and pain along the UT    Manual therapy STM performed to UT to improve pain and spasms with patients positioned in supine to decreased pain and spasms.   DN Dry Needling with patient positioned in sitting to decrease pain and spasms along the UTs B .53mm x 71mm needles with minimal pistoning secondary to patient comfort.   PT Education - 10/12/19 1742    Education Details  form/technique with exercise    Person(s) Educated  Patient    Methods  Explanation;Demonstration     Comprehension  Verbalized understanding;Returned demonstration       PT Short Term Goals - 09/07/19 2016      PT SHORT TERM GOAL #1   Title  Be independent with initial home exercise program for self-management of symptoms.    Baseline  initial HEP provieded at IE    Time  2    Period  Weeks    Status  New    Target Date  09/21/19        PT Long Term Goals - 09/07/19 2017      PT LONG TERM GOAL #1   Title  Be independent with a long-term home exercise program for self-management of symptoms.   ALL LONG TERM GOALS TARGET DATE: 11/30/2019   Baseline  Initial HEP provided at IE (09/07/2019);    Time  12    Period  Weeks    Status  New      PT LONG TERM GOAL #2   Title  Demonstrate improved FOTO score to equal or greater than 100 to demonstrate improvement in overall condition and self-reported functional ability.    Baseline  FOTO = 96 (09/07/2019);    Time  12    Period  Weeks    Status  New      PT LONG TERM GOAL #3   Title  Patient will demonstrate improved cervical spine AROM at least 5 degrees each direction with no increase in pain except mild end range discomfort in order to improve ability to check blind spot, read, and look overhead.    Baseline  see objective exam - limited and painful (09/07/2019);    Time  12    Period  Weeks    Status  New      PT LONG TERM GOAL #4   Title  Patient will report resolution of paresthesia in the the left UE for at least 2 weeks to improve pt's ability to complete work activities without difficulty.    Baseline  Intermittant left UE paresthesia throughout the day (09/07/2019);    Time  12    Period  Weeks      PT LONG TERM GOAL #5   Title  Complete community, work and/or recreational activities without limitation due to current condition.    Baseline  condition is irritating while doing all activities, especially prolonged looking down, reading, using phone, computer, work activities, using BUEs for reaching, phlebotomy, etc  (09/07/2019);    Time  12    Period  Weeks            Plan - 10/12/19 1808    Clinical Impression Statement  Increased pain upon palpation upon the UT, performed manual therapy and dry needling to address  this, decreased pain after wards but states increased soreness. Patient demonstrates no reproduction of median nerve symptoms with waitor's pose today indicating carryover between sessions. She is also able to perform exercises throughout greater AROM. Patient will benefit from further skiled therapy to return to prior level of function.    Personal Factors and Comorbidities  Age;Comorbidity 3+;Past/Current Experience;Fitness;Education;Finances    Comorbidities  Relevant past medical history and comorbidities include prior low back pain (over 10 years ago), gastric bypass (2006), former smoker, HTN, vitamin D deficiency, shoulder pain, anemia, heart murmur.    Examination-Activity Limitations  Caring for Others;Reach Overhead;Lift;Hygiene/Grooming;Dressing    Examination-Participation Restrictions  Community Activity;Other;Interpersonal Relationship;Cleaning   condition is irritating while doing all activities, especially prolonged looking down, reading, using phone, computer, work activities, using BUEs for reaching, phlebotomy, etc.   Stability/Clinical Decision Making  Stable/Uncomplicated    Rehab Potential  Good    PT Frequency  2x / week   pt states she can only come 1 x per week due to financial limitations   PT Duration  12 weeks   up to 12 weeks as needed   PT Treatment/Interventions  ADLs/Self Care Home Management;Aquatic Therapy;Biofeedback;Cryotherapy;Electrical Stimulation;Moist Heat;Traction;Therapeutic activities;Therapeutic exercise;Neuromuscular re-education;Patient/family education;Manual techniques;Passive range of motion;Dry needling;Taping;Spinal Manipulations;Joint Manipulations    PT Next Visit Plan  review and update HEP as needed, manual therapy, nerve glides,  progressive postural strengthening as tolerated.    PT Home Exercise Plan  Medbridge Access Code: [email or web address]    Consulted and Agree with Plan of Care  Patient       Patient will benefit from skilled therapeutic intervention in order to improve the following deficits and impairments:  Increased fascial restricitons, Pain, Improper body mechanics, Postural dysfunction, Increased muscle spasms, Decreased coordination, Decreased activity tolerance, Decreased endurance, Decreased range of motion, Decreased strength, Hypomobility, Impaired perceived functional ability, Impaired UE functional use, Impaired flexibility, Impaired sensation  Visit Diagnosis: Cervicalgia  Radiculopathy, cervical region     Problem List Patient Active Problem List   Diagnosis Date Noted  . Change in stool 08/12/2019  . Shoulder pain 08/12/2019  . Screening mammogram, encounter for 01/13/2018  . Depression, recurrent (HCC) 01/12/2017  . Rash and nonspecific skin eruption 09/17/2015  . Insomnia 08/06/2015  . Paresthesia 08/06/2015  . History of gastric bypass 08/06/2015  . HTN (hypertension) 08/02/2015  . Adiposity 08/02/2015  . Avitaminosis D 08/02/2015  . Routine physical examination 08/02/2015  . Back ache 04/16/2012    Myrene Galas, PT DPT 10/12/2019, 6:12 PM  [place] [facility] PHYSICAL AND SPORTS MEDICINE [address] [place], [address] Phone: [phone]   Fax:  [phone]  Name: JARELYN BAMBACH MRN: [id number] Date of Birth: 09/16/1960

## 2019-10-17 ENCOUNTER — Other Ambulatory Visit: Payer: Self-pay

## 2019-10-17 ENCOUNTER — Ambulatory Visit: Payer: Managed Care, Other (non HMO) | Attending: Physician Assistant

## 2019-10-17 DIAGNOSIS — M5412 Radiculopathy, cervical region: Secondary | ICD-10-CM | POA: Diagnosis present

## 2019-10-17 DIAGNOSIS — M542 Cervicalgia: Secondary | ICD-10-CM | POA: Insufficient documentation

## 2019-10-18 NOTE — Therapy (Signed)
[place] [facility] [address] [place], [address] Phone: [phone]   Fax:  [phone]  Physical Therapy Treatment  Patient Details  Name: Anna Mcgee MRN: [id number] Date of Birth: Jan 12, 1961 Referring Provider (PT): Jennet Maduro, PA-C    Encounter Date: 10/17/2019  PT End of Session - 10/18/19 1013    Visit Number  4    Number of Visits  24    Date for PT Re-Evaluation  11/30/19    Authorization Type  [employer] reporting period from 09/07/2019    Authorization Time Period  calendar year    Authorization - Visit Number  4    Authorization - Number of Visits  60    Progress Note Due on Visit  10    PT Start Time  1830    PT Stop Time  1915    PT Time Calculation (min)  45 min    Activity Tolerance  Patient tolerated treatment well    Behavior During Therapy  [facility] for tasks assessed/performed       Past Medical History:  Diagnosis Date  . Heart murmur   . History of anemia   . Hypertension   . Postmenopausal bleeding   . Vitamin D deficiency   . Vitamin D deficiency     Past Surgical History:  Procedure Laterality Date  . GASTRIC BYPASS  2006    There were no vitals filed for this visit.  Subjective Assessment - 10/18/19 1011    Subjective  Patient states signifcant improvement of pain to a 2/10 after the previous session. Patient states pain has been improved since.    Pertinent History  Patient is a 59 y.o. female who presents to outpatient physical therapy with a referral for medical diagnosis cervical radiculopathy. This patient's chief complaints consist of bilateral neck pain and intermittant R arm and lateral R trunk pain and intermittent left hand paresthesia leading to the following functional deficits: irritation with all activities including difficulty with prolonged looking down, working, using B UEs, prolonged activity. Relevant past medical history and comorbidities include prior low back  pain (over 10 years ago), gastric bypass (2006), former smoker, HTN, vitamin D deficiency, shoulder pain, anemia, heart murmur. Patient denies hx of spinal surgery, cancer, stroke, seizures, lung problem, major cardiac events, diabetes, unexplained weight loss, changes in bowel or bladder problems, new onset stumbling or dropping things apart from described below.    Limitations  Reading;Lifting;Writing;Other (comment)   Functional Limitations: condition is irritating while doing all activities, especially prolonged looking down, reading, using phone, computer, work activities, using BUEs for reaching, phlebotomy, etc.   Diagnostic tests  radiograph shows arthritis and maybe bulging disc per pt    Patient Stated Goals  "someone can tell me how to prevent this from happening again or what to do to stop it if it comes back" wants discomfort to go away    Currently in Pain?  Yes    Pain Score  2     Pain Location  Neck    Pain Orientation  Right;Left    Pain Descriptors / Indicators  Aching    Pain Type  Acute pain    Pain Onset  More than a month ago         TREATMENT Therapeutic Exercise Scapular punches in standing with use of GTB --x 15 Scapular push up PLUS at wall -- x 15 with therapist support Straight arm push downs at [facility] 15# --  x 20  Scapular rows in standing 15# -- x 20  Thoracic extension on standing with use of a towel -- x 20  B Shoulder ER with GTB -- x 20  Manual therapy  STM performed to UT to improve pain and spasms with patients positioned in supine to decreased pain and spasms. DN Dry Needling with patient positioned in sitting to decrease pain and spasms along the UTs B .3mm x 78mm needles with minimal pistoning secondary to patient comfort.       PT Education - 10/18/19 1012    Education Details  form/technique with exercise; B shoulder ER with GTB    Person(s) Educated  Patient    Methods  Explanation;Demonstration    Comprehension  Verbalized  understanding;Returned demonstration       PT Short Term Goals - 09/07/19 2016      PT SHORT TERM GOAL #1   Title  Be independent with initial home exercise program for self-management of symptoms.    Baseline  initial HEP provieded at IE    Time  2    Period  Weeks    Status  New    Target Date  09/21/19        PT Long Term Goals - 09/07/19 2017      PT LONG TERM GOAL #1   Title  Be independent with a long-term home exercise program for self-management of symptoms.   ALL LONG TERM GOALS TARGET DATE: 11/30/2019   Baseline  Initial HEP provided at IE (09/07/2019);    Time  12    Period  Weeks    Status  New      PT LONG TERM GOAL #2   Title  Demonstrate improved FOTO score to equal or greater than 100 to demonstrate improvement in overall condition and self-reported functional ability.    Baseline  FOTO = 96 (09/07/2019);    Time  12    Period  Weeks    Status  New      PT LONG TERM GOAL #3   Title  Patient will demonstrate improved cervical spine AROM at least 5 degrees each direction with no increase in pain except mild end range discomfort in order to improve ability to check blind spot, read, and look overhead.    Baseline  see objective exam - limited and painful (09/07/2019);    Time  12    Period  Weeks    Status  New      PT LONG TERM GOAL #4   Title  Patient will report resolution of paresthesia in the the left UE for at least 2 weeks to improve pt's ability to complete work activities without difficulty.    Baseline  Intermittant left UE paresthesia throughout the day (09/07/2019);    Time  12    Period  Weeks      PT LONG TERM GOAL #5   Title  Complete community, work and/or recreational activities without limitation due to current condition.    Baseline  condition is irritating while doing all activities, especially prolonged looking down, reading, using phone, computer, work activities, using BUEs for reaching, phlebotomy, etc (09/07/2019);    Time  12     Period  Weeks            Plan - 10/18/19 1014    Clinical Impression Statement  Improvement in scapular retraction with exercises performed today however patient continues to have increased activation of the affected UT resulting in increased  shoulder elevation indicating decreased motor control. 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 Patient will benefit from furhter skilled therapy to return to prior level of function.    Personal Factors and Comorbidities  Age;Comorbidity 3+;Past/Current Experience;Fitness;Education;Finances    Comorbidities  Relevant past medical history and comorbidities include prior low back pain (over 10 years ago), gastric bypass (2006), former smoker, HTN, vitamin D deficiency, shoulder pain, anemia, heart murmur.    Examination-Activity Limitations  Caring for Others;Reach Overhead;Lift;Hygiene/Grooming;Dressing    Examination-Participation Restrictions  Community Activity;Other;Interpersonal Relationship;Cleaning   condition is irritating while doing all activities, especially prolonged looking down, reading, using phone, computer, work activities, using BUEs for reaching, phlebotomy, etc.   Stability/Clinical Decision Making  Stable/Uncomplicated    Rehab Potential  Good    PT Frequency  2x / week   pt states she can only come 1 x per week due to financial limitations   PT Duration  12 weeks   up to 12 weeks as needed   PT Treatment/Interventions  ADLs/Self Care Home Management;Aquatic Therapy;Biofeedback;Cryotherapy;Electrical Stimulation;Moist Heat;Traction;Therapeutic activities;Therapeutic exercise;Neuromuscular re-education;Patient/family education;Manual techniques;Passive range of motion;Dry needling;Taping;Spinal Manipulations;Joint Manipulations    PT Next Visit Plan  review and update HEP  as needed, manual therapy, nerve glides, progressive postural strengthening as tolerated.    PT Home Exercise Plan  Medbridge Access Code: [email or web address]    Consulted and Agree with Plan of Care  Patient       Patient will benefit from skilled therapeutic intervention in order to improve the following deficits and impairments:  Increased fascial restricitons, Pain, Improper body mechanics, Postural dysfunction, Increased muscle spasms, Decreased coordination, Decreased activity tolerance, Decreased endurance, Decreased range of motion, Decreased strength, Hypomobility, Impaired perceived functional ability, Impaired UE functional use, Impaired flexibility, Impaired sensation  Visit Diagnosis: Cervicalgia  Radiculopathy, cervical region     Problem List Patient Active Problem List   Diagnosis Date Noted  . Change in stool 08/12/2019  . Shoulder pain 08/12/2019  . Screening mammogram, encounter for 01/13/2018  . Depression, recurrent ([place]) 01/12/2017  . Rash and nonspecific skin eruption 09/17/2015  . Insomnia 08/06/2015  . Paresthesia 08/06/2015  . History of gastric bypass 08/06/2015  . HTN (hypertension) 08/02/2015  . Adiposity 08/02/2015  . Avitaminosis D 08/02/2015  . Routine physical examination 08/02/2015  . Back ache 04/16/2012    Blythe Stanford, PT DPT 10/18/2019, 10:17 AM  [place] [facility] [address] [place], [address] Phone: [phone]   Fax:  [phone]  Name: Anna Mcgee MRN: [id number] Date of Birth: Feb 04, 1961

## 2019-10-26 ENCOUNTER — Ambulatory Visit: Payer: Managed Care, Other (non HMO) | Admitting: Physical Therapy

## 2019-10-26 ENCOUNTER — Ambulatory Visit: Payer: Managed Care, Other (non HMO)

## 2019-11-02 ENCOUNTER — Ambulatory Visit: Payer: Managed Care, Other (non HMO)

## 2019-11-02 ENCOUNTER — Other Ambulatory Visit: Payer: Self-pay

## 2019-11-02 ENCOUNTER — Encounter: Payer: Managed Care, Other (non HMO) | Admitting: Physical Therapy

## 2019-11-02 DIAGNOSIS — M542 Cervicalgia: Secondary | ICD-10-CM | POA: Diagnosis not present

## 2019-11-02 DIAGNOSIS — M5412 Radiculopathy, cervical region: Secondary | ICD-10-CM

## 2019-11-02 NOTE — Therapy (Signed)
[facility] [address], [place], [address] Phone: [phone]   Fax:  [phone]  Physical Therapy Treatment  Patient Details  Name: Anna Mcgee MRN: [id number] Date of Birth: Jun 23, 1960 Referring Provider (PT): Jennet Maduro, PA-C    Encounter Date: 11/02/2019  PT End of Session - 11/02/19 1739    Visit Number  5    Number of Visits  24    Date for PT Re-Evaluation  11/30/19    Authorization Type  [employer] reporting period from 09/07/2019    Authorization Time Period  calendar year    Authorization - Visit Number  5    Authorization - Number of Visits  60    Progress Note Due on Visit  10    PT Start Time  1712    PT Stop Time  1730    PT Time Calculation (min)  18 min    Activity Tolerance  Patient tolerated treatment well    Behavior During Therapy  [facility] for tasks assessed/performed       Past Medical History:  Diagnosis Date  . Heart murmur   . History of anemia   . Hypertension   . Postmenopausal bleeding   . Vitamin D deficiency   . Vitamin D deficiency     Past Surgical History:  Procedure Laterality Date  . GASTRIC BYPASS  2006    There were no vitals filed for this visit.  Subjective Assessment - 11/02/19 1736    Subjective  Patient states she had a death in her family and needs to leave early secondary to this fact. Patient states she has been receiving pain relief from dry needling and would like to do that during today's session.    Pertinent History  Patient is a 59 y.o. female who presents to outpatient physical therapy with a referral for medical diagnosis cervical radiculopathy. This patient's chief complaints consist of bilateral neck pain and intermittant R arm and lateral R trunk pain and intermittent left hand paresthesia leading to the following functional deficits: irritation with all activities including difficulty with prolonged looking down, working, using B UEs,  prolonged activity. Relevant past medical history and comorbidities include prior low back pain (over 10 years ago), gastric bypass (2006), former smoker, HTN, vitamin D deficiency, shoulder pain, anemia, heart murmur. Patient denies hx of spinal surgery, cancer, stroke, seizures, lung problem, major cardiac events, diabetes, unexplained weight loss, changes in bowel or bladder problems, new onset stumbling or dropping things apart from described below.    Limitations  Reading;Lifting;Writing;Other (comment)   Functional Limitations: condition is irritating while doing all activities, especially prolonged looking down, reading, using phone, computer, work activities, using BUEs for reaching, phlebotomy, etc.   Diagnostic tests  radiograph shows arthritis and maybe bulging disc per pt    Patient Stated Goals  "someone can tell me how to prevent this from happening again or what to do to stop it if it comes back" wants discomfort to go away    Currently in Pain?  Yes    Pain Score  2     Pain Location  Neck    Pain Orientation  Right;Left    Pain Descriptors / Indicators  Aching    Pain Type  Acute pain    Pain Onset  More than a month ago    Pain Frequency  Intermittent       TREATMENT Manual therapy  STM performed to UT to improve  pain and spasms with patients positioned in supine to decreased pain and spasms. DN Dry Needling with patient positioned in sitting to decrease pain and spasms along the UTs B .71mm x 57mm needles with minimal pistoning secondary to patient comfort.      PT Education - 11/02/19 1738    Education Details  Education on the functionality of dry needling    Person(s) Educated  Patient    Methods  Explanation;Demonstration    Comprehension  Verbalized understanding;Returned demonstration       PT Short Term Goals - 09/07/19 2016      PT SHORT TERM GOAL #1   Title  Be independent with initial home exercise program for self-management of symptoms.     Baseline  initial HEP provieded at IE    Time  2    Period  Weeks    Status  New    Target Date  09/21/19        PT Long Term Goals - 09/07/19 2017      PT LONG TERM GOAL #1   Title  Be independent with a long-term home exercise program for self-management of symptoms.   ALL LONG TERM GOALS TARGET DATE: 11/30/2019   Baseline  Initial HEP provided at IE (09/07/2019);    Time  12    Period  Weeks    Status  New      PT LONG TERM GOAL #2   Title  Demonstrate improved FOTO score to equal or greater than 100 to demonstrate improvement in overall condition and self-reported functional ability.    Baseline  FOTO = 96 (09/07/2019);    Time  12    Period  Weeks    Status  New      PT LONG TERM GOAL #3   Title  Patient will demonstrate improved cervical spine AROM at least 5 degrees each direction with no increase in pain except mild end range discomfort in order to improve ability to check blind spot, read, and look overhead.    Baseline  see objective exam - limited and painful (09/07/2019);    Time  12    Period  Weeks    Status  New      PT LONG TERM GOAL #4   Title  Patient will report resolution of paresthesia in the the left UE for at least 2 weeks to improve pt's ability to complete work activities without difficulty.    Baseline  Intermittant left UE paresthesia throughout the day (09/07/2019);    Time  12    Period  Weeks      PT LONG TERM GOAL #5   Title  Complete community, work and/or recreational activities without limitation due to current condition.    Baseline  condition is irritating while doing all activities, especially prolonged looking down, reading, using phone, computer, work activities, using BUEs for reaching, phlebotomy, etc (09/07/2019);    Time  12    Period  Weeks            Plan - 11/02/19 1740    Clinical Impression Statement  Shortened session today secondary to family emergency. Focsued on improving tissue elasticity through manual therapy as  patient reports that has been assisting with her pain. improved tolerance to dryneedling today indicating functional carryover and less pain overall. Patient will benefit from further skilled therapy focused on improving limtiations to return to prior level of function.    Personal Factors and Comorbidities  Age;Comorbidity 3+;Past/Current Experience;Fitness;Education;Finances  Comorbidities  Relevant past medical history and comorbidities include prior low back pain (over 10 years ago), gastric bypass (2006), former smoker, HTN, vitamin D deficiency, shoulder pain, anemia, heart murmur.    Examination-Activity Limitations  Caring for Others;Reach Overhead;Lift;Hygiene/Grooming;Dressing    Examination-Participation Restrictions  Community Activity;Other;Interpersonal Relationship;Cleaning   condition is irritating while doing all activities, especially prolonged looking down, reading, using phone, computer, work activities, using BUEs for reaching, phlebotomy, etc.   Stability/Clinical Decision Making  Stable/Uncomplicated    Rehab Potential  Good    PT Frequency  2x / week   pt states she can only come 1 x per week due to financial limitations   PT Duration  12 weeks   up to 12 weeks as needed   PT Treatment/Interventions  ADLs/Self Care Home Management;Aquatic Therapy;Biofeedback;Cryotherapy;Electrical Stimulation;Moist Heat;Traction;Therapeutic activities;Therapeutic exercise;Neuromuscular re-education;Patient/family education;Manual techniques;Passive range of motion;Dry needling;Taping;Spinal Manipulations;Joint Manipulations    PT Next Visit Plan  review and update HEP as needed, manual therapy, nerve glides, progressive postural strengthening as tolerated.    PT Home Exercise Plan  Medbridge Access Code: [email or web address]    Consulted and Agree with Plan of Care  Patient       Patient will benefit from skilled therapeutic intervention in order to improve the following deficits and impairments:   Increased fascial restricitons, Pain, Improper body mechanics, Postural dysfunction, Increased muscle spasms, Decreased coordination, Decreased activity tolerance, Decreased endurance, Decreased range of motion, Decreased strength, Hypomobility, Impaired perceived functional ability, Impaired UE functional use, Impaired flexibility, Impaired sensation  Visit Diagnosis: Cervicalgia  Radiculopathy, cervical region     Problem List Patient Active Problem List   Diagnosis Date Noted  . Change in stool 08/12/2019  . Shoulder pain 08/12/2019  . Screening mammogram, encounter for 01/13/2018  . Depression, recurrent ([place]) 01/12/2017  . Rash and nonspecific skin eruption 09/17/2015  . Insomnia 08/06/2015  . Paresthesia 08/06/2015  . History of gastric bypass 08/06/2015  . HTN (hypertension) 08/02/2015  . Adiposity 08/02/2015  . Avitaminosis D 08/02/2015  . Routine physical examination 08/02/2015  . Back ache 04/16/2012    Blythe Stanford, PT DPT 11/02/2019, 5:42 PM  [place] [facility] [address] [place], [address] Phone: [phone]   Fax:  [phone]  Name: KYNLEA BLACKSTON MRN: [id number] Date of Birth: [birth year]

## 2019-11-07 ENCOUNTER — Ambulatory Visit: Payer: Managed Care, Other (non HMO)

## 2019-11-09 ENCOUNTER — Encounter: Payer: Managed Care, Other (non HMO) | Admitting: Physical Therapy

## 2019-11-11 ENCOUNTER — Telehealth (INDEPENDENT_AMBULATORY_CARE_PROVIDER_SITE_OTHER): Payer: Managed Care, Other (non HMO) | Admitting: Family

## 2019-11-11 ENCOUNTER — Telehealth: Payer: Self-pay | Admitting: Family

## 2019-11-11 VITALS — Ht 66.0 in | Wt 203.0 lb

## 2019-11-11 DIAGNOSIS — I10 Essential (primary) hypertension: Secondary | ICD-10-CM

## 2019-11-11 DIAGNOSIS — J309 Allergic rhinitis, unspecified: Secondary | ICD-10-CM

## 2019-11-11 DIAGNOSIS — D649 Anemia, unspecified: Secondary | ICD-10-CM | POA: Diagnosis not present

## 2019-11-11 MED ORDER — AMLODIPINE BESYLATE 5 MG PO TABS: 5.0000 mg | ORAL_TABLET | Freq: Every day | ORAL | 1 refills | Status: DC

## 2019-11-11 MED ORDER — PREDNISONE 10 MG PO TABS: ORAL_TABLET | ORAL | 0 refills | Status: DC

## 2019-11-11 NOTE — Assessment & Plan Note (Signed)
Slight.  Pending stool cards, further lab studies.  Patient due for Cologuard in August of this year

## 2019-11-11 NOTE — Telephone Encounter (Signed)
Lvm to schedule lab appt and then 3 month follow up

## 2019-11-11 NOTE — Progress Notes (Signed)
Pre visit review using our clinic review tool, if applicable. No additional management support is needed unless otherwise documented below in the visit note. 

## 2019-11-11 NOTE — Assessment & Plan Note (Signed)
Elevated however patient had forgotten to take 5 mg amlodipine since our last visit.  She will start that dose now and see me updated blood pressure readings

## 2019-11-11 NOTE — Progress Notes (Signed)
Virtual Visit via Video Note  I connected with@  on 11/11/19 at  8:00 AM EDT by a video enabled telemedicine application and verified that I am speaking with the correct person using two identifiers.  Location patient: home Location provider:work  Persons participating in the virtual visit: patient, provider  I discussed the limitations of evaluation and management by telemedicine and the availability of in person appointments. The patient expressed understanding and agreed to proceed.   HPI: Complains of post nasal drip, 3 days, unchanged.  More runny, dripping'. No thick nasal congestion.  Congestion worsened with mask.  Congestion is 'like it has always been.'  NO covid exposure. NO fever, loss of taste or smell, coughing, sob, ear pooping, facial pain.   Same sinus allergies as has every year.  H/o seasonal allergies   HTN- Hasnt been taking the amlodipine. at home , checked BP last week, 140/70.  No cp.   No change in stool pattern. No constipation.   abdominal cramping has resolved.  Anemia   ROS: See pertinent positives and negatives per HPI.  Past Medical History:  Diagnosis Date  . Heart murmur   . History of anemia   . Hypertension   . Postmenopausal bleeding   . Vitamin D deficiency   . Vitamin D deficiency     Past Surgical History:  Procedure Laterality Date  . GASTRIC BYPASS  2006    Family History  Problem Relation Age of Onset  . Hypertension Mother   . Hypertension Father   . Arthritis Maternal Grandmother   . Colon cancer Neg Hx        Current Outpatient Medications:  .  amLODipine (NORVASC) 5 MG tablet, Take 1 tablet (5 mg total) by mouth daily., Disp: 90 tablet, Rfl: 1 .  buPROPion (WELLBUTRIN XL) 300 MG 24 hr tablet, Take 1 tablet (300 mg total) by mouth every morning., Disp: 90 tablet, Rfl: 0 .  loratadine (CLARITIN) 10 MG tablet, Take 1 tablet (10 mg total) by mouth daily., Disp: 30 tablet, Rfl: 2 .  predniSONE (DELTASONE) 10 MG  tablet, Take 40 mg by mouth on day 1, then taper 10 mg daily until gone, Disp: 10 tablet, Rfl: 0  EXAM:  VITALS per patient if applicable: BP Readings from Last 3 Encounters:  09/07/19 (!) 152/70  08/12/19 (!) 152/80  08/08/19 126/85    GENERAL: alert, oriented, appears well and in no acute distress  HEENT: atraumatic, conjunttiva clear, no obvious abnormalities on inspection of external nose and ears  NECK: normal movements of the head and neck  LUNGS: on inspection no signs of respiratory distress, breathing rate appears normal, no obvious gross SOB, gasping or wheezing  CV: no obvious cyanosis  MS: moves all visible extremities without noticeable abnormality  PSYCH/NEURO: pleasant and cooperative, no obvious depression or anxiety, speech and thought processing grossly intact  ASSESSMENT AND PLAN:  Discussed the following assessment and plan:  Anemia, unspecified type - Plan: Fecal occult blood, imunochemical  Essential hypertension - Plan: amLODipine (NORVASC) 5 MG tablet  Allergic rhinitis, unspecified seasonality, unspecified trigger - Plan: predniSONE (DELTASONE) 10 MG tablet Problem List Items Addressed This Visit      Cardiovascular and Mediastinum   HTN (hypertension)    Elevated however patient had forgotten to take 5 mg amlodipine since our last visit.  She will start that dose now and see me updated blood pressure readings      Relevant Medications   amLODipine (NORVASC) 5 MG tablet  Respiratory   Allergic rhinitis    Duration 3 days, unchanged.  No cough, fever.  Patient feels very strongly this is similar to allergies she has had every year.  We jointly agreed we would not pursue Covid testing at this time . antihistamine is not effective.  We will go ahead and start short course of prednisone to see if helpful for postnasal drip.  Patient will stay vigilant let me know of any new or worsening symptoms which may warrant further evaluation or covid  testing.      Relevant Medications   predniSONE (DELTASONE) 10 MG tablet     Other   Anemia - Primary    Slight.  Pending stool cards, further lab studies.  Patient due for Cologuard in August of this year      Relevant Orders   Fecal occult blood, imunochemical      -we discussed possible serious and likely etiologies, options for evaluation and workup, limitations of telemedicine visit vs in person visit, treatment, treatment risks and precautions. Pt prefers to treat via telemedicine empirically rather then risking or undertaking an in person visit at this moment. Patient agrees to seek prompt in person care if worsening, new symptoms arise, or if is not improving with treatment.   I discussed the assessment and treatment plan with the patient. The patient was provided an opportunity to ask questions and all were answered. The patient agreed with the plan and demonstrated an understanding of the instructions.   The patient was advised to call back or seek an in-person evaluation if the symptoms worsen or if the condition fails to improve as anticipated.   Mable Paris, FNP

## 2019-11-11 NOTE — Assessment & Plan Note (Signed)
Duration 3 days, unchanged.  No cough, fever.  Patient feels very strongly this is similar to allergies she has had every year.  We jointly agreed we would not pursue Covid testing at this time . antihistamine is not effective.  We will go ahead and start short course of prednisone to see if helpful for postnasal drip.  Patient will stay vigilant let me know of any new or worsening symptoms which may warrant further evaluation or covid testing.

## 2019-11-11 NOTE — Patient Instructions (Signed)
Start prednisone for suspected allergic rhinitis  Let me know of any new or worsening symptoms  Ensure you are on the amlodipine 5mg .   It is imperative that you are seen AT least twice per year for labs and monitoring. Monitor blood pressure at home and me 5-6 reading on separate days. Goal is less than 120/80, based on newest guidelines, however we certainly want to be less than 130/80;  if persistently higher, please make sooner follow up appointment so we can recheck you blood pressure and manage/ adjust medications.

## 2019-11-16 ENCOUNTER — Ambulatory Visit: Payer: Managed Care, Other (non HMO) | Attending: Physician Assistant

## 2019-11-16 ENCOUNTER — Other Ambulatory Visit: Payer: Self-pay

## 2019-11-16 ENCOUNTER — Encounter: Payer: Managed Care, Other (non HMO) | Admitting: Physical Therapy

## 2019-11-16 DIAGNOSIS — M542 Cervicalgia: Secondary | ICD-10-CM | POA: Diagnosis present

## 2019-11-16 DIAGNOSIS — M5412 Radiculopathy, cervical region: Secondary | ICD-10-CM | POA: Diagnosis present

## 2019-11-16 NOTE — Therapy (Signed)
[facility] [address], [place], [address] Phone: [phone]   Fax:  [phone]  Physical Therapy Treatment  Patient Details  Name: Anna Mcgee MRN: [id number] Date of Birth: 03/31/1961 Referring Provider (PT): Jennet Maduro, PA-C    Encounter Date: 11/16/2019  PT End of Session - 11/16/19 1811    Visit Number  6    Number of Visits  24    Date for PT Re-Evaluation  11/30/19    Authorization Type  [employer] reporting period from 09/07/2019    Authorization Time Period  calendar year    Authorization - Visit Number  6    Authorization - Number of Visits  60    Progress Note Due on Visit  10    PT Start Time  1730    PT Stop Time  1815    PT Time Calculation (min)  45 min    Activity Tolerance  Patient tolerated treatment well    Behavior During Therapy  [facility] for tasks assessed/performed       Past Medical History:  Diagnosis Date  . Heart murmur   . History of anemia   . Hypertension   . Postmenopausal bleeding   . Vitamin D deficiency   . Vitamin D deficiency     Past Surgical History:  Procedure Laterality Date  . GASTRIC BYPASS  2006    There were no vitals filed for this visit.  Subjective Assessment - 11/16/19 1749    Subjective  Patient reports she has not been having as much pain as she had previously. Patient states she continues to get relief from dry needling.    Pertinent History  Patient is a 59 y.o. female who presents to outpatient physical therapy with a referral for medical diagnosis cervical radiculopathy. This patient's chief complaints consist of bilateral neck pain and intermittant R arm and lateral R trunk pain and intermittent left hand paresthesia leading to the following functional deficits: irritation with all activities including difficulty with prolonged looking down, working, using B UEs, prolonged activity. Relevant past medical history and comorbidities include prior  low back pain (over 10 years ago), gastric bypass (2006), former smoker, HTN, vitamin D deficiency, shoulder pain, anemia, heart murmur. Patient denies hx of spinal surgery, cancer, stroke, seizures, lung problem, major cardiac events, diabetes, unexplained weight loss, changes in bowel or bladder problems, new onset stumbling or dropping things apart from described below.    Limitations  Reading;Lifting;Writing;Other (comment)   Functional Limitations: condition is irritating while doing all activities, especially prolonged looking down, reading, using phone, computer, work activities, using BUEs for reaching, phlebotomy, etc.   Diagnostic tests  radiograph shows arthritis and maybe bulging disc per pt    Patient Stated Goals  "someone can tell me how to prevent this from happening again or what to do to stop it if it comes back" wants discomfort to go away    Currently in Pain?  No/denies    Pain Onset  More than a month ago          TREATMENT Therapeutic Exercise B Shoulder ER with BTB -- x 10 Scapular rows in standing 15# -- x 20  Scapular punches in standing with use of BTB --x 15 Straight arm push downs at OMEGA 20# -- x 20  Scapular push up PLUS at doorframe-- x 15 with therapist support Thoracic extension on standing with use of a towel -- x 10  Cervical extension  SNAG with towel - x 15 Cervical retraction into towel - x 15   Manual therapy  STM performed to UT to improve pain and spasms with patients positioned in supine to decreased pain and spasms.  DN Dry Needling with patient positioned in sitting to decrease pain and spasms along the UTs B .93mm x 36mm needles with minimal pistoning secondary to patient comfort.       PT Education - 11/16/19 1811    Education Details  form/technique with exercise    Person(s) Educated  Patient    Methods  Explanation;Demonstration    Comprehension  Verbalized understanding;Returned demonstration       PT Short Term Goals -  09/07/19 2016      PT SHORT TERM GOAL #1   Title  Be independent with initial home exercise program for self-management of symptoms.    Baseline  initial HEP provieded at IE    Time  2    Period  Weeks    Status  New    Target Date  09/21/19        PT Long Term Goals - 09/07/19 2017      PT LONG TERM GOAL #1   Title  Be independent with a long-term home exercise program for self-management of symptoms.   ALL LONG TERM GOALS TARGET DATE: 11/30/2019   Baseline  Initial HEP provided at IE (09/07/2019);    Time  12    Period  Weeks    Status  New      PT LONG TERM GOAL #2   Title  Demonstrate improved FOTO score to equal or greater than 100 to demonstrate improvement in overall condition and self-reported functional ability.    Baseline  FOTO = 96 (09/07/2019);    Time  12    Period  Weeks    Status  New      PT LONG TERM GOAL #3   Title  Patient will demonstrate improved cervical spine AROM at least 5 degrees each direction with no increase in pain except mild end range discomfort in order to improve ability to check blind spot, read, and look overhead.    Baseline  see objective exam - limited and painful (09/07/2019);    Time  12    Period  Weeks    Status  New      PT LONG TERM GOAL #4   Title  Patient will report resolution of paresthesia in the the left UE for at least 2 weeks to improve pt's ability to complete work activities without difficulty.    Baseline  Intermittant left UE paresthesia throughout the day (09/07/2019);    Time  12    Period  Weeks      PT LONG TERM GOAL #5   Title  Complete community, work and/or recreational activities without limitation due to current condition.    Baseline  condition is irritating while doing all activities, especially prolonged looking down, reading, using phone, computer, work activities, using BUEs for reaching, phlebotomy, etc (09/07/2019);    Time  12    Period  Weeks            Plan - 11/16/19 1812    Clinical  Impression Statement  Continued to focus on improving neck pain with use of dry needling and manual therapy. Performed greater amount of exercises at greater resistances compared to revious sessions. Patient demonstrates improvement overall with this requiring less cueing overall. However, continues to have increased UT activition with  movements. Patient will benefit from further skilled therapy focused on improving limitations to return to prior level of function.    Personal Factors and Comorbidities  Age;Comorbidity 3+;Past/Current Experience;Fitness;Education;Finances    Comorbidities  Relevant past medical history and comorbidities include prior low back pain (over 10 years ago), gastric bypass (2006), former smoker, HTN, vitamin D deficiency, shoulder pain, anemia, heart murmur.    Examination-Activity Limitations  Caring for Others;Reach Overhead;Lift;Hygiene/Grooming;Dressing    Examination-Participation Restrictions  Community Activity;Other;Interpersonal Relationship;Cleaning   condition is irritating while doing all activities, especially prolonged looking down, reading, using phone, computer, work activities, using BUEs for reaching, phlebotomy, etc.   Stability/Clinical Decision Making  Stable/Uncomplicated    Rehab Potential  Good    PT Frequency  2x / week   pt states she can only come 1 x per week due to financial limitations   PT Duration  12 weeks   up to 12 weeks as needed   PT Treatment/Interventions  ADLs/Self Care Home Management;Aquatic Therapy;Biofeedback;Cryotherapy;Electrical Stimulation;Moist Heat;Traction;Therapeutic activities;Therapeutic exercise;Neuromuscular re-education;Patient/family education;Manual techniques;Passive range of motion;Dry needling;Taping;Spinal Manipulations;Joint Manipulations    PT Next Visit Plan  review and update HEP as needed, manual therapy, nerve glides, progressive postural strengthening as tolerated.    PT Home Exercise Plan  Medbridge Access  Code: [email or web address]    Consulted and Agree with Plan of Care  Patient       Patient will benefit from skilled therapeutic intervention in order to improve the following deficits and impairments:  Increased fascial restricitons, Pain, Improper body mechanics, Postural dysfunction, Increased muscle spasms, Decreased coordination, Decreased activity tolerance, Decreased endurance, Decreased range of motion, Decreased strength, Hypomobility, Impaired perceived functional ability, Impaired UE functional use, Impaired flexibility, Impaired sensation  Visit Diagnosis: Cervicalgia  Radiculopathy, cervical region     Problem List Patient Active Problem List   Diagnosis Date Noted  . Allergic rhinitis 11/11/2019  . Anemia 08/12/2019  . Shoulder pain 08/12/2019  . Screening mammogram, encounter for 01/13/2018  . Depression, recurrent ([place]) 01/12/2017  . Rash and nonspecific skin eruption 09/17/2015  . Insomnia 08/06/2015  . Paresthesia 08/06/2015  . History of gastric bypass 08/06/2015  . HTN (hypertension) 08/02/2015  . Adiposity 08/02/2015  . Avitaminosis D 08/02/2015  . Routine physical examination 08/02/2015  . Back ache 04/16/2012    Blythe Stanford, PT DPT 11/16/2019, 6:17 PM  [facility] [address], [place], [address] Phone: [phone]   Fax:  [phone]  Name: EMMALI KAROW MRN: [id number] Date of Birth: 1961-02-07

## 2019-11-18 ENCOUNTER — Telehealth: Payer: Self-pay | Admitting: Family

## 2019-11-18 DIAGNOSIS — D649 Anemia, unspecified: Secondary | ICD-10-CM

## 2019-11-18 NOTE — Progress Notes (Signed)
LMTCB

## 2019-11-18 NOTE — Telephone Encounter (Signed)
Pt was returning call 

## 2019-11-21 ENCOUNTER — Ambulatory Visit: Payer: Managed Care, Other (non HMO)

## 2019-11-22 NOTE — Telephone Encounter (Signed)
Looks like patient was called on 11/11/19 to schedule lab appt and 3 month follow up. Spoke with patient this morning and she needs the labs to go to [facility] and also that she would call back to schedule follow up appt when she has her schedule.

## 2019-11-23 ENCOUNTER — Ambulatory Visit: Payer: Managed Care, Other (non HMO)

## 2019-11-23 NOTE — Telephone Encounter (Signed)
I have ordered B12, folate, iron stores to [facility].  Please ensure patient has this done. Please also ensure that she is picked up stool cards ( ordered on 11/11/19)  and understands to return them ASAP.

## 2019-11-28 ENCOUNTER — Ambulatory Visit: Payer: Managed Care, Other (non HMO)

## 2019-11-30 ENCOUNTER — Ambulatory Visit: Payer: Managed Care, Other (non HMO)

## 2019-12-05 ENCOUNTER — Ambulatory Visit: Payer: Managed Care, Other (non HMO)

## 2019-12-05 ENCOUNTER — Other Ambulatory Visit: Payer: Self-pay

## 2019-12-05 DIAGNOSIS — M542 Cervicalgia: Secondary | ICD-10-CM

## 2019-12-06 NOTE — Therapy (Signed)
[facility] [address] [place], [address] Phone: [phone]   Fax:  [phone]  Physical Therapy Treatment  Patient Details  Name: Anna Mcgee MRN: [id number] Date of Birth: 28-May-1961 Referring Provider (PT): Tessa Lerner, PA-C    Encounter Date: 12/05/2019   PT End of Session - 12/05/19 1910    Visit Number 7    Number of Visits 24    Date for PT Re-Evaluation 11/30/19    Authorization Type [employer] reporting period from 09/07/2019    Authorization Time Period calendar year    Authorization - Visit Number 7    Authorization - Number of Visits 60    Progress Note Due on Visit 10    PT Start Time 1845    PT Stop Time 1915    PT Time Calculation (min) 30 min    Activity Tolerance Patient tolerated treatment well    Behavior During Therapy [facility] for tasks assessed/performed           Past Medical History:  Diagnosis Date  . Heart murmur   . History of anemia   . Hypertension   . Postmenopausal bleeding   . Vitamin D deficiency   . Vitamin D deficiency     Past Surgical History:  Procedure Laterality Date  . GASTRIC BYPASS  2006    There were no vitals filed for this visit.   Subjective Assessment - 12/05/19 1851    Subjective Patient reports R UT pain starting 2 days ago. Patient states her pain increased after doing some light yardwork.    Pertinent History Patient is a 59 y.o. female who presents to outpatient physical therapy with a referral for medical diagnosis cervical radiculopathy. This patient's chief complaints consist of bilateral neck pain and intermittant R arm and lateral R trunk pain and intermittent left hand paresthesia leading to the following functional deficits: irritation with all activities including difficulty with prolonged looking down, working, using B UEs, prolonged activity. Relevant past medical history and comorbidities include prior low back pain (over 10 years  ago), gastric bypass (2006), former smoker, HTN, vitamin D deficiency, shoulder pain, anemia, heart murmur. Patient denies hx of spinal surgery, cancer, stroke, seizures, lung problem, major cardiac events, diabetes, unexplained weight loss, changes in bowel or bladder problems, new onset stumbling or dropping things apart from described below.    Limitations Reading;Lifting;Writing;Other (comment)   Functional Limitations: condition is irritating while doing all activities, especially prolonged looking down, reading, using phone, computer, work activities, using BUEs for reaching, phlebotomy, etc.   Diagnostic tests radiograph shows arthritis and maybe bulging disc per pt    Patient Stated Goals "someone can tell me how to prevent this from happening again or what to do to stop it if it comes back" wants discomfort to go away    Currently in Pain? Yes    Pain Score 3     Pain Location Neck    Pain Orientation Right    Pain Descriptors / Indicators Aching    Pain Type Acute pain    Pain Onset More than a month ago    Pain Frequency Intermittent                 TREATMENT Therapeutic Exercise B scapular retraction behind the back - x 20 Scapular punches in standing - x20 B Shoulder ER - x20   Manual therapy  STM performed to UT to improve pain and spasms with patients  positioned in supine to decreased pain and spasms.  DN Dry Needling with patient positioned in sitting to decrease pain and spasms along the UT on the R side.81mm x 24mm needles with minimal pistoning secondary to patient comfort.    PT Education - 12/05/19 1902    Education Details form/technique with exercise    Person(s) Educated Patient    Methods Explanation;Demonstration    Comprehension Verbalized understanding;Returned demonstration            PT Short Term Goals - 09/07/19 2016      PT SHORT TERM GOAL #1   Title Be independent with initial home exercise program for self-management of symptoms.     Baseline initial HEP provieded at IE    Time 2    Period Weeks    Status New    Target Date 09/21/19             PT Long Term Goals - 12/06/19 0756      PT LONG TERM GOAL #1   Title Be independent with a long-term home exercise program for self-management of symptoms.   ALL LONG TERM GOALS TARGET DATE: 11/30/2019   Baseline Initial HEP provided at IE (09/07/2019); 12/05/2019: moderate cueing required for exercise performance    Time 12    Period Weeks    Status On-going      PT LONG TERM GOAL #2   Title Demonstrate improved FOTO score to equal or greater than 100 to demonstrate improvement in overall condition and self-reported functional ability.    Baseline FOTO = 96 (09/07/2019); 12/06/2019: 88    Time 12    Period Weeks    Status On-going      PT LONG TERM GOAL #3   Title Patient will demonstrate improved cervical spine AROM at least 5 degrees each direction with no increase in pain except mild end range discomfort in order to improve ability to check blind spot, read, and look overhead.    Baseline see objective exam - limited and painful (09/07/2019); 12/05/2019: WNL no increase in pain    Time 12    Period Weeks    Status On-going      PT LONG TERM GOAL #4   Title Patient will report resolution of paresthesia in the the left UE for at least 2 weeks to improve pt's ability to complete work activities without difficulty.    Baseline Intermittant left UE paresthesia throughout the day (09/07/2019); 12/06/2019: No parathesis    Time 12    Period Weeks    Status On-going      PT LONG TERM GOAL #5   Title Complete community, work and/or recreational activities without limitation due to current condition.    Baseline condition is irritating while doing all activities, especially prolonged looking down, reading, using phone, computer, work activities, using BUEs for reaching, phlebotomy, etc (09/07/2019); 12/05/2019: Only increased pain with digging    Time 12    Period Weeks     Status On-going                 Plan - 12/05/19 1912    Clinical Impression Statement Patient is improving towards    Personal Factors and Comorbidities Age;Comorbidity 3+;Past/Current Experience;Fitness;Education;Finances    Comorbidities Relevant past medical history and comorbidities include prior low back pain (over 10 years ago), gastric bypass (2006), former smoker, HTN, vitamin D deficiency, shoulder pain, anemia, heart murmur.    Examination-Activity Limitations Caring for Others;Reach Overhead;Lift;Hygiene/Grooming;Dressing    Examination-Participation  Restrictions Community Activity;Other;Interpersonal Relationship;Cleaning   condition is irritating while doing all activities, especially prolonged looking down, reading, using phone, computer, work activities, using BUEs for reaching, phlebotomy, etc.   Stability/Clinical Decision Making Stable/Uncomplicated    Rehab Potential Good    PT Frequency 2x / week   pt states she can only come 1 x per week due to financial limitations   PT Duration 12 weeks   up to 12 weeks as needed   PT Treatment/Interventions ADLs/Self Care Home Management;Aquatic Therapy;Biofeedback;Cryotherapy;Electrical Stimulation;Moist Heat;Traction;Therapeutic activities;Therapeutic exercise;Neuromuscular re-education;Patient/family education;Manual techniques;Passive range of motion;Dry needling;Taping;Spinal Manipulations;Joint Manipulations    PT Next Visit Plan review and update HEP as needed, manual therapy, nerve glides, progressive postural strengthening as tolerated.    PT Home Exercise Plan Medbridge Access Code: [email or web address]    Consulted and Agree with Plan of Care Patient           Patient will benefit from skilled therapeutic intervention in order to improve the following deficits and impairments:  Increased fascial restricitons, Pain, Improper body mechanics, Postural dysfunction, Increased muscle spasms, Decreased coordination, Decreased activity  tolerance, Decreased endurance, Decreased range of motion, Decreased strength, Hypomobility, Impaired perceived functional ability, Impaired UE functional use, Impaired flexibility, Impaired sensation  Visit Diagnosis: Cervicalgia     Problem List Patient Active Problem List   Diagnosis Date Noted  . Allergic rhinitis 11/11/2019  . Anemia 08/12/2019  . Shoulder pain 08/12/2019  . Screening mammogram, encounter for 01/13/2018  . Depression, recurrent ([facility]) 01/12/2017  . Rash and nonspecific skin eruption 09/17/2015  . Insomnia 08/06/2015  . Paresthesia 08/06/2015  . History of gastric bypass 08/06/2015  . HTN (hypertension) 08/02/2015  . Adiposity 08/02/2015  . Avitaminosis D 08/02/2015  . Routine physical examination 08/02/2015  . Back ache 04/16/2012    Myrene Galas, PT DPT  12/06/2019, 8:18 AM  [facility] [address] [place], [address] Phone: [phone]   Fax:  [phone]  Name: Anna Mcgee MRN: [id number] Date of Birth: May 27, 1961

## 2019-12-06 NOTE — Addendum Note (Signed)
Addended by: Bethanie Dicker on: 12/06/2019 08:30 AM   Modules accepted: Orders

## 2019-12-07 ENCOUNTER — Ambulatory Visit: Payer: Managed Care, Other (non HMO)

## 2019-12-08 NOTE — Telephone Encounter (Signed)
Called and spoke with the Patient.  She has not had these labs done but states someone will be by to help her soon and can have these drawn.   Patient declines doing stool cards as she states she is no longer having the dark stools. States she was eating a lot of spinach and once she stopped the dark stools went away.

## 2019-12-09 ENCOUNTER — Encounter: Payer: Self-pay | Admitting: Nurse Practitioner

## 2019-12-09 ENCOUNTER — Other Ambulatory Visit: Payer: Self-pay

## 2019-12-09 ENCOUNTER — Telehealth (INDEPENDENT_AMBULATORY_CARE_PROVIDER_SITE_OTHER): Payer: Managed Care, Other (non HMO) | Admitting: Nurse Practitioner

## 2019-12-09 VITALS — Ht 66.0 in | Wt 203.0 lb

## 2019-12-09 DIAGNOSIS — R21 Rash and other nonspecific skin eruption: Secondary | ICD-10-CM | POA: Diagnosis not present

## 2019-12-09 MED ORDER — CETIRIZINE HCL 10 MG PO TABS: 10.0000 mg | ORAL_TABLET | Freq: Every day | ORAL | 3 refills | Status: DC

## 2019-12-09 MED ORDER — TRIAMCINOLONE ACETONIDE 0.1 % EX CREA: 1.0000 "application " | TOPICAL_CREAM | Freq: Two times a day (BID) | CUTANEOUS | 0 refills | Status: DC

## 2019-12-09 NOTE — Telephone Encounter (Signed)
noted 

## 2019-12-09 NOTE — Progress Notes (Signed)
Virtual Visit via Video Note  This visit type was conducted due to national recommendations for restrictions regarding the COVID-19 pandemic (e.g. social distancing).  This format is felt to be most appropriate for this patient at this time.  All issues noted in this document were discussed and addressed.  No physical exam was performed (except for noted visual exam findings with Video Visits).   I connected with@ on 12/11/19 at  4:30 PM EDT by a video enabled telemedicine application or telephone and verified that I am speaking with the correct person using two identifiers. Location patient: home Location provider: work or home office Persons participating in the virtual visit: patient, provider  I discussed the limitations, risks, security and privacy concerns of performing an evaluation and management service by telephone and the availability of in person appointments. I also discussed with the patient that there may be a patient responsible charge related to this service. The patient expressed understanding and agreed to proceed.  Reason for visit: Return of rash- maybe eczema.  HPI: Patient is a 59 year old who reports that she has had problems with dry itchy skin on and off for years.  She can recall seeing [facility] on [address] once - many years ago. She uses hydrocortisone cream that she picks up over-the-counter.  She has a itchy spot on her back and her bra rubs it.  She has a couple of scaly dry spots on her face  once in a while that come and go.  She has had a spot on her neck, chest, ankles in the past.  Currently, she has  dry patches of skin and very itchy on the sides of her bilateral  thumb now. She wears gloves at work and has done so for years- nothing new. She has started back on her Claritin which he has used on and off through the years to help with this. She does not get welts, red raised area, blisters. She has had no contact with greenery outdoors. No new pets. No  pulmonary complaints.  No fevers or chills, unusual weight loss, upper respiratory symptoms.  She did not get the Covid vaccine.  She has had no allergy testing.  She has had no recent dermatology evaluation.  ROS: See pertinent positives and negatives per HPI.  Past Medical History:  Diagnosis Date  . Heart murmur   . History of anemia   . Hypertension   . Postmenopausal bleeding   . Vitamin D deficiency   . Vitamin D deficiency     Past Surgical History:  Procedure Laterality Date  . GASTRIC BYPASS  2006    Family History  Problem Relation Age of Onset  . Hypertension Mother   . Hypertension Father   . Arthritis Maternal Grandmother   . Colon cancer Neg Hx      Current Outpatient Medications:  .  amLODipine (NORVASC) 5 MG tablet, Take 1 tablet (5 mg total) by mouth daily., Disp: 90 tablet, Rfl: 1 .  buPROPion (WELLBUTRIN XL) 300 MG 24 hr tablet, Take 1 tablet (300 mg total) by mouth every morning., Disp: 90 tablet, Rfl: 0 .  cetirizine (ZYRTEC) 10 MG tablet, Take 1 tablet (10 mg total) by mouth daily., Disp: 30 tablet, Rfl: 3 .  loratadine (CLARITIN) 10 MG tablet, Take 1 tablet (10 mg total) by mouth daily. (Patient not taking: Reported on 12/09/2019), Disp: 30 tablet, Rfl: 2 .  triamcinolone cream (KENALOG) 0.1 %, Apply 1 application topically 2 (two) times daily., Disp:  30 g, Rfl: 0  EXAM:  VITALS per patient if applicable:  GENERAL: alert, oriented, appears well and in no acute distress  HEENT: atraumatic, conjunctiva clear, no obvious abnormalities on inspection of external nose and ears  NECK: normal movements of the head and neck  LUNGS: on inspection no signs of respiratory distress, breathing rate appears normal, no obvious gross SOB, gasping or wheezing  CV: no obvious cyanosis  MS: moves all visible extremities without noticeable abnormality  PSYCH/NEURO: pleasant and cooperative, no obvious depression or anxiety, speech and thought processing grossly  intact  DERM: Unfortunately, the video feed is poor and I cannot get a good look at her skin.  She took it to  different windows, and the quality is poor. No erythema, papules or blisters seen.   ASSESSMENT AND PLAN:  Discussed the following assessment and plan:  Rash and nonspecific skin eruption - Plan: Ambulatory referral to Dermatology  No problem-specific Assessment & Plan notes found for this encounter.  By her description, I suspect the type of eczema rash.  I gave her after visit summary on eczema.  There is no component of a poison ivy or oak.  Will give her triamcinolone 0.1% cream to use twice daily.  Dermatology appointment. Patient was advised:   Use mild skin cleansers only.  Use Dove, Cetaphil. Daily use skin moisturizers is recommended.  Vaseline can even be helpful. Avoidance of excessive and aggressive skin washing.  This can dry the skin especially when hot water is used.  Lukewarm water is preferable for bathing and avoid aggressive scrubbing.    I have ordered Zyrtec for your antihistamine in place of Claritin or Allegra.  I have ordered triamcinolone cream which is more potent steroid cream.  Just use a tiny bit sparingly over your most itchy irritated areas.  For the rest use moisturizer.  Avoid the face.   I gave you a referral with [place] [facility] in place of [facility] dermatology as they did not pop up in your  network.  If you want to call [facility]  up on your own and give them your insurance card and see if they can see you that would be fine.  It really does not matter to me which dermatologist you see.  I discussed the assessment and treatment plan with the patient. The patient was provided an opportunity to ask questions and all were answered. The patient agreed with the plan and demonstrated an understanding of the instructions.   The patient was advised to call back or seek an in-person evaluation if the symptoms worsen or if the condition fails to improve as  anticipated.   Amedeo Kinsman, NP Adult Nurse Practitioner [facility] [employer] [phone]

## 2019-12-09 NOTE — Patient Instructions (Addendum)
Use mild skin cleansers only.  Use Dove, Cetaphil. Daily use skin moisturizers is recommended.  Vaseline can even be helpful. Avoidance of excessive and aggressive skin washing.  This can dry the skin especially when hot water is used.  Lukewarm water is preferable for bathing and avoid aggressive scrubbing.    I have ordered Zyrtec for your antihistamine in place of Claritin or Allegra.  I have ordered triamcinolone cream which is more potent steroid cream.  Just use a tiny bit sparingly over your most itchy irritated areas.  For the rest use moisturizer.  Avoid the face.   I gave you a referral with [place] [facility] in place of [facility] dermatology as they did not pop up in your  network.  If you want to call [facility]  up on your own and give them your insurance card and see if they can see you that would be fine.  It really does not matter to me which dermatologist you see.   I do not know if you have eczema or other itchy dry skin diagnosis- but the instructions below should work for you now.   Eczema Eczema is a broad term for a group of skin conditions that cause skin to become rough and inflamed. Each type of eczema has different triggers, symptoms, and treatments. Eczema of any type is usually itchy and symptoms range from mild to severe. Eczema and its symptoms are not spread from person to person (are not contagious). It can appear on different parts of the body at different times. Your eczema may not look the same as someone else's eczema. What are the types of eczema? Atopic dermatitis This is a long-term (chronic) skin disease that keeps coming back (recurring). Usual symptoms are dry skin and small, solid pimples that may swell and leak fluid (weep). Contact dermatitis  This happens when something irritates the skin and causes a rash. The irritation can come from substances that you are allergic to (allergens), such as poison ivy, chemicals, or medicines that were applied to your  skin. Dyshidrotic eczema This is a form of eczema on the hands and feet. It shows up as very itchy, fluid-filled blisters. It can affect people of any age, but is more common before age 69. Hand eczema  This causes very itchy areas of skin on the palms and sides of the hands and fingers. This type of eczema is common in industrial jobs where you may be exposed to many different types of irritants. Lichen simplex chronicus This type of eczema occurs when a person constantly scratches one area of the body. Repeated scratching of the area leads to thickened skin (lichenification). Lichen simplex chronicus can occur along with other types of eczema. It is more common in adults, but may be seen in children as well. Nummular eczema This is a common type of eczema. It has no known cause. It typically causes a red, circular, crusty lesion (plaque) that may be itchy. Scratching may become a habit and can cause bleeding. Nummular eczema occurs most often in people of middle-age or older. It most often affects the hands. Seborrheic dermatitis This is a common skin disease that mainly affects the scalp. It may also affect any oily areas of the body, such as the face, sides of nose, eyebrows, ears, eyelids, and chest. It is marked by small scaling and redness of the skin (erythema). This can affect people of all ages. In infants, this condition is known as Location manager." Stasis dermatitis This  is a common skin disease that usually appears on the legs and feet. It most often occurs in people who have a condition that prevents blood from being pumped through the veins in the legs (chronic venous insufficiency). Stasis dermatitis is a chronic condition that needs long-term management. How is eczema diagnosed? Your health care provider will examine your skin and review your medical history. He or she may also give you skin patch tests. These tests involve taking patches that contain possible allergens and placing them  on your back. He or she will then check in a few days to see if an allergic reaction occurred. What are the common treatments? Treatment for eczema is based on the type of eczema you have. Hydrocortisone steroid medicine can relieve itching quickly and help reduce inflammation. This medicine may be prescribed or obtained over-the-counter, depending on the strength of the medicine that is needed. Follow these instructions at home:  Take over-the-counter and prescription medicines only as told by your health care provider.  Use creams or ointments to moisturize your skin. Do not use lotions.  Learn what triggers or irritates your symptoms. Avoid these things.  Treat symptom flare-ups quickly.  Do not itch your skin. This can make your rash worse.  Keep all follow-up visits as told by your health care provider. This is important. Where to find more information  The American Academy of Dermatology: http://jones-macias.info/  The National Eczema Association: www.nationaleczema.org Contact a health care provider if:  You have serious itching, even with treatment.  You regularly scratch your skin until it bleeds.  Your rash looks different than usual.  Your skin is painful, swollen, or more red than usual.  You have a fever. Summary  There are eight general types of eczema. Each type has different triggers.  Eczema of any type causes itching that may range from mild to severe.  Treatment varies based on the type of eczema you have. Hydrocortisone steroid medicine can help with itching and inflammation.  Protecting your skin is the best way to prevent eczema. Use moisturizers and lotions. Avoid triggers and irritants, and treat flare-ups quickly. This information is not intended to replace advice given to you by your health care provider. Make sure you discuss any questions you have with your health care provider. Document Revised: 05/15/2017 Document Reviewed: 10/16/2016 Elsevier Patient  Education  [place].

## 2019-12-10 LAB — IRON,TIBC AND FERRITIN PANEL
Ferritin: 10 ng/mL — ABNORMAL LOW (ref 15–150)
Iron Saturation: 6 % — CL (ref 15–55)
Iron: 31 ug/dL (ref 27–159)
Total Iron Binding Capacity: 523 ug/dL — ABNORMAL HIGH (ref 250–450)
UIBC: 492 ug/dL — ABNORMAL HIGH (ref 131–425)

## 2019-12-10 LAB — B12 AND FOLATE PANEL
Folate: >20 ng/mL (ref >3.0)
Vitamin B-12: 1096 pg/mL (ref 232–1245)

## 2019-12-11 ENCOUNTER — Encounter: Payer: Self-pay | Admitting: Nurse Practitioner

## 2019-12-12 ENCOUNTER — Ambulatory Visit: Payer: Managed Care, Other (non HMO)

## 2019-12-12 ENCOUNTER — Encounter: Payer: Self-pay | Admitting: Family

## 2019-12-12 ENCOUNTER — Other Ambulatory Visit: Payer: Self-pay | Admitting: Family

## 2019-12-12 DIAGNOSIS — D509 Iron deficiency anemia, unspecified: Secondary | ICD-10-CM

## 2019-12-14 ENCOUNTER — Other Ambulatory Visit: Payer: Self-pay

## 2019-12-14 ENCOUNTER — Inpatient Hospital Stay: Payer: Managed Care, Other (non HMO)

## 2019-12-14 ENCOUNTER — Encounter: Payer: Self-pay | Admitting: Internal Medicine

## 2019-12-14 ENCOUNTER — Inpatient Hospital Stay: Payer: Managed Care, Other (non HMO) | Attending: Oncology | Admitting: Internal Medicine

## 2019-12-14 DIAGNOSIS — Z8249 Family history of ischemic heart disease and other diseases of the circulatory system: Secondary | ICD-10-CM

## 2019-12-14 DIAGNOSIS — I10 Essential (primary) hypertension: Secondary | ICD-10-CM | POA: Diagnosis not present

## 2019-12-14 DIAGNOSIS — M791 Myalgia, unspecified site: Secondary | ICD-10-CM

## 2019-12-14 DIAGNOSIS — E611 Iron deficiency: Secondary | ICD-10-CM | POA: Diagnosis not present

## 2019-12-14 DIAGNOSIS — Z8261 Family history of arthritis: Secondary | ICD-10-CM | POA: Diagnosis not present

## 2019-12-14 DIAGNOSIS — D508 Other iron deficiency anemias: Secondary | ICD-10-CM | POA: Diagnosis not present

## 2019-12-14 DIAGNOSIS — Z87891 Personal history of nicotine dependence: Secondary | ICD-10-CM | POA: Diagnosis not present

## 2019-12-14 DIAGNOSIS — Z79899 Other long term (current) drug therapy: Secondary | ICD-10-CM | POA: Diagnosis not present

## 2019-12-14 DIAGNOSIS — K909 Intestinal malabsorption, unspecified: Secondary | ICD-10-CM

## 2019-12-14 DIAGNOSIS — E559 Vitamin D deficiency, unspecified: Secondary | ICD-10-CM

## 2019-12-14 DIAGNOSIS — Z9884 Bariatric surgery status: Secondary | ICD-10-CM

## 2019-12-14 NOTE — Progress Notes (Signed)
Cancer Center CONSULT NOTE  Patient Care Team: Allegra Grana, FNP as PCP - General (Family Medicine)  CHIEF COMPLAINTS/PURPOSE OF CONSULTATION:    HEMATOLOGY HISTORY;  # June 2021-fatigue [ferritin 10; iron saturation 6%; February 2021-hemoglobin 11; PCP];  EGD/colonoscopy-NONE [cologard- NEG]  # GASTRIC BYPASS [2006]; history of smoking   HISTORY OF PRESENTING ILLNESS:  Anna Mcgee 59 y.o.  female has been referred to [place] for further evaluation/work-up for anemia.  Patient complains of extreme fatigue.  Complains of myalgias.  Otherwise no blood in stools black or stools or any vaginal bleeding.  No blood in urine.  Blood in stools: None Change in bowel habits- None Blood in urine: None Difficulty swallowing: None Abnormal weight loss: None Iron supplementation: None Prior Blood transfusions: NO Vaginal bleeding: None; Menopause at 48 years;    Review of Systems  Constitutional: Positive for malaise/fatigue. Negative for chills, diaphoresis, fever and weight loss.  HENT: Negative for nosebleeds and sore throat.   Eyes: Negative for double vision.  Respiratory: Negative for cough, hemoptysis, sputum production, shortness of breath and wheezing.   Cardiovascular: Negative for chest pain, palpitations, orthopnea and leg swelling.  Gastrointestinal: Negative for abdominal pain, blood in stool, constipation, diarrhea, heartburn, melena, nausea and vomiting.  Genitourinary: Negative for dysuria, frequency and urgency.  Musculoskeletal: Positive for back pain and joint pain.  Skin: Negative.  Negative for itching and rash.  Neurological: Negative for dizziness, tingling, focal weakness, weakness and headaches.  Endo/Heme/Allergies: Does not bruise/bleed easily.  Psychiatric/Behavioral: Negative for depression. The patient is not nervous/anxious and does not have insomnia.     MEDICAL HISTORY:  Past Medical History:  Diagnosis Date  . Heart murmur   .  History of anemia   . Hypertension   . Postmenopausal bleeding   . Vitamin D deficiency   . Vitamin D deficiency     SURGICAL HISTORY: Past Surgical History:  Procedure Laterality Date  . GASTRIC BYPASS  2006    SOCIAL HISTORY: Social History   Socioeconomic History  . Marital status: Single    Spouse name: Not on file  . Number of children: Not on file  . Years of education: Not on file  . Highest education level: Not on file  Occupational History  . Not on file  Tobacco Use  . Smoking status: Former Smoker    Types: Cigarettes    Quit date: 08/01/2010    Years since quitting: 9.3  . Smokeless tobacco: Never Used  . Tobacco comment: Vapes  Vaping Use  . Vaping Use: Some days  Substance and Sexual Activity  . Alcohol use: Yes    Alcohol/week: 0.0 standard drinks    Comment: Rare  . Drug use: No  . Sexual activity: Not Currently  Other Topics Concern  . Not on file  Social History Narrative   Single ; Employed at [employer] ; Children- 0 ; Caffeine- coffee 3 cups, diet soda 1, unsweet tea       Lives in . Quit smoking- ~10 years; no alcohol. vapes- nicotine.    Social Determinants of Health   Financial Resource Strain:   . Difficulty of Paying Living Expenses:   Food Insecurity:   . Worried About Programme researcher, broadcasting/film/video in the Last Year:   . Barista in the Last Year:   Transportation Needs:   . Freight forwarder (Medical):   Marland Kitchen Lack of Transportation (Non-Medical):   Physical Activity:   . Days  of Exercise per Week:   . Minutes of Exercise per Session:   Stress:   . Feeling of Stress :   Social Connections:   . Frequency of Communication with Friends and Family:   . Frequency of Social Gatherings with Friends and Family:   . Attends Religious Services:   . Active Member of Clubs or Organizations:   . Attends Banker Meetings:   Marland Kitchen Marital Status:   Intimate Partner Violence:   . Fear of Current or Ex-Partner:    . Emotionally Abused:   Marland Kitchen Physically Abused:   . Sexually Abused:     FAMILY HISTORY: Family History  Problem Relation Age of Onset  . Hypertension Mother   . Hypertension Father   . Arthritis Maternal Grandmother   . Colon cancer Neg Hx     ALLERGIES:  is allergic to penicillins.  MEDICATIONS:  Current Outpatient Medications  Medication Sig Dispense Refill  . amLODipine (NORVASC) 5 MG tablet Take 1 tablet (5 mg total) by mouth daily. 90 tablet 1  . buPROPion (WELLBUTRIN XL) 300 MG 24 hr tablet Take 1 tablet (300 mg total) by mouth every morning. 90 tablet 0  . loratadine (CLARITIN) 10 MG tablet Take 1 tablet (10 mg total) by mouth daily. 30 tablet 2  . triamcinolone cream (KENALOG) 0.1 % Apply 1 application topically 2 (two) times daily. 30 g 0  . cetirizine (ZYRTEC) 10 MG tablet Take 1 tablet (10 mg total) by mouth daily. (Patient not taking: Reported on 12/14/2019) 30 tablet 3   No current facility-administered medications for this visit.      PHYSICAL EXAMINATION:   Vitals:   12/14/19 1127  BP: (!) 168/94  Pulse: 80  Resp: 16  Temp: (!) 97.5 F (36.4 C)  SpO2: 100%   Filed Weights   12/14/19 1127  Weight: 204 lb 6.4 oz (92.7 kg)    Physical Exam HENT:     Head: Normocephalic and atraumatic.     Mouth/Throat:     Pharynx: No oropharyngeal exudate.  Eyes:     Pupils: Pupils are equal, round, and reactive to light.  Cardiovascular:     Rate and Rhythm: Normal rate and regular rhythm.  Pulmonary:     Effort: No respiratory distress.     Breath sounds: No wheezing.  Abdominal:     General: Bowel sounds are normal. There is no distension.     Palpations: Abdomen is soft. There is no mass.     Tenderness: There is no abdominal tenderness. There is no guarding or rebound.  Musculoskeletal:        General: No tenderness. Normal range of motion.     Cervical back: Normal range of motion and neck supple.  Skin:    General: Skin is warm.  Neurological:      Mental Status: She is alert and oriented to person, place, and time.  Psychiatric:        Mood and Affect: Affect normal.     LABORATORY DATA:  I have reviewed the data as listed Lab Results  Component Value Date   WBC 4.7 08/08/2019   HGB 11.0 (L) 08/08/2019   HCT 35.0 (L) 08/08/2019   MCV 82.5 08/08/2019   PLT 291 08/08/2019   Recent Labs    04/21/19 1425 08/08/19 2110  NA 139 140  K 4.2 3.7  CL 104 105  CO2 22 26  GLUCOSE 77 112*  BUN 16 18  CREATININE 0.67 1.00  CALCIUM  9.3 9.3  GFRNONAA 97 >60  GFRAA 111 >60  PROT 7.2 7.3  ALBUMIN 4.4 4.1  AST 20 26  ALT 13 18  ALKPHOS 86 66  BILITOT <0.2 0.6     No results found.  Iron deficiency # Iron def anemia-June 2021 iron sat -10%; ferritin-6; hemoglobin February 2021-11.  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  However would recommend screening colonoscopy.   #Given myalgias-history of low vitamin D; recommend vitamin D levels.  Awaiting vitamin D levels-for dosing of vitamin D.  #History of smoking/ Discussed regarding lung cancer screening program at length; which includes low-dose CT scan on annual basis for 5 years.  Based upon the findings patient would be recommended surveillance.  Lung cancer program has shown to save lives by early detection of lung cancer.  It is in the discussion patient was quite tearful; emotional.  Unable to make a decision.  Will discuss the patient again at next visit.  Thank you, Ms.Arnett for allowing me to participate in the care of your pleasant patient. Please do not hesitate to contact me with questions or concerns in the interim.  # DISPOSITION: # [facility]; cmp; LDH; vit D  # venofer weekly x4; # follow up in 8 weeks- MD;[facility] prior- possible venofer-Dr.B  All questions were answered. The patient knows to call the clinic with any problems, questions or concerns.   Earna Coder, MD 12/14/2019  2:08 PM

## 2019-12-14 NOTE — Assessment & Plan Note (Addendum)
#   Iron def anemia-June 2021 iron sat -10%; ferritin-6; hemoglobin February 2021-11.  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  However would recommend screening colonoscopy.   #Given myalgias-history of low vitamin D; recommend vitamin D levels.  Awaiting vitamin D levels-for dosing of vitamin D.  #History of smoking/ Discussed regarding lung cancer screening program at length; which includes low-dose CT scan on annual basis for 5 years.  Based upon the findings patient would be recommended surveillance.  Lung cancer program has shown to save lives by early detection of lung cancer.  It is in the discussion patient was quite tearful; emotional.  Unable to make a decision.  Will discuss the patient again at next visit.  Thank you, Ms.Arnett for allowing me to participate in the care of your pleasant patient. Please do not hesitate to contact me with questions or concerns in the interim.  # DISPOSITION: # [facility]; cmp; LDH; vit D  # venofer weekly x4; # follow up in 8 weeks- MD;[facility]-lab prior- possible venofer-Dr.B

## 2019-12-15 ENCOUNTER — Telehealth: Payer: Self-pay | Admitting: Family

## 2019-12-15 DIAGNOSIS — Z1211 Encounter for screening for malignant neoplasm of colon: Secondary | ICD-10-CM

## 2019-12-15 DIAGNOSIS — D509 Iron deficiency anemia, unspecified: Secondary | ICD-10-CM

## 2019-12-15 NOTE — Telephone Encounter (Signed)
Pt was returning call 

## 2019-12-15 NOTE — Addendum Note (Signed)
Addended by: Charm Barges on: 12/15/2019 01:25 PM   Modules accepted: Orders

## 2019-12-15 NOTE — Telephone Encounter (Signed)
IFOB order placed for [facility]

## 2019-12-15 NOTE — Telephone Encounter (Signed)
Pt needs lab order in and is asking for a call back. She said [facility] told her there were no orders to process her stool.

## 2019-12-15 NOTE — Telephone Encounter (Signed)
Order placed for IFOB  - [facility]

## 2019-12-15 NOTE — Telephone Encounter (Signed)
Left message to call back  

## 2019-12-16 ENCOUNTER — Encounter: Payer: Self-pay | Admitting: Internal Medicine

## 2019-12-16 ENCOUNTER — Encounter: Payer: Managed Care, Other (non HMO) | Admitting: Oncology

## 2019-12-16 ENCOUNTER — Other Ambulatory Visit: Payer: Managed Care, Other (non HMO)

## 2019-12-16 NOTE — Telephone Encounter (Signed)
Spoke to patient and informed her that she can go to [facility] with her IFOB. Patient verbalized understanding and had no further questions.

## 2019-12-17 LAB — FECAL OCCULT BLOOD, IMMUNOCHEMICAL: Fecal Occult Bld: POSITIVE — AB

## 2019-12-21 ENCOUNTER — Other Ambulatory Visit: Payer: Self-pay | Admitting: Internal Medicine

## 2019-12-26 ENCOUNTER — Inpatient Hospital Stay: Payer: Managed Care, Other (non HMO) | Attending: Oncology

## 2019-12-26 ENCOUNTER — Other Ambulatory Visit: Payer: Self-pay

## 2019-12-26 VITALS — BP 143/79 | HR 73 | Temp 98.4°F | Resp 20

## 2019-12-26 DIAGNOSIS — D509 Iron deficiency anemia, unspecified: Secondary | ICD-10-CM | POA: Diagnosis present

## 2019-12-26 DIAGNOSIS — E611 Iron deficiency: Secondary | ICD-10-CM

## 2019-12-26 MED ORDER — SODIUM CHLORIDE 0.9 % IV SOLN
Freq: Once | INTRAVENOUS | Status: AC
  Filled 2019-12-26: qty 250

## 2019-12-26 MED ORDER — IRON SUCROSE 20 MG/ML IV SOLN
200.0000 mg | Freq: Once | INTRAVENOUS | Status: AC
  Administered 2019-12-26: 200 mg via INTRAVENOUS
  Filled 2019-12-26: qty 10

## 2019-12-26 MED ORDER — SODIUM CHLORIDE 0.9 % IV SOLN: 200.0000 mg | Freq: Once | INTRAVENOUS | Status: DC

## 2019-12-27 ENCOUNTER — Telehealth: Payer: Self-pay | Admitting: Internal Medicine

## 2019-12-27 ENCOUNTER — Other Ambulatory Visit: Payer: Self-pay | Admitting: Internal Medicine

## 2019-12-27 DIAGNOSIS — R195 Other fecal abnormalities: Secondary | ICD-10-CM

## 2019-12-27 DIAGNOSIS — D509 Iron deficiency anemia, unspecified: Secondary | ICD-10-CM

## 2019-12-27 NOTE — Telephone Encounter (Signed)
Patient contacted regarding her test results. Reviewed with patient plan of care.

## 2019-12-27 NOTE — Telephone Encounter (Signed)
H- please inform pt that her  # Hb- 10.6 [normal -> 11]; proceed with Iv venofer as planned.   # Vit D- levels slightly low- recommend vit D3 1000 IU/day. OTC  Follow up as planned/

## 2019-12-27 NOTE — Progress Notes (Signed)
Order placed for GI referral.   

## 2020-01-02 ENCOUNTER — Inpatient Hospital Stay: Payer: Managed Care, Other (non HMO)

## 2020-01-02 ENCOUNTER — Other Ambulatory Visit: Payer: Self-pay

## 2020-01-02 VITALS — BP 146/84 | HR 66 | Temp 97.3°F | Resp 20

## 2020-01-02 DIAGNOSIS — D509 Iron deficiency anemia, unspecified: Secondary | ICD-10-CM | POA: Diagnosis not present

## 2020-01-02 DIAGNOSIS — E611 Iron deficiency: Secondary | ICD-10-CM

## 2020-01-02 MED ORDER — SODIUM CHLORIDE 0.9 % IV SOLN
Freq: Once | INTRAVENOUS | Status: AC
  Filled 2020-01-02: qty 250

## 2020-01-02 MED ORDER — IRON SUCROSE 20 MG/ML IV SOLN
200.0000 mg | Freq: Once | INTRAVENOUS | Status: AC
  Administered 2020-01-02: 200 mg via INTRAVENOUS
  Filled 2020-01-02: qty 10

## 2020-01-02 MED ORDER — SODIUM CHLORIDE 0.9 % IV SOLN: 200.0000 mg | Freq: Once | INTRAVENOUS | Status: DC

## 2020-01-03 ENCOUNTER — Ambulatory Visit: Payer: Managed Care, Other (non HMO) | Admitting: Family

## 2020-01-03 ENCOUNTER — Encounter: Payer: Self-pay | Admitting: Family

## 2020-01-03 VITALS — BP 152/98 | HR 70 | Temp 98.2°F | Resp 14 | Ht 66.0 in | Wt 209.8 lb

## 2020-01-03 DIAGNOSIS — I10 Essential (primary) hypertension: Secondary | ICD-10-CM | POA: Diagnosis not present

## 2020-01-03 DIAGNOSIS — E611 Iron deficiency: Secondary | ICD-10-CM | POA: Diagnosis not present

## 2020-01-03 DIAGNOSIS — F339 Major depressive disorder, recurrent, unspecified: Secondary | ICD-10-CM

## 2020-01-03 DIAGNOSIS — Z1231 Encounter for screening mammogram for malignant neoplasm of breast: Secondary | ICD-10-CM | POA: Diagnosis not present

## 2020-01-03 MED ORDER — AMLODIPINE BESYLATE 10 MG PO TABS: 10.0000 mg | ORAL_TABLET | Freq: Every day | ORAL | 1 refills | Status: DC

## 2020-01-03 NOTE — Patient Instructions (Addendum)
Start amlodipine 10mg   It is imperative that you are seen AT least twice per year for labs and monitoring. Monitor blood pressure at home and me 5-6 reading on separate days. Goal is less than 120/80, based on newest guidelines, however we certainly want to be less than 130/80;  if persistently higher, please make sooner follow up appointment so we can recheck you blood pressure and manage/ adjust medications.   Let me know how I can you there.    Please call  and schedule your 3D mammogram as discussed.   [facility]  [address]  [place], [place]  [place]    DASH Eating Plan DASH stands for "Dietary Approaches to Stop Hypertension." The DASH eating plan is a healthy eating plan that has been shown to reduce high blood pressure (hypertension). It may also reduce your risk for type 2 diabetes, heart disease, and stroke. The DASH eating plan may also help with weight loss. What are tips for following this plan?  General guidelines  Avoid eating more than 2,300 mg (milligrams) of salt (sodium) a day. If you have hypertension, you may need to reduce your sodium intake to 1,500 mg a day.  Limit alcohol intake to no more than 1 drink a day for nonpregnant women and 2 drinks a day for men. One drink equals 12 oz of beer, 5 oz of wine, or 1 oz of hard liquor.  Work with your health care provider to maintain a healthy body weight or to lose weight. Ask what an ideal weight is for you.  Get at least 30 minutes of exercise that causes your heart to beat faster (aerobic exercise) most days of the week. Activities may include walking, swimming, or biking.  Work with your health care provider or diet and nutrition specialist (dietitian) to adjust your eating plan to your individual calorie needs. Reading food labels   Check food labels for the amount of sodium per  serving. Choose foods with less than 5 percent of the Daily Value of sodium. Generally, foods with less than 300 mg of sodium per serving fit into this eating plan.  To find whole grains, look for the word "whole" as the first word in the ingredient list. Shopping  Buy products labeled as "low-sodium" or "no salt added."  Buy fresh foods. Avoid canned foods and premade or frozen meals. Cooking  Avoid adding salt when cooking. Use salt-free seasonings or herbs instead of table salt or sea salt. Check with your health care provider or pharmacist before using salt substitutes.  Do not fry foods. Cook foods using healthy methods such as baking, boiling, grilling, and broiling instead.  Cook with heart-healthy oils, such as olive, canola, soybean, or sunflower oil. Meal planning  Eat a balanced diet that includes: ? 5 or more servings of fruits and vegetables each day. At each meal, try to fill half of your plate with fruits and vegetables. ? Up to 6-8 servings of whole grains each day. ? Less than 6 oz of lean meat, poultry, or fish each day. A 3-oz serving of meat is about the same size as a deck of cards. One egg equals 1 oz. ? 2 servings of low-fat dairy each day. ? A serving of nuts, seeds, or beans 5 times each week. ? Heart-healthy fats. Healthy fats called Omega-3 fatty acids are found in foods such as flaxseeds and coldwater fish, like sardines, salmon, and mackerel.  Limit how much you eat of  the following: ? Canned or prepackaged foods. ? Food that is high in trans fat, such as fried foods. ? Food that is high in saturated fat, such as fatty meat. ? Sweets, desserts, sugary drinks, and other foods with added sugar. ? Full-fat dairy products.  Do not salt foods before eating.  Try to eat at least 2 vegetarian meals each week.  Eat more home-cooked food and less restaurant, buffet, and fast food.  When eating at a restaurant, ask that your food be prepared with less salt or  no salt, if possible. What foods are recommended? The items listed may not be a complete list. Talk with your dietitian about what dietary choices are best for you. Grains Whole-grain or whole-wheat bread. Whole-grain or whole-wheat pasta. Brown rice. Orpah Cobbatmeal. Quinoa. Bulgur. Whole-grain and low-sodium cereals. Pita bread. Low-fat, low-sodium crackers. Whole-wheat flour tortillas. Vegetables Fresh or frozen vegetables (raw, steamed, roasted, or grilled). Low-sodium or reduced-sodium tomato and vegetable juice. Low-sodium or reduced-sodium tomato sauce and tomato paste. Low-sodium or reduced-sodium canned vegetables. Fruits All fresh, dried, or frozen fruit. Canned fruit in natural juice (without added sugar). Meat and other protein foods Skinless chicken or Malawiturkey. Ground chicken or Malawiturkey. Pork with fat trimmed off. Fish and seafood. Egg whites. Dried beans, peas, or lentils. Unsalted nuts, nut butters, and seeds. Unsalted canned beans. Lean cuts of beef with fat trimmed off. Low-sodium, lean deli meat. Dairy Low-fat (1%) or fat-free (skim) milk. Fat-free, low-fat, or reduced-fat cheeses. Nonfat, low-sodium ricotta or cottage cheese. Low-fat or nonfat yogurt. Low-fat, low-sodium cheese. Fats and oils Soft margarine without trans fats. Vegetable oil. Low-fat, reduced-fat, or light mayonnaise and salad dressings (reduced-sodium). Canola, safflower, olive, soybean, and sunflower oils. Avocado. Seasoning and other foods Herbs. Spices. Seasoning mixes without salt. Unsalted popcorn and pretzels. Fat-free sweets. What foods are not recommended? The items listed may not be a complete list. Talk with your dietitian about what dietary choices are best for you. Grains Baked goods made with fat, such as croissants, muffins, or some breads. Dry pasta or rice meal packs. Vegetables Creamed or fried vegetables. Vegetables in a cheese sauce. Regular canned vegetables (not low-sodium or reduced-sodium).  Regular canned tomato sauce and paste (not low-sodium or reduced-sodium). Regular tomato and vegetable juice (not low-sodium or reduced-sodium). Rosita FirePickles. Olives. Fruits Canned fruit in a light or heavy syrup. Fried fruit. Fruit in cream or butter sauce. Meat and other protein foods Fatty cuts of meat. Ribs. Fried meat. Tomasa BlaseBacon. Sausage. Bologna and other processed lunch meats. Salami. Fatback. Hotdogs. Bratwurst. Salted nuts and seeds. Canned beans with added salt. Canned or smoked fish. Whole eggs or egg yolks. Chicken or Malawiturkey with skin. Dairy Whole or 2% milk, cream, and half-and-half. Whole or full-fat cream cheese. Whole-fat or sweetened yogurt. Full-fat cheese. Nondairy creamers. Whipped toppings. Processed cheese and cheese spreads. Fats and oils Butter. Stick margarine. Lard. Shortening. Ghee. Bacon fat. Tropical oils, such as coconut, palm kernel, or palm oil. Seasoning and other foods Salted popcorn and pretzels. Onion salt, garlic salt, seasoned salt, table salt, and sea salt. Worcestershire sauce. Tartar sauce. Barbecue sauce. Teriyaki sauce. Soy sauce, including reduced-sodium. Steak sauce. Canned and packaged gravies. Fish sauce. Oyster sauce. Cocktail sauce. Horseradish that you find on the shelf. Ketchup. Mustard. Meat flavorings and tenderizers. Bouillon cubes. Hot sauce and Tabasco sauce. Premade or packaged marinades. Premade or packaged taco seasonings. Relishes. Regular salad dressings. Where to find more information:  National Heart, Lung, and Blood Institute: PopSteam.iswww.nhlbi.nih.gov  American Heart Association:  www.heart.org Summary  The DASH eating plan is a healthy eating plan that has been shown to reduce high blood pressure (hypertension). It may also reduce your risk for type 2 diabetes, heart disease, and stroke.  With the DASH eating plan, you should limit salt (sodium) intake to 2,300 mg a day. If you have hypertension, you may need to reduce your sodium intake to 1,500 mg  a day.  When on the DASH eating plan, aim to eat more fresh fruits and vegetables, whole grains, lean proteins, low-fat dairy, and heart-healthy fats.  Work with your health care provider or diet and nutrition specialist (dietitian) to adjust your eating plan to your individual calorie needs. This information is not intended to replace advice given to you by your health care provider. Make sure you discuss any questions you have with your health care provider. Document Revised: 05/15/2017 Document Reviewed: 05/26/2016 Elsevier Patient Education  2020 [employer].  Managing Your Hypertension Hypertension is commonly called high blood pressure. This is when the force of your blood pressing against the walls of your arteries is too strong. Arteries are blood vessels that carry blood from your heart throughout your body. Hypertension forces the heart to work harder to pump blood, and may cause the arteries to become narrow or stiff. Having untreated or uncontrolled hypertension can cause heart attack, stroke, kidney disease, and other problems. What are blood pressure readings? A blood pressure reading consists of a higher number over a lower number. Ideally, your blood pressure should be below 120/80. The first ("top") number is called the systolic pressure. It is a measure of the pressure in your arteries as your heart beats. The second ("bottom") number is called the diastolic pressure. It is a measure of the pressure in your arteries as the heart relaxes. What does my blood pressure reading mean? Blood pressure is classified into four stages. Based on your blood pressure reading, your health care provider may use the following stages to determine what type of treatment you need, if any. Systolic pressure and diastolic pressure are measured in a unit called mm Hg. Normal  Systolic pressure: below 120.  Diastolic pressure: below 80. Elevated  Systolic pressure: 120-129.  Diastolic pressure:  below 80. Hypertension stage 1  Systolic pressure: 130-139.  Diastolic pressure: 80-89. Hypertension stage 2  Systolic pressure: 140 or above.  Diastolic pressure: 90 or above. What health risks are associated with hypertension? Managing your hypertension is an important responsibility. Uncontrolled hypertension can lead to:  A heart attack.  A stroke.  A weakened blood vessel (aneurysm).  Heart failure.  Kidney damage.  Eye damage.  Metabolic syndrome.  Memory and concentration problems. What changes can I make to manage my hypertension? Hypertension can be managed by making lifestyle changes and possibly by taking medicines. Your health care provider will help you make a plan to bring your blood pressure within a normal range. Eating and drinking   Eat a diet that is high in fiber and potassium, and low in salt (sodium), added sugar, and fat. An example eating plan is called the DASH (Dietary Approaches to Stop Hypertension) diet. To eat this way: ? Eat plenty of fresh fruits and vegetables. Try to fill half of your plate at each meal with fruits and vegetables. ? Eat whole grains, such as whole wheat pasta, brown rice, or whole grain bread. Fill about one quarter of your plate with whole grains. ? Eat low-fat diary products. ? Avoid fatty cuts of meat, processed  or cured meats, and poultry with skin. Fill about one quarter of your plate with lean proteins such as fish, chicken without skin, beans, eggs, and tofu. ? Avoid premade and processed foods. These tend to be higher in sodium, added sugar, and fat.  Reduce your daily sodium intake. Most people with hypertension should eat less than 1,500 mg of sodium a day.  Limit alcohol intake to no more than 1 drink a day for nonpregnant women and 2 drinks a day for men. One drink equals 12 oz of beer, 5 oz of wine, or 1 oz of hard liquor. Lifestyle  Work with your health care provider to maintain a healthy body weight, or  to lose weight. Ask what an ideal weight is for you.  Get at least 30 minutes of exercise that causes your heart to beat faster (aerobic exercise) most days of the week. Activities may include walking, swimming, or biking.  Include exercise to strengthen your muscles (resistance exercise), such as weight lifting, as part of your weekly exercise routine. Try to do these types of exercises for 30 minutes at least 3 days a week.  Do not use any products that contain nicotine or tobacco, such as cigarettes and e-cigarettes. If you need help quitting, ask your health care provider.  Control any long-term (chronic) conditions you have, such as high cholesterol or diabetes. Monitoring  Monitor your blood pressure at home as told by your health care provider. Your personal target blood pressure may vary depending on your medical conditions, your age, and other factors.  Have your blood pressure checked regularly, as often as told by your health care provider. Working with your health care provider  Review all the medicines you take with your health care provider because there may be side effects or interactions.  Talk with your health care provider about your diet, exercise habits, and other lifestyle factors that may be contributing to hypertension.  Visit your health care provider regularly. Your health care provider can help you create and adjust your plan for managing hypertension. Will I need medicine to control my blood pressure? Your health care provider may prescribe medicine if lifestyle changes are not enough to get your blood pressure under control, and if:  Your systolic blood pressure is 130 or higher.  Your diastolic blood pressure is 80 or higher. Take medicines only as told by your health care provider. Follow the directions carefully. Blood pressure medicines must be taken as prescribed. The medicine does not work as well when you skip doses. Skipping doses also puts you at risk  for problems. Contact a health care provider if:  You think you are having a reaction to medicines you have taken.  You have repeated (recurrent) headaches.  You feel dizzy.  You have swelling in your ankles.  You have trouble with your vision. Get help right away if:  You develop a severe headache or confusion.  You have unusual weakness or numbness, or you feel faint.  You have severe pain in your chest or abdomen.  You vomit repeatedly.  You have trouble breathing. Summary  Hypertension is when the force of blood pumping through your arteries is too strong. If this condition is not controlled, it may put you at risk for serious complications.  Your personal target blood pressure may vary depending on your medical conditions, your age, and other factors. For most people, a normal blood pressure is less than 120/80.  Hypertension is managed by lifestyle changes, medicines, or  both. Lifestyle changes include weight loss, eating a healthy, low-sodium diet, exercising more, and limiting alcohol. This information is not intended to replace advice given to you by your health care provider. Make sure you discuss any questions you have with your health care provider. Document Revised: 09/24/2018 Document Reviewed: 04/30/2016 Elsevier Patient Education  2020 [employer].

## 2020-01-03 NOTE — Assessment & Plan Note (Signed)
Overall well controlled with exception of recent stress regarding paying for health bills, and fatigue from anemia.  Patient politely declines augmentation for depression medication.  She would like to continue Wellbutrin.  I have given her financial assistance information and printed [facility] form today for her to complete.  She will call me with any concerns

## 2020-01-03 NOTE — Assessment & Plan Note (Signed)
Status post 2 IV iron infusions.  She is following up with Dr Donneta Romberg, upcoming appointment with gastroenterology, Dr. Allegra Lai.  Will follow

## 2020-01-03 NOTE — Assessment & Plan Note (Signed)
Elevated, increase amlodipine.  Patient will send blood pressure readings from home.

## 2020-01-03 NOTE — Progress Notes (Signed)
Subjective:    Patient ID: Anna Mcgee, female    DOB: March 14, 1961, 59 y.o.   MRN: [id number]  CC: Anna Mcgee is a 59 y.o. female who presents today for follow up.   HPI: Overall feels well today, no new complaints.  HTN- at home, 146/84. No cp, sob, ha.  Took amlodipine about an hour ago.   Depression- overally controlled with wellbutrin. Has been stressed over paying health bills. Medications are inexpensive. Continues to feel 'fatigued from anemia' and thinks contributing as well to depression. She feels tired most of the time and hasnt noticed improvement on iron infusions yet.      positive stool cards 12/15/19 hemoglobin 10 12/09/19 Ferritin 10 Iron infusions with Dr Donneta Romberg Dr Allegra Lai 03/02/20  Due for cologuard. , we have decided to purse colonoscopy Due mammogram  HISTORY:  Past Medical History:  Diagnosis Date  . Heart murmur   . History of anemia   . Hypertension   . Postmenopausal bleeding   . Vitamin D deficiency   . Vitamin D deficiency    Past Surgical History:  Procedure Laterality Date  . GASTRIC BYPASS  2006   Family History  Problem Relation Age of Onset  . Hypertension Mother   . Hypertension Father   . Arthritis Maternal Grandmother   . Colon cancer Neg Hx     Allergies: Penicillins Current Outpatient Medications on File Prior to Visit  Medication Sig Dispense Refill  . buPROPion (WELLBUTRIN XL) 300 MG 24 hr tablet Take 1 tablet (300 mg total) by mouth every morning. 90 tablet 0  . loratadine (CLARITIN) 10 MG tablet Take 1 tablet (10 mg total) by mouth daily. 30 tablet 2  . triamcinolone cream (KENALOG) 0.1 % Apply 1 application topically 2 (two) times daily. 30 g 0   No current facility-administered medications on file prior to visit.    Social History   Tobacco Use  . Smoking status: Former Smoker    Types: Cigarettes    Quit date: 08/01/2010    Years since quitting: 9.4  . Smokeless tobacco: Never Used  . Tobacco comment:  Vapes  Vaping Use  . Vaping Use: Some days  Substance Use Topics  . Alcohol use: Yes    Alcohol/week: 0.0 standard drinks    Comment: Rare  . Drug use: No    Review of Systems  Constitutional: Positive for fatigue. Negative for chills and fever.  Respiratory: Negative for cough and shortness of breath.   Cardiovascular: Negative for chest pain, palpitations and leg swelling.  Gastrointestinal: Negative for nausea and vomiting.  Psychiatric/Behavioral: Negative for sleep disturbance and suicidal ideas.      Objective:    BP (!) 152/98 (BP Location: Left Arm, Patient Position: Sitting, Cuff Size: Large)   Pulse 70   Temp 98.2 F (36.8 C) (Oral)   Resp 14   Ht 5\' 6"  (1.676 m)   Wt 209 lb 12.8 oz (95.2 kg)   SpO2 97%   BMI 33.86 kg/m  BP Readings from Last 3 Encounters:  01/03/20 (!) 152/98  01/02/20 (!) 146/84  12/26/19 (!) 143/79   Wt Readings from Last 3 Encounters:  01/03/20 209 lb 12.8 oz (95.2 kg)  12/14/19 204 lb 6.4 oz (92.7 kg)  12/09/19 203 lb (92.1 kg)    Physical Exam Vitals reviewed.  Constitutional:      Appearance: She is well-developed.  Eyes:     Conjunctiva/sclera: Conjunctivae normal.  Cardiovascular:     Rate  and Rhythm: Normal rate and regular rhythm.     Pulses: Normal pulses.     Heart sounds: Normal heart sounds.  Pulmonary:     Effort: Pulmonary effort is normal.     Breath sounds: Normal breath sounds. No wheezing, rhonchi or rales.  Skin:    General: Skin is warm and dry.  Neurological:     Mental Status: She is alert.  Psychiatric:        Speech: Speech normal.        Behavior: Behavior normal.        Thought Content: Thought content normal.        Assessment & Plan:   Problem List Items Addressed This Visit      Cardiovascular and Mediastinum   HTN (hypertension)    Elevated, increase amlodipine.  Patient will send blood pressure readings from home.      Relevant Medications   amLODipine (NORVASC) 10 MG tablet      Other   Depression, recurrent (HCC)    Overall well controlled with exception of recent stress regarding paying for health bills, and fatigue from anemia.  Patient politely declines augmentation for depression medication.  She would like to continue Wellbutrin.  I have given her financial assistance information and printed [facility] form today for her to complete.  She will call me with any concerns      Iron deficiency    Status post 2 IV iron infusions.  She is following up with Dr Donneta Romberg, upcoming appointment with gastroenterology, Dr. Allegra Lai.  Will follow       Other Visit Diagnoses    Encounter for screening mammogram for malignant neoplasm of breast    -  Primary   Relevant Orders   MM 3D SCREEN BREAST BILATERAL     Of note, patient understands to schedule mammogram  I have discontinued Angla A. Mangold's cetirizine. I have also changed her amLODipine. Additionally, I am having her maintain her loratadine, buPROPion, and triamcinolone cream.   Meds ordered this encounter  Medications  . amLODipine (NORVASC) 10 MG tablet    Sig: Take 1 tablet (10 mg total) by mouth daily.    Dispense:  90 tablet    Refill:  1    Order Specific Question:   Supervising Provider    Answer:   Sherlene Shams [[id number]]    Return precautions given.   Risks, benefits, and alternatives of the medications and treatment plan prescribed today were discussed, and patient expressed understanding.   Education regarding symptom management and diagnosis given to patient on AVS.  Continue to follow with Allegra Grana, FNP for routine health maintenance.   Anna Mcgee and I agreed with plan.   Rennie Plowman, FNP

## 2020-01-09 ENCOUNTER — Telehealth: Payer: Self-pay | Admitting: Family

## 2020-01-09 ENCOUNTER — Telehealth: Payer: Self-pay | Admitting: *Deleted

## 2020-01-09 ENCOUNTER — Other Ambulatory Visit: Payer: Self-pay

## 2020-01-09 ENCOUNTER — Inpatient Hospital Stay: Payer: Managed Care, Other (non HMO)

## 2020-01-09 VITALS — BP 133/74 | HR 69 | Temp 97.3°F | Resp 18

## 2020-01-09 DIAGNOSIS — E611 Iron deficiency: Secondary | ICD-10-CM

## 2020-01-09 DIAGNOSIS — F339 Major depressive disorder, recurrent, unspecified: Secondary | ICD-10-CM

## 2020-01-09 DIAGNOSIS — H9203 Otalgia, bilateral: Secondary | ICD-10-CM

## 2020-01-09 DIAGNOSIS — D509 Iron deficiency anemia, unspecified: Secondary | ICD-10-CM | POA: Diagnosis not present

## 2020-01-09 MED ORDER — LORATADINE 10 MG PO TABS: 10.0000 mg | ORAL_TABLET | Freq: Every day | ORAL | 2 refills | Status: DC

## 2020-01-09 MED ORDER — BUPROPION HCL ER (XL) 300 MG PO TB24: 300.0000 mg | ORAL_TABLET | Freq: Every morning | ORAL | 0 refills | Status: DC

## 2020-01-09 MED ORDER — SODIUM CHLORIDE 0.9 % IV SOLN: 200.0000 mg | Freq: Once | INTRAVENOUS | Status: DC

## 2020-01-09 MED ORDER — IRON SUCROSE 20 MG/ML IV SOLN
200.0000 mg | Freq: Once | INTRAVENOUS | Status: AC
  Administered 2020-01-09: 200 mg via INTRAVENOUS
  Filled 2020-01-09: qty 10

## 2020-01-09 MED ORDER — SODIUM CHLORIDE 0.9 % IV SOLN
Freq: Once | INTRAVENOUS | Status: AC
  Filled 2020-01-09: qty 250

## 2020-01-09 NOTE — Telephone Encounter (Signed)
Patient c/o increasing fatigue. Per Dr. Donneta Romberg - md will like to order a cbc. Patient is a [employer] employee. Pt given [employer] req.

## 2020-01-09 NOTE — Telephone Encounter (Signed)
Pt needs a refill on Claritin and Wellbutrin.

## 2020-01-12 ENCOUNTER — Encounter: Payer: Self-pay | Admitting: Internal Medicine

## 2020-01-16 ENCOUNTER — Other Ambulatory Visit: Payer: Self-pay

## 2020-01-16 ENCOUNTER — Inpatient Hospital Stay: Payer: Managed Care, Other (non HMO) | Attending: Internal Medicine

## 2020-01-16 DIAGNOSIS — Z87891 Personal history of nicotine dependence: Secondary | ICD-10-CM | POA: Insufficient documentation

## 2020-01-16 DIAGNOSIS — Z79899 Other long term (current) drug therapy: Secondary | ICD-10-CM | POA: Insufficient documentation

## 2020-01-16 DIAGNOSIS — Z9884 Bariatric surgery status: Secondary | ICD-10-CM | POA: Insufficient documentation

## 2020-01-16 DIAGNOSIS — E611 Iron deficiency: Secondary | ICD-10-CM

## 2020-01-16 DIAGNOSIS — D509 Iron deficiency anemia, unspecified: Secondary | ICD-10-CM | POA: Insufficient documentation

## 2020-01-16 MED ORDER — SODIUM CHLORIDE 0.9 % IV SOLN: 200.0000 mg | Freq: Once | INTRAVENOUS | Status: DC

## 2020-01-16 MED ORDER — IRON SUCROSE 20 MG/ML IV SOLN
200.0000 mg | Freq: Once | INTRAVENOUS | Status: DC
  Filled 2020-01-16: qty 10

## 2020-01-16 MED ORDER — SODIUM CHLORIDE 0.9 % IV SOLN
Freq: Once | INTRAVENOUS | Status: DC
  Filled 2020-01-16: qty 250

## 2020-01-16 NOTE — Progress Notes (Signed)
Unable to obtain IV access. Dr Donneta Romberg aware and informed that patient is feeling a lot better, energy levels have improved. Patient to return for her scheduled appointment on 8/25. Instructed patient to drinks lots of fluids before, patient verbalized understanding

## 2020-01-23 ENCOUNTER — Telehealth: Payer: Self-pay | Admitting: Family

## 2020-01-23 DIAGNOSIS — I10 Essential (primary) hypertension: Secondary | ICD-10-CM

## 2020-01-23 MED ORDER — LOSARTAN POTASSIUM 50 MG PO TABS: 50.0000 mg | ORAL_TABLET | Freq: Every day | ORAL | 3 refills | Status: DC

## 2020-01-23 NOTE — Telephone Encounter (Signed)
You spoke with her , correct? Are labs schedule for one week out?

## 2020-01-23 NOTE — Telephone Encounter (Signed)
Call pt  She may stop amlodipine I sent in losartan for her to start  Let her know we have placed an appt for her on weds of this week at 1130  Please sch BMP lab in ONE week

## 2020-01-23 NOTE — Telephone Encounter (Signed)
Pt called and said that the amLODipine (NORVASC) 10 MG tablet are causing her legs and feet to swell  And she stopped the medication and wanted to know the next step

## 2020-01-23 NOTE — Telephone Encounter (Signed)
Pt has an appt scheduled 8/11 at 12 but can not get here until 12:15. I offered her next available appt in September and she states that her feet and legs were swelling due to new medication? Please call back to advise

## 2020-01-23 NOTE — Telephone Encounter (Signed)
Spoke to Loyal and informed patient that that is fine.

## 2020-01-23 NOTE — Addendum Note (Signed)
Addended by: Allegra Grana on: 01/23/2020 09:14 AM   Modules accepted: Orders

## 2020-01-23 NOTE — Telephone Encounter (Signed)
Left message to call back. Pt has an appointment scheduled for 01/25/20 at 12pm. Need to verify if the patient can make that appointment time and schedule a repeat lab in 1 week.

## 2020-01-24 NOTE — Telephone Encounter (Signed)
Left a voicemail to call back and schedule a lab visit in 1 week.

## 2020-01-25 ENCOUNTER — Telehealth: Payer: Self-pay | Admitting: Family

## 2020-01-25 ENCOUNTER — Ambulatory Visit: Payer: Managed Care, Other (non HMO) | Admitting: Family

## 2020-01-25 DIAGNOSIS — Z0289 Encounter for other administrative examinations: Secondary | ICD-10-CM

## 2020-01-25 NOTE — Telephone Encounter (Signed)
Attempted to call patient. Left a voicemail to call back and schedule a office visit.

## 2020-01-25 NOTE — Telephone Encounter (Signed)
Pt called to cancel appt for 01/25/20. I offered her another appt and she said she would have to call back later in the month but still wanted someone to talk to her about her feet swelling. I explained to her the importance of her having an appt and she still refused. Please advise

## 2020-01-25 NOTE — Telephone Encounter (Signed)
See telephone note.

## 2020-01-25 NOTE — Telephone Encounter (Signed)
Left vm regarding importance of rescheduling appt She may telephone visit and we can schedule Is swelling BILATERAL? If not she needs triage and would need to be seen in UC.   Has swelling improved off amlodipine?  Has she started losartan? VERY impt to check labs ONE week after starting losartan  She may make an appt with [employer] as out of office until [employer].

## 2020-01-26 NOTE — Telephone Encounter (Signed)
Attempted to call the patient again to ask provider questions and reschedule. 3rd attempt to reach patient including provider message and voicemail. Letter has been sent to the patient with Provider questions.

## 2020-01-26 NOTE — Telephone Encounter (Signed)
Patient returned call and states that the swelling was bilateral and that she has stopped takin the Amlodipine. The swelling has gone down since stopping the amlodipine. She will pick up the losartan tomorrow and will go to [facility] to have her labs done next wee. She has scheduled for a virtual visit on 02/10/20 to see the provider.

## 2020-02-07 ENCOUNTER — Telehealth: Payer: Self-pay | Admitting: Family

## 2020-02-07 NOTE — Telephone Encounter (Signed)
LMTCB I see not labs ordered or recent labs drawn.

## 2020-02-07 NOTE — Telephone Encounter (Signed)
Pt wanted to have labs sent to [facility]

## 2020-02-08 ENCOUNTER — Ambulatory Visit: Payer: Managed Care, Other (non HMO) | Admitting: Internal Medicine

## 2020-02-08 ENCOUNTER — Ambulatory Visit: Payer: Managed Care, Other (non HMO)

## 2020-02-09 ENCOUNTER — Encounter: Payer: Self-pay | Admitting: Internal Medicine

## 2020-02-10 ENCOUNTER — Encounter: Payer: Self-pay | Admitting: Family

## 2020-02-10 ENCOUNTER — Other Ambulatory Visit: Payer: Self-pay

## 2020-02-10 ENCOUNTER — Telehealth (INDEPENDENT_AMBULATORY_CARE_PROVIDER_SITE_OTHER): Payer: Managed Care, Other (non HMO) | Admitting: Family

## 2020-02-10 DIAGNOSIS — I10 Essential (primary) hypertension: Secondary | ICD-10-CM

## 2020-02-10 DIAGNOSIS — E559 Vitamin D deficiency, unspecified: Secondary | ICD-10-CM | POA: Insufficient documentation

## 2020-02-10 DIAGNOSIS — E669 Obesity, unspecified: Secondary | ICD-10-CM | POA: Insufficient documentation

## 2020-02-10 DIAGNOSIS — D649 Anemia, unspecified: Secondary | ICD-10-CM | POA: Diagnosis not present

## 2020-02-10 NOTE — Assessment & Plan Note (Signed)
No readings since she has started losartan. She will get her labs done in 4 days with [facility].she is unable to her labs today however understands the importance of having done as soon as possible.

## 2020-02-10 NOTE — Assessment & Plan Note (Signed)
Resolved. Will follow.  

## 2020-02-10 NOTE — Telephone Encounter (Signed)
I spoke with patient on virtual & she would like her BMP ordered for [facility]. I have ordered.

## 2020-02-10 NOTE — Progress Notes (Signed)
Virtual Visit via Video Note  I connected with@  on 02/10/20 at 11:30 AM EDT by a video enabled telemedicine application and verified that I am speaking with the correct person using two identifiers.  Location patient: home Location provider:work  Persons participating in the virtual visit: patient, provider  I discussed the limitations of evaluation and management by telemedicine and the availability of in person appointments. The patient expressed understanding and agreed to proceed.  Interactive audio and video telecommunications were attempted between this provider and patient, however failed, due to patient having technical difficulties or patient did not have access to video capability.  We continued and completed visit with audio only.    HPI:  Feels well today. Follow up blood pressure.   HTN- doing well on losartan , hasnt checked bp since started  Leg swelling has resolved. Planning to get BMP on Tuesday.   Anemia- resolved based on cbc 02/08/20  Planning to have colonoscopy with dr Allegra Lai    ROS: See pertinent positives and negatives per HPI.  Past Medical History:  Diagnosis Date  . Heart murmur   . History of anemia   . Hypertension   . Postmenopausal bleeding   . Vitamin D deficiency   . Vitamin D deficiency     Past Surgical History:  Procedure Laterality Date  . GASTRIC BYPASS  2006    Family History  Problem Relation Age of Onset  . Hypertension Mother   . Hypertension Father   . Arthritis Maternal Grandmother   . Colon cancer Neg Hx       Current Outpatient Medications:  .  buPROPion (WELLBUTRIN XL) 300 MG 24 hr tablet, Take 1 tablet (300 mg total) by mouth every morning., Disp: 90 tablet, Rfl: 0 .  loratadine (CLARITIN) 10 MG tablet, Take 1 tablet (10 mg total) by mouth daily., Disp: 30 tablet, Rfl: 2 .  losartan (COZAAR) 50 MG tablet, Take 1 tablet (50 mg total) by mouth daily., Disp: 90 tablet, Rfl: 3 .  triamcinolone cream (KENALOG) 0.1 %,  Apply 1 application topically 2 (two) times daily., Disp: 30 g, Rfl: 0  EXAM:  VITALS per patient if applicable: BP Readings from Last 3 Encounters:  01/09/20 (!) 133/74  01/03/20 (!) 152/98  01/02/20 (!) 146/84     PSYCH/NEURO: pleasant and cooperative, no obvious depression or anxiety, speech and thought processing grossly intact  ASSESSMENT AND PLAN:  Discussed the following assessment and plan:  Essential hypertension  Anemia, unspecified type Problem List Items Addressed This Visit      Cardiovascular and Mediastinum   HTN (hypertension)    No readings since she has started losartan. She will get her labs done in 4 days with [facility].she is unable to her labs today however understands the importance of having done as soon as possible.         Other   Anemia    Resolved. Will follow         -we discussed possible serious and likely etiologies, options for evaluation and workup, limitations of telemedicine visit vs in person visit, treatment, treatment risks and precautions. Pt prefers to treat via telemedicine empirically rather then risking or undertaking an in person visit at this moment.  Work/School slipped offered: Advised to seek prompt follow up telemedicine visit or in person care if worsening, new symptoms arise, or if is not improving with treatment. Did let her know that I only do telemedicine on Tuesdays and Thursdays for [facility] and advised follow up  visit with PCP or [facility] if needs follow up or if any further questions arise to avoid any delays.   I discussed the assessment and treatment plan with the patient. The patient was provided an opportunity to ask questions and all were answered. The patient agreed with the plan and demonstrated an understanding of the instructions.   The patient was advised to call back or seek an in-person evaluation if the symptoms worsen or if the condition fails to improve as anticipated. I have spent 15  minutes with a patient  including precharting, exam, reviewing medical records, and discussion plan of care.      Rennie Plowman, FNP

## 2020-02-13 ENCOUNTER — Ambulatory Visit: Payer: Managed Care, Other (non HMO) | Admitting: Dermatology

## 2020-02-14 ENCOUNTER — Inpatient Hospital Stay (HOSPITAL_BASED_OUTPATIENT_CLINIC_OR_DEPARTMENT_OTHER): Payer: Managed Care, Other (non HMO) | Admitting: Internal Medicine

## 2020-02-14 ENCOUNTER — Encounter: Payer: Self-pay | Admitting: Internal Medicine

## 2020-02-14 ENCOUNTER — Other Ambulatory Visit: Payer: Self-pay

## 2020-02-14 ENCOUNTER — Telehealth: Payer: Self-pay | Admitting: Family

## 2020-02-14 ENCOUNTER — Inpatient Hospital Stay: Payer: Managed Care, Other (non HMO)

## 2020-02-14 DIAGNOSIS — Z87891 Personal history of nicotine dependence: Secondary | ICD-10-CM | POA: Diagnosis not present

## 2020-02-14 DIAGNOSIS — D509 Iron deficiency anemia, unspecified: Secondary | ICD-10-CM | POA: Diagnosis present

## 2020-02-14 DIAGNOSIS — E611 Iron deficiency: Secondary | ICD-10-CM | POA: Diagnosis not present

## 2020-02-14 DIAGNOSIS — Z9884 Bariatric surgery status: Secondary | ICD-10-CM | POA: Diagnosis not present

## 2020-02-14 DIAGNOSIS — Z79899 Other long term (current) drug therapy: Secondary | ICD-10-CM | POA: Diagnosis not present

## 2020-02-14 NOTE — Assessment & Plan Note (Addendum)
#   Iron def anemia-June 2021 iron sat -10%; ferritin-6; hemoglobin February 2021-11; s/p IV venofer weekly x4; Hb-11.6-with improvement of symptoms.  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   #History of smoking/ Discussed regarding lung cancer screening program at length - still undecided; she will speak to PCP.   # DISPOSITION: # HOLD Venfoer today # IV venofer  Next week # follow up in 3 months- MD ;[facility]-cbc/bmp/iron studies/ferritin- prior- possible venofer-Dr.B

## 2020-02-14 NOTE — Telephone Encounter (Signed)
FYI

## 2020-02-14 NOTE — Telephone Encounter (Signed)
Patient called in her BP reading for today 137/65.

## 2020-02-14 NOTE — Progress Notes (Signed)
[facility] CONSULT NOTE  Patient Care Team: Allegra Grana, FNP as PCP - General (Family Medicine)  CHIEF COMPLAINTS/PURPOSE OF CONSULTATION:    HEMATOLOGY HISTORY;  # June 2021-fatigue [ferritin 10; iron saturation 6%; February 2021-hemoglobin 11; PCP];  EGD/colonoscopy-NONE [cologard- NEG]  # GASTRIC BYPASS [2006]; history of smoking; menopause.  HISTORY OF PRESENTING ILLNESS:  Anna Mcgee 59 y.o.  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  Patient noted to have improvement of energy levels post IV iron infusion.  Denies any bloody or black stools.   Review of Systems  Constitutional: Positive for malaise/fatigue. Negative for chills, diaphoresis, fever and weight loss.  HENT: Negative for nosebleeds and sore throat.   Eyes: Negative for double vision.  Respiratory: Negative for cough, hemoptysis, sputum production, shortness of breath and wheezing.   Cardiovascular: Negative for chest pain, palpitations, orthopnea and leg swelling.  Gastrointestinal: Negative for abdominal pain, blood in stool, constipation, diarrhea, heartburn, melena, nausea and vomiting.  Genitourinary: Negative for dysuria, frequency and urgency.  Musculoskeletal: Positive for back pain and joint pain.  Skin: Negative.  Negative for itching and rash.  Neurological: Negative for dizziness, tingling, focal weakness, weakness and headaches.  Endo/Heme/Allergies: Does not bruise/bleed easily.  Psychiatric/Behavioral: Negative for depression. The patient is not nervous/anxious and does not have insomnia.     MEDICAL HISTORY:  Past Medical History:  Diagnosis Date  . Heart murmur   . History of anemia   . Hypertension   . Postmenopausal bleeding   . Vitamin D deficiency   . Vitamin D deficiency     SURGICAL HISTORY: Past Surgical History:  Procedure Laterality Date  . GASTRIC BYPASS  2006    SOCIAL HISTORY: Social  History   Socioeconomic History  . Marital status: Single    Spouse name: Not on file  . Number of children: Not on file  . Years of education: Not on file  . Highest education level: Not on file  Occupational History  . Not on file  Tobacco Use  . Smoking status: Former Smoker    Types: Cigarettes    Quit date: 08/01/2010    Years since quitting: 9.6  . Smokeless tobacco: Never Used  . Tobacco comment: Vapes  Vaping Use  . Vaping Use: Some days  Substance and Sexual Activity  . Alcohol use: Yes    Alcohol/week: 0.0 standard drinks    Comment: Rare  . Drug use: No  . Sexual activity: Not Currently  Other Topics Concern  . Not on file  Social History Narrative   Single ; Employed at [employer] ; Children- 0 ; Caffeine- coffee 3 cups, diet soda 1, unsweet tea       Lives in [place]. Quit smoking- ~10 years; no alcohol. vapes- nicotine.    Social Determinants of Health   Financial Resource Strain:   . Difficulty of Paying Living Expenses: Not on file  Food Insecurity:   . Worried About Programme researcher, broadcasting/film/video in the Last Year: Not on file  . Ran Out of Food in the Last Year: Not on file  Transportation Needs:   . Lack of Transportation (Medical): Not on file  . Lack of Transportation (Non-Medical): Not on file  Physical Activity:   . Days of Exercise per Week: Not on file  . Minutes of Exercise per Session: Not on file  Stress:   . Feeling of Stress : Not on file  Social Connections:   .  Frequency of Communication with Friends and Family: Not on file  . Frequency of Social Gatherings with Friends and Family: Not on file  . Attends Religious Services: Not on file  . Active Member of Clubs or Organizations: Not on file  . Attends Banker Meetings: Not on file  . Marital Status: Not on file  Intimate Partner Violence:   . Fear of Current or Ex-Partner: Not on file  . Emotionally Abused: Not on file  . Physically Abused: Not on file  . Sexually  Abused: Not on file    FAMILY HISTORY: Family History  Problem Relation Age of Onset  . Hypertension Mother   . Hypertension Father   . Arthritis Maternal Grandmother   . Colon cancer Neg Hx     ALLERGIES:  is allergic to amlodipine and penicillins.  MEDICATIONS:  Current Outpatient Medications  Medication Sig Dispense Refill  . buPROPion (WELLBUTRIN XL) 300 MG 24 hr tablet Take 1 tablet (300 mg total) by mouth every morning. 90 tablet 0  . loratadine (CLARITIN) 10 MG tablet Take 1 tablet (10 mg total) by mouth daily. 30 tablet 2  . losartan (COZAAR) 50 MG tablet Take 1 tablet (50 mg total) by mouth daily. 90 tablet 3  . triamcinolone cream (KENALOG) 0.1 % Apply 1 application topically 2 (two) times daily. 30 g 0   No current facility-administered medications for this visit.      PHYSICAL EXAMINATION:   Vitals:   02/14/20 1258  BP: 137/65  Pulse: 83  Resp: 16  Temp: (!) 97.5 F (36.4 C)  SpO2: 100%   Filed Weights   02/14/20 1258  Weight: 214 lb (97.1 kg)    Physical Exam HENT:     Head: Normocephalic and atraumatic.     Mouth/Throat:     Pharynx: No oropharyngeal exudate.  Eyes:     Pupils: Pupils are equal, round, and reactive to light.  Cardiovascular:     Rate and Rhythm: Normal rate and regular rhythm.  Pulmonary:     Effort: No respiratory distress.     Breath sounds: No wheezing.  Abdominal:     General: Bowel sounds are normal. There is no distension.     Palpations: Abdomen is soft. There is no mass.     Tenderness: There is no abdominal tenderness. There is no guarding or rebound.  Musculoskeletal:        General: No tenderness. Normal range of motion.     Cervical back: Normal range of motion and neck supple.  Skin:    General: Skin is warm.  Neurological:     Mental Status: She is alert and oriented to person, place, and time.  Psychiatric:        Mood and Affect: Affect normal.     LABORATORY DATA:  I have reviewed the data as  listed Lab Results  Component Value Date   WBC 4.7 08/08/2019   HGB 11.0 (L) 08/08/2019   HCT 35.0 (L) 08/08/2019   MCV 82.5 08/08/2019   PLT 291 08/08/2019   Recent Labs    04/21/19 1425 08/08/19 2110 02/29/20 0748  NA 139 140 142  K 4.2 3.7 4.1  CL 104 105 104  CO2 22 26 24   GLUCOSE 77 112* 85  BUN 16 18 21   CREATININE 0.67 1.00 0.98  CALCIUM 9.3 9.3 9.4  GFRNONAA 97 >60 63  GFRAA 111 >60 73  PROT 7.2 7.3  --   ALBUMIN 4.4 4.1  --  AST 20 26  --   ALT 13 18  --   ALKPHOS 86 66  --   BILITOT <0.2 0.6  --      No results found.  Iron deficiency # Iron def anemia-June 2021 iron sat -10%; ferritin-6; hemoglobin February 2021-11; s/p IV venofer weekly x4; Hb-11.6-with improvement of symptoms.  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   #History of smoking/ Discussed regarding lung cancer screening program at length - still undecided; she will speak to PCP.   # DISPOSITION: # HOLD Venfoer today # IV venofer  Next week # follow up in 3 months- MD ;[facility]-cbc/bmp/iron studies/ferritin- prior- possible venofer-Dr.B  All questions were answered. The patient knows to call the clinic with any problems, questions or concerns.   Earna Coder, MD 03/18/2020 9:04 AM

## 2020-02-15 NOTE — Telephone Encounter (Signed)
LMTCB

## 2020-02-15 NOTE — Telephone Encounter (Signed)
BP Readings from Last 3 Encounters:  02/14/20 137/65  01/09/20 (!) 133/74  01/03/20 (!) 152/98   Call pt I have received BMP lab after we started losartan 02/10/20. Please have done asap.   Blood pressure close to goal but not quite < 130/80.  Advise to take losartan at bedtime to see if makes a difference She needs to call office for f/u appt if not at goal as we may need to adjust regimen

## 2020-02-16 NOTE — Telephone Encounter (Signed)
LM to call back. Patient needs to be scheduled for BMP ASAP & advised to take losartan at night.

## 2020-02-23 NOTE — Telephone Encounter (Signed)
Patient was returning call stated that if the order for labs are put in she works at [employer] and she can get it done there

## 2020-02-23 NOTE — Telephone Encounter (Signed)
LMTCB to be scheduled for BMP.

## 2020-02-23 NOTE — Telephone Encounter (Signed)
LM advising patient that lab was ordered for [facility].

## 2020-02-24 ENCOUNTER — Other Ambulatory Visit: Payer: Self-pay

## 2020-02-24 ENCOUNTER — Inpatient Hospital Stay: Payer: Managed Care, Other (non HMO) | Attending: Internal Medicine

## 2020-02-24 DIAGNOSIS — D509 Iron deficiency anemia, unspecified: Secondary | ICD-10-CM | POA: Insufficient documentation

## 2020-02-28 ENCOUNTER — Telehealth: Payer: Self-pay | Admitting: Internal Medicine

## 2020-02-28 NOTE — Telephone Encounter (Signed)
02/28/2020  Called and confirmed r/s appt from 9/10 to 9/24 for venofer infusion with Pt.  SRW

## 2020-03-01 LAB — BASIC METABOLIC PANEL
BUN/Creatinine Ratio: 21 (ref 12–28)
BUN: 21 mg/dL (ref 8–27)
CO2: 24 mmol/L (ref 20–29)
Calcium: 9.4 mg/dL (ref 8.7–10.3)
Chloride: 104 mmol/L (ref 96–106)
Creatinine, Ser: 0.98 mg/dL (ref 0.57–1.00)
GFR calc Af Amer: 73 mL/min/{1.73_m2} (ref >59)
GFR calc non Af Amer: 63 mL/min/{1.73_m2} (ref >59)
Glucose: 85 mg/dL (ref 65–99)
Potassium: 4.1 mmol/L (ref 3.5–5.2)
Sodium: 142 mmol/L (ref 134–144)

## 2020-03-02 ENCOUNTER — Other Ambulatory Visit: Payer: Self-pay

## 2020-03-02 ENCOUNTER — Ambulatory Visit: Payer: Managed Care, Other (non HMO) | Admitting: Gastroenterology

## 2020-03-02 ENCOUNTER — Encounter: Payer: Self-pay | Admitting: Gastroenterology

## 2020-03-02 ENCOUNTER — Encounter: Payer: Self-pay | Admitting: Family

## 2020-03-02 VITALS — BP 152/85 | HR 80 | Temp 97.6°F

## 2020-03-02 DIAGNOSIS — R195 Other fecal abnormalities: Secondary | ICD-10-CM

## 2020-03-02 DIAGNOSIS — D509 Iron deficiency anemia, unspecified: Secondary | ICD-10-CM | POA: Diagnosis not present

## 2020-03-02 MED ORDER — NA SULFATE-K SULFATE-MG SULF 17.5-3.13-1.6 GM/177ML PO SOLN: 354.0000 mL | Freq: Once | ORAL | 0 refills | Status: AC

## 2020-03-02 NOTE — Progress Notes (Signed)
Anna Repress, MD [address]  [address]  [place], [place] [address]  Main: [phone]  Fax: [phone]    Gastroenterology Consultation  Referring Provider:     Dale Rye, MD Primary Care Physician:  Anna Grana, FNP Primary Gastroenterologist:  Dr. Arlyss Mcgee Reason for Consultation:     Iron deficiency anemia, stool occult positive        HPI:   Anna Mcgee is a 59 y.o. female referred by Dr. Allegra Grana, FNP  for consultation & management of iron deficiency anemia.  Patient underwent Roux-en-Y gastric bypass in 2006, without any complications.  Patient is diagnosed with severe iron deficiency anemia based on labs he had PCP in 11/2019.  Her ferritin was 10, hemoglobin has been ranging between 10 and 11 for last several years.  She reported severe fatigue.  Subsequently, she was referred to Dr. Donneta Mcgee, she received parenteral iron therapy with improvement in ferritin levels and anemia.  Her B12 and folate levels were normal.  She denies any GI symptoms.  She reports that her fatigue has significantly improved.  Patient vapes occasionally She does not drink alcohol She works as a Water quality scientist at [employer]  NSAIDs: None  Antiplts/Anticoagulants/Anti thrombotics: None  GI Procedures: None She denies family history of GI malignancy  Past Medical History:  Diagnosis Date  . Heart murmur   . History of anemia   . Hypertension   . Postmenopausal bleeding   . Vitamin D deficiency   . Vitamin D deficiency     Past Surgical History:  Procedure Laterality Date  . GASTRIC BYPASS  2006    Current Outpatient Medications:  .  buPROPion (WELLBUTRIN XL) 300 MG 24 hr tablet, Take 1 tablet (300 mg total) by mouth every morning., Disp: 90 tablet, Rfl: 0 .  loratadine (CLARITIN) 10 MG tablet, Take 1 tablet (10 mg total) by mouth daily., Disp: 30 tablet, Rfl: 2 .  losartan (COZAAR) 50 MG tablet, Take 1 tablet (50 mg total) by mouth daily.,  Disp: 90 tablet, Rfl: 3 .  triamcinolone cream (KENALOG) 0.1 %, Apply 1 application topically 2 (two) times daily., Disp: 30 g, Rfl: 0 .  Na Sulfate-K Sulfate-Mg Sulf 17.5-3.13-1.6 GM/177ML SOLN, Take 354 mLs by mouth once for 1 dose., Disp: 354 mL, Rfl: 0   Family History  Problem Relation Age of Onset  . Hypertension Mother   . Hypertension Father   . Arthritis Maternal Grandmother   . Colon cancer Neg Hx      Social History   Tobacco Use  . Smoking status: Former Smoker    Types: Cigarettes    Quit date: 08/01/2010    Years since quitting: 9.5  . Smokeless tobacco: Never Used  . Tobacco comment: Vapes  Vaping Use  . Vaping Use: Some days  Substance Use Topics  . Alcohol use: Yes    Alcohol/week: 0.0 standard drinks    Comment: Rare  . Drug use: No    Allergies as of 03/02/2020 - Review Complete 03/02/2020  Allergen Reaction Noted  . Amlodipine  01/23/2020  . Penicillins  12/07/2013    Review of Systems:    All systems reviewed and negative except where noted in HPI.   Physical Exam:  BP (!) 152/85 (BP Location: Left Arm, Patient Position: Sitting, Cuff Size: Normal)   Pulse 80   Temp 97.6 F (36.4 C) (Oral)  No LMP recorded. Patient is postmenopausal.  General:   Alert,  Well-developed,  well-nourished, pleasant and cooperative in NAD Head:  Normocephalic and atraumatic. Eyes:  Sclera clear, no icterus.   Conjunctiva pink. Ears:  Normal auditory acuity. Nose:  No deformity, discharge, or lesions. Mouth:  No deformity or lesions,oropharynx pink & moist. Neck:  Supple; no masses or thyromegaly. Lungs:  Respirations even and unlabored.  Clear throughout to auscultation.   No wheezes, crackles, or rhonchi. No acute distress. Heart:  Regular rate and rhythm; no murmurs, clicks, rubs, or gallops. Abdomen:  Normal bowel sounds. Soft, non-tender and non-distended without masses, hepatosplenomegaly or hernias noted.  No guarding or rebound tenderness.   Rectal: Not  performed Msk:  Symmetrical without gross deformities. Good, equal movement & strength bilaterally. Pulses:  Normal pulses noted. Extremities:  No clubbing or edema.  No cyanosis. Neurologic:  Alert and oriented x3;  grossly normal neurologically. Skin:  Intact without significant lesions or rashes. No jaundice. Psych:  Alert and cooperative. Normal mood and affect.  Imaging Studies: None  Assessment and Plan:   Anna Mcgee is a 59 y.o. female with history of Roux-en-Y gastric bypass in 2006, with chronic iron deficiency anemia, fecal occult blood positive  Iron deficiency anemia: responded to parenteral iron therapy Recommend EGD and colonoscopy for further evaluation Recommend to take bariatric multivitamin with minerals 1pill daily   Follow up based on above work-up   Anna Repress, MD

## 2020-03-03 NOTE — Progress Notes (Signed)
FYI.  Looks like this is your pt.   

## 2020-03-08 ENCOUNTER — Telehealth: Payer: Self-pay | Admitting: Gastroenterology

## 2020-03-08 NOTE — Telephone Encounter (Signed)
Called and left a message for call back  

## 2020-03-08 NOTE — Telephone Encounter (Signed)
Patient needs to reschedule her procedure from 9.30.21 to possibly 10.14.21. Please call pt back to resch. Pt had ov on 9.17.21.

## 2020-03-08 NOTE — Telephone Encounter (Signed)
Called and left a message for call back to rescheduled the appointment

## 2020-03-09 ENCOUNTER — Other Ambulatory Visit: Payer: Self-pay

## 2020-03-09 ENCOUNTER — Inpatient Hospital Stay: Payer: Managed Care, Other (non HMO)

## 2020-03-09 VITALS — BP 129/68 | HR 70 | Temp 97.4°F | Resp 18

## 2020-03-09 DIAGNOSIS — D509 Iron deficiency anemia, unspecified: Secondary | ICD-10-CM | POA: Diagnosis present

## 2020-03-09 DIAGNOSIS — E611 Iron deficiency: Secondary | ICD-10-CM

## 2020-03-09 MED ORDER — SODIUM CHLORIDE 0.9 % IV SOLN
Freq: Once | INTRAVENOUS | Status: AC
  Filled 2020-03-09: qty 250

## 2020-03-09 MED ORDER — IRON SUCROSE 20 MG/ML IV SOLN
200.0000 mg | Freq: Once | INTRAVENOUS | Status: AC
  Administered 2020-03-09: 200 mg via INTRAVENOUS
  Filled 2020-03-09: qty 10

## 2020-03-09 MED ORDER — SODIUM CHLORIDE 0.9 % IV SOLN: 200.0000 mg | Freq: Once | INTRAVENOUS | Status: DC

## 2020-03-09 NOTE — Telephone Encounter (Signed)
Called and left a message for call back  

## 2020-03-12 NOTE — Telephone Encounter (Signed)
Called and left a message for call back  

## 2020-03-13 ENCOUNTER — Other Ambulatory Visit: Payer: Managed Care, Other (non HMO)

## 2020-03-15 ENCOUNTER — Ambulatory Visit
Admission: RE | Admit: 2020-03-15 | Payer: Managed Care, Other (non HMO) | Source: Home / Self Care | Admitting: Gastroenterology

## 2020-03-15 ENCOUNTER — Encounter: Admission: RE | Payer: Self-pay | Source: Home / Self Care

## 2020-03-15 SURGERY — COLONOSCOPY WITH PROPOFOL
Anesthesia: Choice

## 2020-03-16 ENCOUNTER — Telehealth: Payer: Self-pay | Admitting: Family

## 2020-03-16 NOTE — Telephone Encounter (Signed)
Rejection Reason - Patient was No Show" [facility] said on Mar 15, 2020 5:14 PM

## 2020-03-23 ENCOUNTER — Telehealth: Payer: Self-pay

## 2020-03-23 NOTE — Telephone Encounter (Signed)
Patient contacted the office to see what dates Dr. Allegra Lai was available to do her colonoscopy.  I provided her with the following Mondays at [facility]: Nov 1, 8th, 15th.  She has been asked to call or send a mychart message to confirm which one will work.  She will need a new rx sent to the pharmacy for her bowel prep upon confirming her date.  Thanks,  [place], [place]

## 2020-03-27 ENCOUNTER — Other Ambulatory Visit: Payer: Self-pay

## 2020-03-27 ENCOUNTER — Telehealth: Payer: Self-pay

## 2020-03-27 DIAGNOSIS — R195 Other fecal abnormalities: Secondary | ICD-10-CM

## 2020-03-27 DIAGNOSIS — D509 Iron deficiency anemia, unspecified: Secondary | ICD-10-CM

## 2020-03-27 NOTE — Telephone Encounter (Signed)
Colonoscopy with EGD has been scheduled for patient with Dr. Allegra Lai on 04/23/20.  Reviewed instructions for procedure.  Instructions sent via mychart and mailed.  Referral updated.  Patient will call the office when she is ready for her rx to be sent to the pharmacy.  She did not want it sent early.  Thanks,  [place], [place]

## 2020-03-27 NOTE — Telephone Encounter (Signed)
-----   Message from Wilnette [place], [place] sent at 03/27/2020 10:24 AM EDT ----- Regarding: Returning call [facility],  Patient would like to schedule her procedure the date you provided to her early, which was 11/08. Can you reach out to her to schedule this?    Thanks, [place]

## 2020-03-30 ENCOUNTER — Telehealth: Payer: Self-pay

## 2020-03-30 NOTE — Telephone Encounter (Signed)
Any way to remove this for patient. It has been a good while now since now since this scheduled visit.

## 2020-03-30 NOTE — Telephone Encounter (Signed)
Pt said she called on 01/25/20 and couldn't make the appointment so she had conversations with Claris Che so she shouldn't be charged a no show fee for this date. She would like someone to get this removed?

## 2020-04-04 ENCOUNTER — Encounter: Payer: Self-pay | Admitting: Family

## 2020-04-04 ENCOUNTER — Other Ambulatory Visit: Payer: Self-pay

## 2020-04-04 ENCOUNTER — Ambulatory Visit: Payer: Managed Care, Other (non HMO) | Admitting: Family

## 2020-04-04 DIAGNOSIS — I10 Essential (primary) hypertension: Secondary | ICD-10-CM

## 2020-04-04 DIAGNOSIS — E611 Iron deficiency: Secondary | ICD-10-CM | POA: Diagnosis not present

## 2020-04-04 DIAGNOSIS — F339 Major depressive disorder, recurrent, unspecified: Secondary | ICD-10-CM

## 2020-04-04 MED ORDER — LOSARTAN POTASSIUM 50 MG PO TABS: 50.0000 mg | ORAL_TABLET | Freq: Every day | ORAL | 3 refills | Status: DC

## 2020-04-04 MED ORDER — BUPROPION HCL ER (XL) 300 MG PO TB24: 300.0000 mg | ORAL_TABLET | Freq: Every morning | ORAL | 0 refills | Status: DC

## 2020-04-04 NOTE — Patient Instructions (Addendum)
Start losartan at evening/bedtime and monitor blood pressure.   Monitor blood pressure at home and me 5-6 reading on separate days. Goal is less than 120/80, based on newest guidelines, however we certainly want to be less than 130/80;  if persistently higher, please make sooner follow up appointment so we can recheck you blood pressure and manage/ adjust medications.   Please remember to schedule mammogram as discussed :)

## 2020-04-04 NOTE — Telephone Encounter (Signed)
Her information has been sent to charge correction.

## 2020-04-04 NOTE — Assessment & Plan Note (Signed)
Slightly elevated. Advised to start taking losartan 50mg  qhs and monitor BP at home.

## 2020-04-04 NOTE — Progress Notes (Signed)
Subjective:    Patient ID: Anna Mcgee, female    DOB: 1960-12-22, 59 y.o.   MRN: [id number]  CC: Anna Mcgee is a 59 y.o. female who presents today for follow up.   HPI: Feels well today No concerns  HTN- compliant with losartan and takes in the morning. No cp, sob  Anemia-colonoscopy planned 04/2020 with dr Anna Mcgee Following with Dr Anna Mcgee for parental iron therapy, last infusion 03/09/20. Improvement of hemoglobin as of 02/08/20 Follow up with Dr Racheal Patches 05/18/20  Due mammogram, plans to schedule.  Former smoker   HISTORY:  Past Medical History:  Diagnosis Date  . Heart murmur   . History of anemia   . Hypertension   . Postmenopausal bleeding   . Vitamin D deficiency   . Vitamin D deficiency    Past Surgical History:  Procedure Laterality Date  . GASTRIC BYPASS  2006   Family History  Problem Relation Age of Onset  . Hypertension Mother   . Hypertension Father   . Arthritis Maternal Grandmother   . Colon cancer Neg Hx     Allergies: Amlodipine and Penicillins Current Outpatient Medications on File Prior to Visit  Medication Sig Dispense Refill  . loratadine (CLARITIN) 10 MG tablet Take 1 tablet (10 mg total) by mouth daily. 30 tablet 2  . triamcinolone cream (KENALOG) 0.1 % Apply 1 application topically 2 (two) times daily. 30 g 0   No current facility-administered medications on file prior to visit.    Social History   Tobacco Use  . Smoking status: Former Smoker    Types: Cigarettes    Quit date: 08/01/2010    Years since quitting: 9.6  . Smokeless tobacco: Never Used  . Tobacco comment: Vapes  Vaping Use  . Vaping Use: Some days  Substance Use Topics  . Alcohol use: Yes    Alcohol/week: 0.0 standard drinks    Comment: Rare  . Drug use: No    Review of Systems  Constitutional: Negative for chills and fever.  Respiratory: Negative for cough and shortness of breath.   Cardiovascular: Negative for chest pain and palpitations.    Gastrointestinal: Negative for nausea and vomiting.      Objective:    BP (!) 144/98 (BP Location: Left Arm, Cuff Size: Large)   Pulse 79   Temp 97.9 F (36.6 C)   Ht 5\' 6"  (1.676 m)   Wt 215 lb (97.5 kg)   SpO2 99%   BMI 34.70 kg/m  BP Readings from Last 3 Encounters:  04/04/20 (!) 144/98  03/09/20 129/68  03/02/20 (!) 152/85   Wt Readings from Last 3 Encounters:  04/04/20 215 lb (97.5 kg)  02/14/20 214 lb (97.1 kg)  02/10/20 209 lb (94.8 kg)    Physical Exam Vitals reviewed.  Constitutional:      Appearance: She is well-developed.  Eyes:     Conjunctiva/sclera: Conjunctivae normal.  Cardiovascular:     Rate and Rhythm: Normal rate and regular rhythm.     Pulses: Normal pulses.     Heart sounds: Normal heart sounds.  Pulmonary:     Effort: Pulmonary effort is normal.     Breath sounds: Normal breath sounds. No wheezing, rhonchi or rales.  Skin:    General: Skin is warm and dry.  Neurological:     Mental Status: She is alert.  Psychiatric:        Speech: Speech normal.        Behavior: Behavior normal.  Thought Content: Thought content normal.        Assessment & Plan:   Problem List Items Addressed This Visit      Cardiovascular and Mediastinum   Hypertension    Slightly elevated. Advised to start taking losartan 50mg  qhs and monitor BP at home.         Other   Iron deficiency    Improved. Colonscopy scheduled for 05/2020. She has follow up with Dr 06/2020, will follow.          I am having Anna Mcgee maintain her triamcinolone cream and loratadine.   No orders of the defined types were placed in this encounter.   Return precautions given.   Risks, benefits, and alternatives of the medications and treatment plan prescribed today were discussed, and patient expressed understanding.   Education regarding symptom management and diagnosis given to patient on AVS.  Continue to follow with Anna Romberg, FNP for routine  health maintenance.   Anna Grana and I agreed with plan.   Anna Rainier, FNP

## 2020-04-04 NOTE — Assessment & Plan Note (Addendum)
Improved. Colonscopy scheduled for 05/2020. She has follow up with Dr Donneta Romberg, will follow.

## 2020-04-05 ENCOUNTER — Telehealth: Payer: Self-pay | Admitting: Family

## 2020-04-05 NOTE — Progress Notes (Signed)
Left detailed message for patient to call back & let us know if she would like to participate in [place] annual lung screening program. °

## 2020-04-05 NOTE — Telephone Encounter (Signed)
Patient was returning call for results 

## 2020-04-05 NOTE — Telephone Encounter (Signed)
Left detailed message for patient to call back & let us know if she would like to participate in CT annual lung screening program.

## 2020-04-05 NOTE — Progress Notes (Signed)
LMTCB

## 2020-04-17 ENCOUNTER — Telehealth: Payer: Self-pay

## 2020-04-17 NOTE — Telephone Encounter (Signed)
-----   Message from Wilnette Kales, [place] sent at 04/17/2020 10:41 AM EDT ----- Regarding: Patient call Marcelino Duster,  Patient called and said she has questions and the coupon was not included in the instruction package. Can you reach out to her [phone].    -Jovon

## 2020-04-17 NOTE — Telephone Encounter (Signed)
Returned patients call.  LVM for her to call me back.  I will provide her with a ClenPiq bowel prep for her colonoscopy.  Thanks,  Pepper Pike, [place]

## 2020-04-19 ENCOUNTER — Other Ambulatory Visit: Payer: Self-pay

## 2020-04-19 ENCOUNTER — Other Ambulatory Visit
Admission: RE | Admit: 2020-04-19 | Discharge: 2020-04-19 | Disposition: A | Discharge status: Home / Self Care | Payer: Managed Care, Other (non HMO) | Source: Ambulatory Visit | Attending: Gastroenterology | Admitting: Gastroenterology

## 2020-04-19 ENCOUNTER — Encounter: Payer: Self-pay | Admitting: Internal Medicine

## 2020-04-19 ENCOUNTER — Telehealth (INDEPENDENT_AMBULATORY_CARE_PROVIDER_SITE_OTHER): Payer: Managed Care, Other (non HMO) | Admitting: Internal Medicine

## 2020-04-19 VITALS — Ht 66.0 in | Wt 215.0 lb

## 2020-04-19 DIAGNOSIS — J309 Allergic rhinitis, unspecified: Secondary | ICD-10-CM | POA: Diagnosis not present

## 2020-04-19 DIAGNOSIS — J329 Chronic sinusitis, unspecified: Secondary | ICD-10-CM

## 2020-04-19 DIAGNOSIS — R0982 Postnasal drip: Secondary | ICD-10-CM

## 2020-04-19 DIAGNOSIS — Z01818 Encounter for other preprocedural examination: Secondary | ICD-10-CM | POA: Insufficient documentation

## 2020-04-19 DIAGNOSIS — Z20822 Contact with and (suspected) exposure to covid-19: Secondary | ICD-10-CM | POA: Diagnosis not present

## 2020-04-19 MED ORDER — DOXYCYCLINE HYCLATE 100 MG PO TABS: 100.0000 mg | ORAL_TABLET | Freq: Two times a day (BID) | ORAL | 0 refills | Status: DC | PRN

## 2020-04-19 NOTE — Progress Notes (Signed)
Symptoms include headache, thirsty, fatigue, nasal congestion and pressure, drainage, sore throat.   Onset of Monday, no one around the Patient is sick. Patient will be tested today for COVID.

## 2020-04-19 NOTE — Progress Notes (Signed)
Telephone Note  I connected with Anna Mcgee  on 04/19/20 at 10:00 AM EDT by telephone and verified that I am speaking with the correct person using two identifiers.  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 The patient expressed understanding and agreed to proceed.   HPI: Sick visit sxs started Monday with h/a, congestion and pain sinus pressure , fatigue (energy improved), subjective fever and ears felt hot. Tried Tylenol Q4 hours and this helped    ROS: See pertinent positives and negatives per HPI.  Past Medical History:  Diagnosis Date  . Heart murmur   . History of anemia   . Hypertension   . Postmenopausal bleeding   . Vitamin D deficiency   . Vitamin D deficiency     Past Surgical History:  Procedure Laterality Date  . GASTRIC BYPASS  2006     Current Outpatient Medications:  .  buPROPion (WELLBUTRIN XL) 300 MG 24 hr tablet, Take 1 tablet (300 mg total) by mouth every morning., Disp: 90 tablet, Rfl: 0 .  loratadine (CLARITIN) 10 MG tablet, Take 1 tablet (10 mg total) by mouth daily., Disp: 30 tablet, Rfl: 2 .  losartan (COZAAR) 50 MG tablet, Take 1 tablet (50 mg total) by mouth daily., Disp: 90 tablet, Rfl: 3 .  doxycycline (VIBRA-TABS) 100 MG tablet, Take 1 tablet (100 mg total) by mouth 2 (two) times daily as needed. With food, Disp: 14 tablet, Rfl: 0 .  triamcinolone cream (KENALOG) 0.1 %, Apply 1 application topically 2 (two) times daily. (Patient not taking: Reported on 04/19/2020), Disp: 30 g, Rfl: 0  EXAM:  VITALS per patient if applicable:  GENERAL: alert, oriented, appears well and in no acute distress  PSYCH/NEURO: pleasant and cooperative, no obvious depression or anxiety, speech and thought processing grossly intact  ASSESSMENT AND PLAN:  Discussed the  following assessment and plan:  Sinusitis, unspecified chronicity, unspecified location Allergic rhinitis  Post nasal drip - Plan: doxycycline (VIBRA-TABS) 100 MG tablet bid  Prn claritin  Mucinex DM  Warm salt gargles  Mvt  Warm tea with honey and lemon  Cough drops  Cont fluids  Nasal saline and flonase and claritin  covid test today prep for colonoscopy Monday  Consider flu shot strep test in future call back wants to wait for now and if not feeling better will order  Consider flu shot in 2 weeks when feeling better   -we discussed possible serious and likely etiologies, options for evaluation and workup, limitations of telemedicine visit vs in person visit, treatment, treatment risks and precautions.     I discussed the assessment and treatment plan with the patient. The patient was provided an opportunity to ask questions and all were answered. The patient agreed with the plan and demonstrated an understanding of the instructions.    Time spent 20 minutes  Bevelyn Buckles, MD

## 2020-04-20 LAB — SARS CORONAVIRUS 2 (TAT 6-24 HRS): SARS Coronavirus 2: NEGATIVE

## 2020-04-24 ENCOUNTER — Telehealth: Payer: Self-pay

## 2020-04-24 NOTE — Telephone Encounter (Signed)
Patient call back stated that no one had called her back advised her that someone will call that they received the message

## 2020-04-24 NOTE — Telephone Encounter (Signed)
[facility] Night - Cl TELEPHONE ADVICE RECORD [facility] Patient Name: ENISA RUNYAN Gender: Female DOB: [birth year] Age: 59 Y 3 D Return Phone Number: [phone] (Primary) Address: City/State/Zip: [place] [address] Client [facility] - Cl Client Site [facility] [place] [facility] - Night Physician McClean-Scocuzza, French Ana Contact Type Call Who Is Calling Patient / Member / Family / Caregiver Call Type Triage / Clinical Relationship To Patient Self Return Phone Number [phone] (Primary) Chief Complaint Medication reaction Reason for Call Symptomatic / Request for Health Information Initial Comment Caller had a virtual appt on [facility] and was prescribed an abx she had never taken before. She had been asked if abx gave her yeast infections and caller said no. She took the new abx all weekend and it cleared her sinus sxs but it gave her diarrhea all weekend (still urinating) and now has inflamation/tenderness between vagina and rectum. Stopped taking abx. Has a colonoscopy next week/is concerned. Additional Comment Tyanne is at work. If she doesn't answer callback she asks RN to leave message and she will callback. Translation No Disp. Time Lamount Cohen Time) Disposition Final User 04/24/2020 8:02:27 AM Attempt made - message left Reed Pandy, RN, Amy 04/24/2020 8:02:56 AM Attempt made - message left Reed Pandy, RN, Amy 04/24/2020 8:15:12 AM Attempt made - no message left Reed Pandy, RN, Amy 04/24/2020 8:22:21 AM FINAL ATTEMPT MADE - no message left

## 2020-04-24 NOTE — Telephone Encounter (Signed)
"  Initial Comment Caller had a virtual appt on [facility] and was prescribed an abx she had never taken before. She had been asked if abx gave her yeast infections and caller said no. She took the new abx all weekend and it cleared her sinus sxs but it gave her diarrhea all weekend (still urinating) and now has inflamation/tenderness between vagina and rectum. Stopped taking abx. Has a colonoscopy next week/is concerned. Additional Comment Anna Mcgee is at work. If she doesn't answer callback she asks RN to leave message and she will Callback."  Please advise

## 2020-04-24 NOTE — Telephone Encounter (Signed)
Is she having itching or irritation at times antibiotics can cause yeast in the private area Does she want to try a yeast pill?  If having diarrhea and caused irritation of skin can try with gloves vasoline with diaper rash cream +clotrimazole antifungal creams

## 2020-04-26 ENCOUNTER — Telehealth: Payer: Self-pay | Admitting: Family

## 2020-04-26 NOTE — Telephone Encounter (Signed)
Patient stated that she has no burning, irritation, or yeast. She said that literally when she wipes she feels that the perineum area is swollen. She has no pain & it does not bother her to sit. She just felt it when she wiped.

## 2020-04-26 NOTE — Telephone Encounter (Signed)
Patient stated that would be willing to try pill for yeast. I am assuming diflucan? I told her I would let her know when this was sent. She is still  unsure what is going on. She wanted to know if this persists do you think it is okay that she has her colonoscopy that is scheduled for next Thursday.

## 2020-04-26 NOTE — Telephone Encounter (Signed)
She can try vasoline to soothe  Does she want to try a yeast pill as well?  Make sure not using new soaps down there as well

## 2020-04-26 NOTE — Telephone Encounter (Signed)
Patient was returning call about burning

## 2020-04-26 NOTE — Telephone Encounter (Signed)
LMTCB

## 2020-04-26 NOTE — Telephone Encounter (Signed)
I called patient & she stated that she needed to call me back. She will call when she is able.

## 2020-04-26 NOTE — Telephone Encounter (Signed)
Patient called about note below. Patient is at work, please leave a detailed message on [phone].

## 2020-04-27 ENCOUNTER — Other Ambulatory Visit: Payer: Self-pay | Admitting: Internal Medicine

## 2020-04-27 DIAGNOSIS — B3731 Acute candidiasis of vulva and vagina: Secondary | ICD-10-CM

## 2020-04-27 MED ORDER — FLUCONAZOLE 150 MG PO TABS: 150.0000 mg | ORAL_TABLET | Freq: Once | ORAL | 0 refills | Status: AC

## 2020-04-27 NOTE — Telephone Encounter (Signed)
Yes ok for colonoscopy to take diflucan

## 2020-04-27 NOTE — Telephone Encounter (Signed)
I called patient & she was informed that okay to have colonoscopy. Also let her know diflucan was sent to pharmacy.

## 2020-04-30 ENCOUNTER — Other Ambulatory Visit: Payer: Self-pay

## 2020-04-30 ENCOUNTER — Other Ambulatory Visit
Admission: RE | Admit: 2020-04-30 | Discharge: 2020-04-30 | Disposition: A | Discharge status: Home / Self Care | Payer: Managed Care, Other (non HMO) | Source: Ambulatory Visit | Attending: Gastroenterology | Admitting: Gastroenterology

## 2020-04-30 DIAGNOSIS — Z01812 Encounter for preprocedural laboratory examination: Secondary | ICD-10-CM | POA: Insufficient documentation

## 2020-04-30 DIAGNOSIS — Z20822 Contact with and (suspected) exposure to covid-19: Secondary | ICD-10-CM | POA: Insufficient documentation

## 2020-05-01 ENCOUNTER — Encounter: Payer: Self-pay | Admitting: Gastroenterology

## 2020-05-01 ENCOUNTER — Other Ambulatory Visit: Payer: Self-pay

## 2020-05-01 LAB — SARS CORONAVIRUS 2 (TAT 6-24 HRS): SARS Coronavirus 2: NEGATIVE

## 2020-05-02 NOTE — Discharge Instructions (Signed)
General Anesthesia, Adult, Care After This sheet gives you information about how to care for yourself after your procedure. Your health care provider may also give you more specific instructions. If you have problems or questions, contact your health care provider. What can I expect after the procedure? After the procedure, the following side effects are common:  Pain or discomfort at the IV site.  Nausea.  Vomiting.  Sore throat.  Trouble concentrating.  Feeling cold or chills.  Weak or tired.  Sleepiness and fatigue.  Soreness and body aches. These side effects can affect parts of the body that were not involved in surgery. Follow these instructions at home:  For at least 24 hours after the procedure:  Have a responsible adult stay with you. It is important to have someone help care for you until you are awake and alert.  Rest as needed.  Do not: ? Participate in activities in which you could fall or become injured. ? Drive. ? Use heavy machinery. ? Drink alcohol. ? Take sleeping pills or medicines that cause drowsiness. ? Make important decisions or sign legal documents. ? Take care of children on your own. Eating and drinking  Follow any instructions from your health care provider about eating or drinking restrictions.  When you feel hungry, start by eating small amounts of foods that are soft and easy to digest (bland), such as toast. Gradually return to your regular diet.  Drink enough fluid to keep your urine pale yellow.  If you vomit, rehydrate by drinking water, juice, or clear broth. General instructions  If you have sleep apnea, surgery and certain medicines can increase your risk for breathing problems. Follow instructions from your health care provider about wearing your sleep device: ? Anytime you are sleeping, including during daytime naps. ? While taking prescription pain medicines, sleeping medicines, or medicines that make you drowsy.  Return to  your normal activities as told by your health care provider. Ask your health care provider what activities are safe for you.  Take over-the-counter and prescription medicines only as told by your health care provider.  If you smoke, do not smoke without supervision.  Keep all follow-up visits as told by your health care provider. This is important. Contact a health care provider if:  You have nausea or vomiting that does not get better with medicine.  You cannot eat or drink without vomiting.  You have pain that does not get better with medicine.  You are unable to pass urine.  You develop a skin rash.  You have a fever.  You have redness around your IV site that gets worse. Get help right away if:  You have difficulty breathing.  You have chest pain.  You have blood in your urine or stool, or you vomit blood. Summary  After the procedure, it is common to have a sore throat or nausea. It is also common to feel tired.  Have a responsible adult stay with you for the first 24 hours after general anesthesia. It is important to have someone help care for you until you are awake and alert.  When you feel hungry, start by eating small amounts of foods that are soft and easy to digest (bland), such as toast. Gradually return to your regular diet.  Drink enough fluid to keep your urine pale yellow.  Return to your normal activities as told by your health care provider. Ask your health care provider what activities are safe for you. This information is not   intended to replace advice given to you by your health care provider. Make sure you discuss any questions you have with your health care provider. Document Revised: 06/05/2017 Document Reviewed: 01/16/2017 Elsevier Patient Education  2020 Elsevier Inc.  

## 2020-05-03 ENCOUNTER — Other Ambulatory Visit: Payer: Self-pay

## 2020-05-03 ENCOUNTER — Ambulatory Visit
Admission: RE | Admit: 2020-05-03 | Discharge: 2020-05-03 | Disposition: A | Discharge status: Home / Self Care | Payer: Managed Care, Other (non HMO) | Attending: Gastroenterology | Admitting: Gastroenterology

## 2020-05-03 ENCOUNTER — Encounter
Admission: RE | Disposition: A | Discharge status: Home / Self Care | Payer: Self-pay | Source: Home / Self Care | Attending: Gastroenterology

## 2020-05-03 ENCOUNTER — Ambulatory Visit: Payer: Managed Care, Other (non HMO) | Admitting: Anesthesiology

## 2020-05-03 ENCOUNTER — Encounter: Payer: Self-pay | Admitting: Gastroenterology

## 2020-05-03 DIAGNOSIS — Z980 Intestinal bypass and anastomosis status: Secondary | ICD-10-CM | POA: Diagnosis not present

## 2020-05-03 DIAGNOSIS — D509 Iron deficiency anemia, unspecified: Secondary | ICD-10-CM | POA: Diagnosis present

## 2020-05-03 DIAGNOSIS — Z87891 Personal history of nicotine dependence: Secondary | ICD-10-CM | POA: Diagnosis not present

## 2020-05-03 DIAGNOSIS — R195 Other fecal abnormalities: Secondary | ICD-10-CM

## 2020-05-03 DIAGNOSIS — Z880 Allergy status to penicillin: Secondary | ICD-10-CM | POA: Insufficient documentation

## 2020-05-03 DIAGNOSIS — Z79899 Other long term (current) drug therapy: Secondary | ICD-10-CM | POA: Insufficient documentation

## 2020-05-03 DIAGNOSIS — Z888 Allergy status to other drugs, medicaments and biological substances status: Secondary | ICD-10-CM | POA: Diagnosis not present

## 2020-05-03 DIAGNOSIS — Z9884 Bariatric surgery status: Secondary | ICD-10-CM | POA: Diagnosis not present

## 2020-05-03 HISTORY — PX: COLONOSCOPY WITH PROPOFOL: SHX5780

## 2020-05-03 HISTORY — PX: ESOPHAGOGASTRODUODENOSCOPY (EGD) WITH PROPOFOL: SHX5813

## 2020-05-03 HISTORY — DX: Presence of dental prosthetic device (complete) (partial): Z97.2

## 2020-05-03 SURGERY — COLONOSCOPY WITH PROPOFOL
Anesthesia: General | Site: Rectum

## 2020-05-03 MED ORDER — STERILE WATER FOR IRRIGATION IR SOLN: Status: DC | PRN

## 2020-05-03 MED ORDER — SODIUM CHLORIDE 0.9 % IV SOLN: INTRAVENOUS | Status: DC

## 2020-05-03 MED ORDER — PROPOFOL 10 MG/ML IV BOLUS
INTRAVENOUS | Status: DC | PRN
  Administered 2020-05-03 (×2): 30 mg via INTRAVENOUS
  Administered 2020-05-03: 70 mg via INTRAVENOUS
  Administered 2020-05-03 (×2): 30 mg via INTRAVENOUS
  Administered 2020-05-03: 50 mg via INTRAVENOUS

## 2020-05-03 MED ORDER — LACTATED RINGERS IV SOLN: INTRAVENOUS | Status: DC

## 2020-05-03 MED ORDER — ACETAMINOPHEN 325 MG PO TABS: 325.0000 mg | ORAL_TABLET | ORAL | Status: DC | PRN

## 2020-05-03 MED ORDER — ACETAMINOPHEN 160 MG/5ML PO SOLN: 325.0000 mg | ORAL | Status: DC | PRN

## 2020-05-03 MED ORDER — GLYCOPYRROLATE 0.2 MG/ML IJ SOLN
INTRAMUSCULAR | Status: DC | PRN
  Administered 2020-05-03: .1 mg via INTRAVENOUS

## 2020-05-03 MED ORDER — LIDOCAINE HCL (CARDIAC) PF 100 MG/5ML IV SOSY
PREFILLED_SYRINGE | INTRAVENOUS | Status: DC | PRN
  Administered 2020-05-03: 50 mg via INTRAVENOUS

## 2020-05-03 SURGICAL SUPPLY — 8 items
BLOCK BITE 60FR ADLT L/F GRN (MISCELLANEOUS) ×4 IMPLANT
FORCEPS BIOP RAD 4 LRG CAP 4 (CUTTING FORCEPS) ×4 IMPLANT
GOWN CVR UNV OPN BCK APRN NK (MISCELLANEOUS) ×4 IMPLANT
GOWN ISOL THUMB LOOP REG UNIV (MISCELLANEOUS) ×8
KIT PRC NS LF DISP ENDO (KITS) ×2 IMPLANT
KIT PROCEDURE OLYMPUS (KITS) ×4
MANIFOLD NEPTUNE II (INSTRUMENTS) ×4 IMPLANT
WATER STERILE IRR 250ML POUR (IV SOLUTION) ×4 IMPLANT

## 2020-05-03 NOTE — H&P (Signed)
Arlyss Repress, MD [address]  [address]  [place], [place] [address]  Main: [phone]  Fax: [phone] Pager: [phone]  Primary Care Physician:  Allegra Grana, FNP Primary Gastroenterologist:  Dr. Arlyss Repress  Pre-Procedure History & Physical: HPI:  Anna Mcgee is a 59 y.o. female is here for an endoscopy and colonoscopy.   Past Medical History:  Diagnosis Date  . Heart murmur   . History of anemia   . Hypertension   . Postmenopausal bleeding   . Vitamin D deficiency   . Vitamin D deficiency   . Wears dentures    partial lower    Past Surgical History:  Procedure Laterality Date  . GASTRIC BYPASS  2006    Prior to Admission medications   Medication Sig Start Date End Date Taking? Authorizing Provider  buPROPion (WELLBUTRIN XL) 300 MG 24 hr tablet Take 1 tablet (300 mg total) by mouth every morning. 04/04/20  Yes Arnett, Lyn Records, FNP  loratadine (CLARITIN) 10 MG tablet Take 1 tablet (10 mg total) by mouth daily. 01/09/20  Yes Arnett, Lyn Records, FNP  losartan (COZAAR) 50 MG tablet Take 1 tablet (50 mg total) by mouth daily. 04/04/20  Yes Allegra Grana, FNP  triamcinolone cream (KENALOG) 0.1 % Apply 1 application topically 2 (two) times daily. Patient not taking: Reported on 04/19/2020 12/09/19   Theadore Nan, NP    Allergies as of 03/27/2020 - Review Complete 03/02/2020  Allergen Reaction Noted  . Amlodipine  01/23/2020  . Penicillins  12/07/2013    Family History  Problem Relation Age of Onset  . Hypertension Mother   . Hypertension Father   . Arthritis Maternal Grandmother   . Colon cancer Neg Hx     Social History   Socioeconomic History  . Marital status: Single    Spouse name: Not on file  . Number of children: Not on file  . Years of education: Not on file  . Highest education level: Not on file  Occupational History  . Not on file  Tobacco Use  . Smoking status: Former Smoker    Types: Cigarettes    Quit  date: 08/01/2010    Years since quitting: 9.7  . Smokeless tobacco: Never Used  . Tobacco comment: Vapes  Vaping Use  . Vaping Use: Every day  . Substances: Nicotine  . Devices: Logic  Substance and Sexual Activity  . Alcohol use: Yes    Alcohol/week: 0.0 standard drinks    Comment: Rare  . Drug use: No  . Sexual activity: Not Currently  Other Topics Concern  . Not on file  Social History Narrative   Single ; Employed at [employer] ; Children- 0 ; Caffeine- coffee 3 cups, diet soda 1, unsweet tea       Lives in [place]. Quit smoking- ~10 years; no alcohol. vapes- nicotine.    Social Determinants of Health   Financial Resource Strain:   . Difficulty of Paying Living Expenses: Not on file  Food Insecurity:   . Worried About Programme researcher, broadcasting/film/video in the Last Year: Not on file  . Ran Out of Food in the Last Year: Not on file  Transportation Needs:   . Lack of Transportation (Medical): Not on file  . Lack of Transportation (Non-Medical): Not on file  Physical Activity:   . Days of Exercise per Week: Not on file  . Minutes of Exercise per Session: Not on file  Stress:   .  Feeling of Stress : Not on file  Social Connections:   . Frequency of Communication with Friends and Family: Not on file  . Frequency of Social Gatherings with Friends and Family: Not on file  . Attends Religious Services: Not on file  . Active Member of Clubs or Organizations: Not on file  . Attends Banker Meetings: Not on file  . Marital Status: Not on file  Intimate Partner Violence:   . Fear of Current or Ex-Partner: Not on file  . Emotionally Abused: Not on file  . Physically Abused: Not on file  . Sexually Abused: Not on file    Review of Systems: See HPI, otherwise negative ROS  Physical Exam: BP (!) 154/77   Pulse (!) 107   Temp 98.4 F (36.9 C) (Temporal)   Resp 16   Ht 5\' 6"  (1.676 m)   Wt 95.3 kg   SpO2 96%   BMI 33.89 kg/m  General:   Alert,  pleasant and  cooperative in NAD Head:  Normocephalic and atraumatic. Neck:  Supple; no masses or thyromegaly. Lungs:  Clear throughout to auscultation.    Heart:  Regular rate and rhythm. Abdomen:  Soft, nontender and nondistended. Normal bowel sounds, without guarding, and without rebound.   Neurologic:  Alert and  oriented x4;  grossly normal neurologically.  Impression/Plan: is here for an endoscopy and colonoscopy to be performed for history of Roux-en-Y gastric bypass in 2006, with chronic iron deficiency anemia, fecal occult blood positive   Risks, benefits, limitations, and alternatives regarding  endoscopy and colonoscopy have been reviewed with the patient.  Questions have been answered.  All parties agreeable.   2007, MD  05/03/2020, 9:56 AM

## 2020-05-03 NOTE — Transfer of Care (Signed)
Immediate Anesthesia Transfer of Care Note  Patient: Anna Mcgee  Procedure(s) Performed: COLONOSCOPY WITH PROPOFOL (N/A Rectum) ESOPHAGOGASTRODUODENOSCOPY (EGD) WITH PROPOFOL (N/A Mouth)  Patient Location: PACU  Anesthesia Type: General  Level of Consciousness: awake, alert  and patient cooperative  Airway and Oxygen Therapy: Patient Spontanous Breathing and Patient connected to supplemental oxygen  Post-op Assessment: Post-op Vital signs reviewed, Patient's Cardiovascular Status Stable, Respiratory Function Stable, Patent Airway and No signs of Nausea or vomiting  Post-op Vital Signs: Reviewed and stable  Complications: No complications documented.

## 2020-05-03 NOTE — Anesthesia Preprocedure Evaluation (Signed)
Anesthesia Evaluation  Patient identified by MRN, date of birth, ID band Patient awake    Reviewed: Allergy & Precautions, H&P , NPO status , Patient's Chart, lab work & pertinent test results, reviewed documented beta blocker date and time   Airway Mallampati: II  TM Distance: >3 FB Neck ROM: full    Dental no notable dental hx.    Pulmonary Patient abstained from smoking., former smoker,    Pulmonary exam normal breath sounds clear to auscultation       Cardiovascular Exercise Tolerance: Good hypertension, Normal cardiovascular exam Rhythm:regular Rate:Normal     Neuro/Psych negative neurological ROS  negative psych ROS   GI/Hepatic negative GI ROS, Neg liver ROS,   Endo/Other  negative endocrine ROSHx of gastric bypass  Renal/GU negative Renal ROS  negative genitourinary   Musculoskeletal   Abdominal   Peds  Hematology negative hematology ROS (+)   Anesthesia Other Findings   Reproductive/Obstetrics negative OB ROS                             Anesthesia Physical Anesthesia Plan  ASA: II  Anesthesia Plan: General   Post-op Pain Management:    Induction:   PONV Risk Score and Plan:   Airway Management Planned:   Additional Equipment:   Intra-op Plan:   Post-operative Plan:   Informed Consent: I have reviewed the patients History and Physical, chart, labs and discussed the procedure including the risks, benefits and alternatives for the proposed anesthesia with the patient or authorized representative who has indicated his/her understanding and acceptance.     Dental Advisory Given  Plan Discussed with: CRNA  Anesthesia Plan Comments:         Anesthesia Quick Evaluation

## 2020-05-03 NOTE — Anesthesia Postprocedure Evaluation (Signed)
Anesthesia Post Note  Patient: Anna Mcgee  Procedure(s) Performed: COLONOSCOPY WITH PROPOFOL (N/A Rectum) ESOPHAGOGASTRODUODENOSCOPY (EGD) WITH PROPOFOL (N/A Mouth)     Patient location during evaluation: PACU Anesthesia Type: General Level of consciousness: awake and alert Pain management: pain level controlled Vital Signs Assessment: post-procedure vital signs reviewed and stable Respiratory status: spontaneous breathing, nonlabored ventilation, respiratory function stable and patient connected to nasal cannula oxygen Cardiovascular status: blood pressure returned to baseline and stable Postop Assessment: no apparent nausea or vomiting Anesthetic complications: no   No complications documented.  Alta Corning

## 2020-05-03 NOTE — Op Note (Signed)
[facility] Gastroenterology Patient Name: Anna Mcgee Procedure Date: 05/03/2020 11:12 AM MRN: [id number] Account #: [id number] Date of Birth: June 23, 1960 Admit Type: Outpatient Age: 59 Room: [facility] OR ROOM 01 Gender: Female Note Status: Finalized Procedure:             Colonoscopy Indications:           Unexplained iron deficiency anemia Providers:             Toney Reil MD, MD Referring MD:          Lyn Records. Arnett (Referring MD) Medicines:             General Anesthesia Complications:         No immediate complications. Estimated blood loss: None. Procedure:             Pre-Anesthesia Assessment:                        - Prior to the procedure, a History and Physical was                         performed, and patient medications and allergies were                         reviewed. The patient is competent. The risks and                         benefits of the procedure and the sedation options and                         risks were discussed with the patient. All questions                         were answered and informed consent was obtained.                         Patient identification and proposed procedure were                         verified by the physician, the nurse, the                         anesthesiologist, the anesthetist and the technician                         in the pre-procedure area in the procedure room in the                         endoscopy suite. Mental Status Examination: alert and                         oriented. Airway Examination: normal oropharyngeal                         airway and neck mobility. Respiratory Examination:                         clear to auscultation. CV Examination: normal.  Prophylactic Antibiotics: The patient does not require                         prophylactic antibiotics. Prior Anticoagulants: The                         patient has taken no previous anticoagulant or                          antiplatelet agents. ASA Grade Assessment: II - A                         patient with mild systemic disease. After reviewing                         the risks and benefits, the patient was deemed in                         satisfactory condition to undergo the procedure. The                         anesthesia plan was to use general anesthesia.                         Immediately prior to administration of medications,                         the patient was re-assessed for adequacy to receive                         sedatives. The heart rate, respiratory rate, oxygen                         saturations, blood pressure, adequacy of pulmonary                         ventilation, and response to care were monitored                         throughout the procedure. The physical status of the                         patient was re-assessed after the procedure.                        After obtaining informed consent, the colonoscope was                         passed under direct vision. Throughout the procedure,                         the patient's blood pressure, pulse, and oxygen                         saturations were monitored continuously. The                         Colonoscope was introduced through the anus and  advanced to the the terminal ileum, with                         identification of the appendiceal orifice and IC                         valve. The colonoscopy was performed without                         difficulty. The patient tolerated the procedure well.                         The quality of the bowel preparation was evaluated                         using the BBPS [facility] Bowel Preparation Scale) with                         scores of: Right Colon = 3, Transverse Colon = 3 and                         Left Colon = 3 (entire mucosa seen well with no                         residual staining, small fragments of stool or  opaque                         liquid). The total BBPS score equals 9. Findings:      The perianal and digital rectal examinations were normal. Pertinent       negatives include normal sphincter tone and no palpable rectal lesions.      The terminal ileum appeared normal.      The entire examined colon appeared normal.      The retroflexed view of the distal rectum and anal verge was normal and       showed no anal or rectal abnormalities. Impression:            - The examined portion of the ileum was normal.                        - The entire examined colon is normal.                        - The distal rectum and anal verge are normal on                         retroflexion view.                        - No specimens collected. Recommendation:        - Discharge patient to home (with escort).                        - Resume previous diet today.                        - Continue present medications.                        -  Repeat colonoscopy in 10 years for screening                         purposes. Procedure Code(s):     --- Professional ---                        [id number], Colonoscopy, flexible; diagnostic, including                         collection of specimen(s) by brushing or washing, when                         performed (separate procedure) Diagnosis Code(s):     --- Professional ---                        D50.9, Iron deficiency anemia, unspecified CPT copyright 2019 American Medical Association. All rights reserved. The codes documented in this report are preliminary and upon coder review may  be revised to meet current compliance requirements. Dr. Libby Maw Toney Reil MD, MD 05/03/2020 11:54:25 AM This report has been signed electronically. Number of Addenda: 0 Note Initiated On: 05/03/2020 11:12 AM Scope Withdrawal Time: 0 hours 6 minutes 48 seconds  Total Procedure Duration: 0 hours 11 minutes 30 seconds  Estimated Blood Loss:  Estimated blood loss:  none.      [facility]

## 2020-05-03 NOTE — Anesthesia Procedure Notes (Signed)
Procedure Name: MAC Date/Time: 05/03/2020 11:29 AM Performed by: Silvana Newness, CRNA Pre-anesthesia Checklist: Patient identified, Emergency Drugs available, Suction available, Patient being monitored and Timeout performed Patient Re-evaluated:Patient Re-evaluated prior to induction Oxygen Delivery Method: Nasal cannula Placement Confirmation: positive ETCO2

## 2020-05-03 NOTE — Op Note (Signed)
[facility] Gastroenterology Patient Name: Anna Mcgee Procedure Date: 05/03/2020 11:13 AM MRN: [id number] Account #: [id number] Date of Birth: [birth year] Admit Type: Outpatient Age: 59 Room: [facility] OR ROOM 01 Gender: Female Note Status: Finalized Procedure:             Upper GI endoscopy Indications:           Unexplained iron deficiency anemia, Status post                         Roux-en-Y Providers:             Toney Reil MD, MD Referring MD:          Lyn Records. Arnett (Referring MD) Medicines:             General Anesthesia Complications:         No immediate complications. Estimated blood loss: None. Procedure:             Pre-Anesthesia Assessment:                        - Prior to the procedure, a History and Physical was                         performed, and patient medications and allergies were                         reviewed. The patient is competent. The risks and                         benefits of the procedure and the sedation options and                         risks were discussed with the patient. All questions                         were answered and informed consent was obtained.                         Patient identification and proposed procedure were                         verified by the physician, the nurse, the                         anesthesiologist, the anesthetist and the technician                         in the pre-procedure area in the procedure room in the                         endoscopy suite. Mental Status Examination: alert and                         oriented. Airway Examination: normal oropharyngeal                         airway and neck mobility. Respiratory Examination:  clear to auscultation. CV Examination: normal.                         Prophylactic Antibiotics: The patient does not require                         prophylactic antibiotics. Prior Anticoagulants: The                          patient has taken no previous anticoagulant or                         antiplatelet agents. ASA Grade Assessment: II - A                         patient with mild systemic disease. After reviewing                         the risks and benefits, the patient was deemed in                         satisfactory condition to undergo the procedure. The                         anesthesia plan was to use general anesthesia.                         Immediately prior to administration of medications,                         the patient was re-assessed for adequacy to receive                         sedatives. The heart rate, respiratory rate, oxygen                         saturations, blood pressure, adequacy of pulmonary                         ventilation, and response to care were monitored                         throughout the procedure. The physical status of the                         patient was re-assessed after the procedure.                        After obtaining informed consent, the endoscope was                         passed under direct vision. Throughout the procedure,                         the patient's blood pressure, pulse, and oxygen                         saturations were monitored continuously. The Endoscope  was introduced through the mouth, and advanced to the                         second part of duodenum. The upper GI endoscopy was                         accomplished without difficulty. The patient tolerated                         the procedure well. Findings:      Evidence of a Roux-en-Y gastrojejunostomy was found. The gastrojejunal       anastomosis was characterized by healthy appearing mucosa. This was       traversed. The pouch-to-jejunum limb was characterized by healthy       appearing mucosa. The jejunojejunal anastomosis was characterized by       healthy appearing mucosa. The duodenum-to-jejunum limb was not examined        as it could not be traversed. Staple was removed with forceps      The gastroesophageal junction and examined esophagus were normal. Impression:            - Roux-en-Y gastrojejunostomy with gastrojejunal                         anastomosis characterized by healthy appearing mucosa.                        - Normal gastroesophageal junction and esophagus.                        - No specimens collected. Recommendation:        - Continue present medications.                        - Proceed with colonoscopy as scheduled                        See colonoscopy report Procedure Code(s):     --- Professional ---                        [phone], Esophagogastroduodenoscopy, flexible,                         transoral; diagnostic, including collection of                         specimen(s) by brushing or washing, when performed                         (separate procedure) Diagnosis Code(s):     --- Professional ---                        Z98.0, Intestinal bypass and anastomosis status                        D50.9, Iron deficiency anemia, unspecified CPT copyright 2019 American Medical Association. All rights reserved. The codes documented in this report are preliminary and upon coder review may  be revised to meet current compliance requirements. Dr. Libby Maw Toney Reil MD, MD 05/03/2020 11:40:01  AM This report has been signed electronically. Number of Addenda: 0 Note Initiated On: 05/03/2020 11:13 AM Total Procedure Duration: 0 hours 6 minutes 27 seconds  Estimated Blood Loss:  Estimated blood loss: none.      [facility]

## 2020-05-04 ENCOUNTER — Encounter: Payer: Self-pay | Admitting: Gastroenterology

## 2020-05-15 ENCOUNTER — Inpatient Hospital Stay: Payer: Managed Care, Other (non HMO) | Attending: Internal Medicine

## 2020-05-16 ENCOUNTER — Other Ambulatory Visit: Payer: Self-pay | Admitting: *Deleted

## 2020-05-16 DIAGNOSIS — E611 Iron deficiency: Secondary | ICD-10-CM

## 2020-05-17 ENCOUNTER — Telehealth: Payer: Self-pay | Admitting: *Deleted

## 2020-05-17 NOTE — Telephone Encounter (Signed)
Contacted [facility]- labs will be [facility]ed to the cancer [facility] prior to patient's apts tomorrow.

## 2020-05-18 ENCOUNTER — Encounter: Payer: Self-pay | Admitting: Internal Medicine

## 2020-05-18 ENCOUNTER — Inpatient Hospital Stay: Payer: Managed Care, Other (non HMO)

## 2020-05-18 ENCOUNTER — Inpatient Hospital Stay: Payer: Managed Care, Other (non HMO) | Attending: Internal Medicine | Admitting: Internal Medicine

## 2020-05-18 ENCOUNTER — Other Ambulatory Visit: Payer: Self-pay

## 2020-05-18 DIAGNOSIS — D508 Other iron deficiency anemias: Secondary | ICD-10-CM | POA: Diagnosis not present

## 2020-05-18 DIAGNOSIS — E611 Iron deficiency: Secondary | ICD-10-CM | POA: Diagnosis not present

## 2020-05-18 DIAGNOSIS — Z9884 Bariatric surgery status: Secondary | ICD-10-CM | POA: Insufficient documentation

## 2020-05-18 DIAGNOSIS — K909 Intestinal malabsorption, unspecified: Secondary | ICD-10-CM | POA: Diagnosis not present

## 2020-05-18 DIAGNOSIS — Z87891 Personal history of nicotine dependence: Secondary | ICD-10-CM | POA: Insufficient documentation

## 2020-05-18 NOTE — Assessment & Plan Note (Addendum)
#   Iron def anemia-June 2021 iron sat -10%; ferritin-6; hemoglobin February 2021-11; s/p IV venofer weekly x4; today hemoglobin 12.5.  Ferritin 100.  Hold iron infusion.  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  # DISPOSITION: # HOLD Venfoer today # follow up in 4 months- MD ;[facility]-cbc/bmp/iron studies/ferritin- prior- possible venofer-Dr.B

## 2020-05-19 NOTE — Progress Notes (Signed)
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  HISTORY OF PRESENTING ILLNESS:  Anna Mcgee 59 y.o.  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  In the interval patient has been evaluated by GI underwent EGD colonoscopy.   Patient denies any worsening fatigue.  Denies any blood in stools or black or stools.  No nausea vomiting.   Review of Systems  Constitutional: Positive for malaise/fatigue. Negative for chills, diaphoresis, fever and weight loss.  HENT: Negative for nosebleeds and sore throat.   Eyes: Negative for double vision.  Respiratory: Negative for cough, hemoptysis, sputum production, shortness of breath and wheezing.   Cardiovascular: Negative for chest pain, palpitations, orthopnea and leg swelling.  Gastrointestinal: Negative for abdominal pain, blood in stool, constipation, diarrhea, heartburn, melena, nausea and vomiting.  Genitourinary: Negative for dysuria, frequency and urgency.  Musculoskeletal: Positive for back pain and joint pain.  Skin: Negative.  Negative for itching and rash.  Neurological: Negative for dizziness, tingling, focal weakness, weakness and headaches.  Endo/Heme/Allergies: Does not bruise/bleed easily.  Psychiatric/Behavioral: Negative for depression. The patient is not nervous/anxious and does not have insomnia.     MEDICAL HISTORY:  Past Medical History:  Diagnosis Date  . Heart murmur   . History of anemia   . Hypertension   . Postmenopausal bleeding   . Vitamin D deficiency   . Vitamin D deficiency   . Wears dentures    partial lower    SURGICAL HISTORY:  Past Surgical History:  Procedure Laterality Date  . COLONOSCOPY WITH PROPOFOL N/A 05/03/2020   Procedure: COLONOSCOPY WITH PROPOFOL;  Surgeon: Toney Reil, MD;  Location: [facility];  Service: Gastroenterology;  Laterality: N/A;  . ESOPHAGOGASTRODUODENOSCOPY (EGD) WITH PROPOFOL N/A 05/03/2020   Procedure: ESOPHAGOGASTRODUODENOSCOPY (EGD) WITH PROPOFOL;  Surgeon: Toney Reil, MD;  Location: [facility];  Service: Gastroenterology;  Laterality: N/A;  . GASTRIC BYPASS  2006    SOCIAL HISTORY: Social History   Socioeconomic History  . Marital status: Single    Spouse name: Not on file  . Number of children: Not on file  . Years of education: Not on file  . Highest education level: Not on file  Occupational History  . Not on file  Tobacco Use  . Smoking status: Former Smoker    Types: Cigarettes    Quit date: 08/01/2010    Years since quitting: 9.8  . Smokeless tobacco: Never Used  . Tobacco comment: Vapes  Vaping Use  . Vaping Use: Every day  . Substances: Nicotine  . Devices: Logic  Substance and Sexual Activity  . Alcohol use: Yes    Alcohol/week: 0.0 standard drinks    Comment: Rare  . Drug use: No  . Sexual activity: Not Currently  Other Topics Concern  . Not on file  Social History Narrative   Single ; Employed at [employer] ; Children- 0 ; Caffeine- coffee 3 cups, diet soda 1, unsweet tea       Lives in [place]. Quit smoking- ~10 years; no alcohol. vapes- nicotine.    Social Determinants of Health   Financial Resource Strain:   . Difficulty of Paying Living Expenses: Not on file  Food Insecurity:   . Worried About Programme researcher, broadcasting/film/video in the Last Year: Not on file  . Ran Out of Food in the Last Year: Not on file  Transportation Needs:   . Lack of Transportation (Medical): Not on file  . Lack of Transportation (Non-Medical): Not on file  Physical Activity:   . Days of Exercise per Week: Not on file  . Minutes of  Exercise per Session: Not on file  Stress:   . Feeling of Stress : Not on file  Social Connections:   . Frequency of Communication with Friends and Family: Not on file  . Frequency of Social Gatherings with Friends and Family: Not on file  . Attends Religious Services: Not on file  . Active Member of Clubs or Organizations: Not on file  . Attends Banker Meetings: Not on file  . Marital Status: Not on file  Intimate Partner Violence:   . Fear of Current or Ex-Partner: Not on file  . Emotionally Abused: Not on file  . Physically Abused: Not on file  . Sexually Abused: Not on file    FAMILY HISTORY: Family History  Problem Relation Age of Onset  . Hypertension Mother   . Hypertension Father   . Arthritis Maternal Grandmother   . Colon cancer Neg Hx     ALLERGIES:  is allergic to amlodipine and penicillins.  MEDICATIONS:  Current Outpatient Medications  Medication Sig Dispense Refill  . buPROPion (WELLBUTRIN XL) 300 MG 24 hr tablet Take 1 tablet (300 mg total) by mouth every morning. 90 tablet 0  . loratadine (CLARITIN) 10 MG tablet Take 1 tablet (10 mg total) by mouth daily. 30 tablet 2  . losartan (COZAAR) 50 MG tablet Take 1 tablet (50 mg total) by mouth daily. 90 tablet 3  . triamcinolone cream (KENALOG) 0.1 % Apply 1 application topically 2 (two) times daily. 30 g 0   No current facility-administered medications for this visit.      PHYSICAL EXAMINATION:   Vitals:   05/18/20 1300  BP: (!) 145/80  Pulse: 81  Resp: 16  Temp: 97.8 F (36.6 C)  SpO2: 100%   Filed Weights   05/18/20 1300  Weight: 215 lb 12.8 oz (97.9 kg)    Physical Exam HENT:     Head: Normocephalic and atraumatic.     Mouth/Throat:     Pharynx: No oropharyngeal exudate.  Eyes:     Pupils: Pupils are equal, round, and reactive to light.  Cardiovascular:     Rate and Rhythm: Normal rate and regular rhythm.  Pulmonary:     Effort: No respiratory distress.     Breath  sounds: No wheezing.  Abdominal:     General: Bowel sounds are normal. There is no distension.     Palpations: Abdomen is soft. There is no mass.     Tenderness: There is no abdominal tenderness. There is no guarding or rebound.  Musculoskeletal:        General: No tenderness. Normal range of motion.     Cervical back: Normal range of motion and neck supple.  Skin:    General: Skin is warm.  Neurological:     Mental Status: She is alert and oriented to person, place, and time.  Psychiatric:        Mood and Affect: Affect normal.     LABORATORY DATA:  I have reviewed the data as listed Lab Results  Component Value Date   WBC 4.7 08/08/2019  HGB 11.0 (L) 08/08/2019   HCT 35.0 (L) 08/08/2019   MCV 82.5 08/08/2019   PLT 291 08/08/2019   Recent Labs    08/08/19 2110 02/29/20 0748  NA 140 142  K 3.7 4.1  CL 105 104  CO2 26 24  GLUCOSE 112* 85  BUN 18 21  CREATININE 1.00 0.98  CALCIUM 9.3 9.4  GFRNONAA >60 63  GFRAA >60 73  PROT 7.3  --   ALBUMIN 4.1  --   AST 26  --   ALT 18  --   ALKPHOS 66  --   BILITOT 0.6  --      No results found.  Iron deficiency # Iron def anemia-June 2021 iron sat -10%; ferritin-6; hemoglobin February 2021-11; s/p IV venofer weekly x4; today hemoglobin 12.5.  Ferritin 100.  Hold iron infusion.  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  # DISPOSITION: # HOLD Venfoer today # follow up in 4 months- MD ;[facility]c/bmp/iron studies/ferritin- prior- possible venofer-Dr.B  All questions were answered. The patient knows to call the clinic with any problems, questions or concerns.   Earna Coder, MD 05/19/2020 7:17 AM

## 2020-05-21 ENCOUNTER — Encounter: Payer: Self-pay | Admitting: Family

## 2020-05-21 ENCOUNTER — Other Ambulatory Visit: Payer: Self-pay | Admitting: Family

## 2020-05-21 DIAGNOSIS — E559 Vitamin D deficiency, unspecified: Secondary | ICD-10-CM

## 2020-05-31 ENCOUNTER — Encounter: Payer: Self-pay | Admitting: Family

## 2020-05-31 ENCOUNTER — Other Ambulatory Visit: Payer: Self-pay

## 2020-05-31 DIAGNOSIS — R8290 Unspecified abnormal findings in urine: Secondary | ICD-10-CM

## 2020-06-01 ENCOUNTER — Other Ambulatory Visit: Payer: Managed Care, Other (non HMO)

## 2020-06-01 ENCOUNTER — Other Ambulatory Visit: Payer: Self-pay

## 2020-06-01 DIAGNOSIS — R8290 Unspecified abnormal findings in urine: Secondary | ICD-10-CM

## 2020-06-01 NOTE — Addendum Note (Signed)
Addended by: Warden Fillers on: 06/01/2020 11:20 AM   Modules accepted: Orders

## 2020-06-01 NOTE — Addendum Note (Signed)
Addended by: Negar Sieler S on: 06/01/2020 11:20 AM   Modules accepted: Orders  

## 2020-06-04 ENCOUNTER — Telehealth: Payer: Self-pay | Admitting: Family

## 2020-06-04 ENCOUNTER — Encounter: Payer: Self-pay | Admitting: Family

## 2020-06-04 ENCOUNTER — Telehealth (INDEPENDENT_AMBULATORY_CARE_PROVIDER_SITE_OTHER): Payer: Managed Care, Other (non HMO) | Admitting: Family

## 2020-06-04 DIAGNOSIS — R8290 Unspecified abnormal findings in urine: Secondary | ICD-10-CM | POA: Insufficient documentation

## 2020-06-04 NOTE — Progress Notes (Signed)
Verbal consent for services obtained from patient prior to services given to TELEPHONE visit:   Location of call:  provider at work patient at home  Names of all persons present for services: Rennie Plowman, NP and patient Acute visit  Chief complaint: cloudy urine and sour smell in the morning, improves as day goes on drinks water. Has been experiencing for past 8 weeks and some mornings not there.  Urine is concentrated. No fever, dysuria, flank pain, vaginal itching. NO changes in vaginal concern. Not currently sexually active    A/P/next steps:  Problem List Items Addressed This Visit      Other   Abnormal urine odor    Duration 8 weeks. No h/ o DM. Urine negative for blood , nitrites, casts, leukocytes. Urine culture has 25, 000- 50,000 CFU which is not diagnostic of infection. Question whether physiologic after urine is in bladder overnight. I suspect dehydration may be playing a role and patient will optimize hydration and note if symptom improves when urine is lighter in color. Pending urine cytology for thoroughness.       Relevant Orders   Cytology, urine       I spent 10 min  discussing plan of care over the phone.

## 2020-06-04 NOTE — Assessment & Plan Note (Addendum)
Duration 8 weeks. No h/ o DM. Urine negative for blood , nitrites, casts, leukocytes. Urine culture has 25, 000- 50,000 CFU which is not diagnostic of infection. Question whether physiologic after urine is in bladder overnight. I suspect dehydration may be playing a role and patient will optimize hydration and note if symptom improves when urine is lighter in color. Pending urine cytology for thoroughness.

## 2020-06-04 NOTE — Telephone Encounter (Signed)
Call pt Mammogram due and I have ordered She needs to call [place] and sch Please provide number

## 2020-06-04 NOTE — Telephone Encounter (Signed)
I called & LM with number to number asking pt to call to schedule.

## 2020-06-06 LAB — CYTOLOGY, URINE

## 2020-06-11 ENCOUNTER — Other Ambulatory Visit: Payer: Self-pay | Admitting: Family

## 2020-06-11 DIAGNOSIS — R8271 Bacteriuria: Secondary | ICD-10-CM

## 2020-06-11 LAB — URINALYSIS, MICROSCOPIC ONLY
Bacteria, UA: NONE SEEN
Casts: NONE SEEN /lpf
WBC, UA: NONE SEEN /hpf (ref 0–5)

## 2020-06-11 LAB — URINE CULTURE

## 2020-06-11 LAB — URINALYSIS
Bilirubin, UA: NEGATIVE
Glucose, UA: NEGATIVE
Ketones, UA: NEGATIVE
Nitrite, UA: NEGATIVE
Protein,UA: NEGATIVE
RBC, UA: NEGATIVE
Specific Gravity, UA: 1.015 (ref 1.005–1.030)
Urobilinogen, Ur: 0.2 mg/dL (ref 0.2–1.0)
pH, UA: 5.5 (ref 5.0–7.5)

## 2020-06-11 MED ORDER — NITROFURANTOIN MONOHYD MACRO 100 MG PO CAPS: 100.0000 mg | ORAL_CAPSULE | Freq: Two times a day (BID) | ORAL | 0 refills | Status: DC

## 2020-06-12 ENCOUNTER — Telehealth: Payer: Self-pay

## 2020-06-12 NOTE — Telephone Encounter (Signed)
LMTCB for labs. 

## 2020-06-26 ENCOUNTER — Other Ambulatory Visit: Payer: Self-pay

## 2020-06-26 DIAGNOSIS — I10 Essential (primary) hypertension: Secondary | ICD-10-CM

## 2020-06-26 DIAGNOSIS — F339 Major depressive disorder, recurrent, unspecified: Secondary | ICD-10-CM

## 2020-06-26 MED ORDER — BUPROPION HCL ER (XL) 300 MG PO TB24: 300.0000 mg | ORAL_TABLET | Freq: Every morning | ORAL | 0 refills | Status: DC

## 2020-06-26 MED ORDER — LOSARTAN POTASSIUM 50 MG PO TABS: 50.0000 mg | ORAL_TABLET | Freq: Every day | ORAL | 3 refills | Status: DC

## 2020-07-09 ENCOUNTER — Telehealth (INDEPENDENT_AMBULATORY_CARE_PROVIDER_SITE_OTHER): Payer: Managed Care, Other (non HMO) | Admitting: Family

## 2020-07-09 ENCOUNTER — Encounter: Payer: Self-pay | Admitting: Family

## 2020-07-09 DIAGNOSIS — I10 Essential (primary) hypertension: Secondary | ICD-10-CM | POA: Diagnosis not present

## 2020-07-09 DIAGNOSIS — R0602 Shortness of breath: Secondary | ICD-10-CM | POA: Diagnosis not present

## 2020-07-09 MED ORDER — ALBUTEROL SULFATE HFA 108 (90 BASE) MCG/ACT IN AERS: 2.0000 | INHALATION_SPRAY | Freq: Four times a day (QID) | RESPIRATORY_TRACT | 0 refills | Status: DC | PRN

## 2020-07-09 MED ORDER — AZELASTINE HCL 0.1 % NA SOLN: 1.0000 | Freq: Two times a day (BID) | NASAL | 4 refills | Status: DC

## 2020-07-09 NOTE — Assessment & Plan Note (Signed)
Historically poor control. Advised to call with BP readings from  Home. Close follow up in person

## 2020-07-09 NOTE — Assessment & Plan Note (Signed)
One week. Aggravating features do not appear consistent. She is not labored in speech today and able to walk in parking lot during virtual without distress. I question if she has nasal congestion after possible ear infection 2 weeks ago or if this is seasonal allergies. Pending CXR, trial of azelastine, close follow up.

## 2020-07-09 NOTE — Progress Notes (Signed)
Virtual Visit via Video Note  I connected with@  on 07/09/20 at 12:00 PM EST by a video enabled telemedicine application and verified that I am speaking with the correct person using two identifiers.  Location patient: home Location provider:work  Persons participating in the virtual visit: patient, provider  I discussed the limitations of evaluation and management by telemedicine and the availability of in person appointments. The patient expressed understanding and agreed to proceed.   HPI: Complains of SOB started 1 week ago, insidious onset, comes and goes. No particular pattern.  Notices SOB when sitting and talking; she notes she will have to stop, take a breath. Then other times 'forget that I even have it ' as she had no shortness of breath and able to go about her day normally.   Feels better breathing through her mouth versus nose. Some congestion.  She walks from parking lot to work without sob. No leg swelling, cp, sob, wheezing, cough, dizziness, palpitations, choking.   She had left ear pain 2 weeks ago. Associated with HA. Ear pain and HA since resolved one week ago. She tested for covid multiple times which was negative. She never had cough, fever.  No ho DVT.   Former smoker No h/o asthma. Never been on inhaler.  H/o seasonal allergies. Not on birth control.   HTN- compliant with losartan 50mg . Hasnt check blood pressure in a while.    ROS: See pertinent positives and negatives per HPI.    EXAM:  VITALS per patient if applicable: There were no vitals taken for this visit. BP Readings from Last 3 Encounters:  05/18/20 (!) 145/80  05/03/20 (!) 144/77  04/04/20 (!) 144/98   Wt Readings from Last 3 Encounters:  06/04/20 215 lb (97.5 kg)  05/18/20 215 lb 12.8 oz (97.9 kg)  05/03/20 210 lb (95.3 kg)    GENERAL: alert, oriented, appears well and in no acute distress  HEENT: atraumatic, conjunttiva clear, no obvious abnormalities on inspection of external  nose and ears  NECK: normal movements of the head and neck  LUNGS: on inspection no signs of respiratory distress, breathing rate appears normal, no obvious gross SOB, gasping or wheezing  CV: no obvious cyanosis  MS: moves all visible extremities without noticeable abnormality  PSYCH/NEURO: pleasant and cooperative, no obvious depression or anxiety, speech and thought processing grossly intact  ASSESSMENT AND PLAN:  Discussed the following assessment and plan:  Problem List Items Addressed This Visit      Cardiovascular and Mediastinum   Hypertension    Historically poor control. Advised to call with BP readings from  Home. Close follow up in person        Other   SOB (shortness of breath) - Primary    One week. Aggravating features do not appear consistent. She is not labored in speech today and able to walk in parking lot during virtual without distress. I question if she has nasal congestion after possible ear infection 2 weeks ago or if this is seasonal allergies. Pending CXR, trial of azelastine, close follow up.       Relevant Medications   azelastine (ASTELIN) 0.1 % nasal spray   albuterol (VENTOLIN HFA) 108 (90 Base) MCG/ACT inhaler   Other Relevant Orders   DG Chest 2 View      -we discussed possible serious and likely etiologies, options for evaluation and workup, limitations of telemedicine visit vs in person visit, treatment, treatment risks and precautions. Pt prefers to treat via telemedicine empirically  rather then risking or undertaking an in person visit at this moment.  .   I discussed the assessment and treatment plan with the patient. The patient was provided an opportunity to ask questions and all were answered. The patient agreed with the plan and demonstrated an understanding of the instructions.   The patient was advised to call back or seek an in-person evaluation if the symptoms worsen or if the condition fails to improve as  anticipated.   Rennie Plowman, FNP

## 2020-07-09 NOTE — Patient Instructions (Signed)
Trial azelastine, albuterol.   Chest xray at [facility]

## 2020-07-10 ENCOUNTER — Encounter: Payer: Self-pay | Admitting: Family

## 2020-08-02 ENCOUNTER — Other Ambulatory Visit: Payer: Self-pay | Admitting: Family

## 2020-08-02 DIAGNOSIS — R0602 Shortness of breath: Secondary | ICD-10-CM

## 2020-08-06 ENCOUNTER — Encounter: Payer: Self-pay | Admitting: Family

## 2020-08-06 ENCOUNTER — Other Ambulatory Visit: Payer: Self-pay

## 2020-08-06 ENCOUNTER — Ambulatory Visit: Payer: Managed Care, Other (non HMO) | Admitting: Family

## 2020-08-06 VITALS — BP 134/60 | HR 79 | Temp 97.4°F | Ht 65.98 in

## 2020-08-06 DIAGNOSIS — I10 Essential (primary) hypertension: Secondary | ICD-10-CM

## 2020-08-06 DIAGNOSIS — R0602 Shortness of breath: Secondary | ICD-10-CM | POA: Diagnosis not present

## 2020-08-06 DIAGNOSIS — Z1231 Encounter for screening mammogram for malignant neoplasm of breast: Secondary | ICD-10-CM | POA: Diagnosis not present

## 2020-08-06 DIAGNOSIS — D649 Anemia, unspecified: Secondary | ICD-10-CM

## 2020-08-06 MED ORDER — LOSARTAN POTASSIUM 25 MG PO TABS: 25.0000 mg | ORAL_TABLET | Freq: Every day | ORAL | 1 refills | Status: DC

## 2020-08-06 NOTE — Addendum Note (Signed)
Addended by: Bonnell Public I on: 08/06/2020 09:47 AM   Modules accepted: Orders

## 2020-08-06 NOTE — Assessment & Plan Note (Signed)
Uncontrolled. Increase losartan to 75mg .labs at [facility] in one week.

## 2020-08-06 NOTE — Addendum Note (Signed)
Addended by: Warden Fillers on: 08/06/2020 10:00 AM   Modules accepted: Orders

## 2020-08-06 NOTE — Progress Notes (Signed)
Subjective:    Patient ID: Anna Mcgee, female    DOB: March 03, 1961, 60 y.o.   MRN: [id number]  CC: Anna Mcgee is a 60 y.o. female who presents today for follow up.   HPI: Complains of sob x 2-3 , She is not SOB when talking or necessarily with activity. It is a feeling that comes at random. Not constant. Some days it is not present at all and 'I forget about it' .   Chronic fatigue however 'thinks due for iron infusion.' She doesn't feel tired after an infusion.   No fever, wheezing, rattle in chest, PND,nasal congestion, cough, orthopnea.  She parks at furthest parking spot at work and walks up the stairs daily.No cp, sob.   Tried albuterol inhaler, azelastine without any difference. Compliant with zrytec for seasonal allergies such as pollen as well as dust.   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 She had had venofer IV however none at last visit 05/2020.   Right ear pain resolved.    HTN- compliant with losartan 50mg .     Seen 07/09/20 for SOB. NO cxr at that time.   Former smoker; quit 2012  HISTORY:  Past Medical History:  Diagnosis Date  . Heart murmur   . History of anemia   . Hypertension   . Postmenopausal bleeding   . Vitamin D deficiency   . Vitamin D deficiency   . Wears dentures    partial lower   Past Surgical History:  Procedure Laterality Date  . COLONOSCOPY WITH PROPOFOL N/A 05/03/2020   Procedure: COLONOSCOPY WITH PROPOFOL;  Surgeon: 05/05/2020, MD;  Location: [facility];  Service: Gastroenterology;  Laterality: N/A;  . ESOPHAGOGASTRODUODENOSCOPY (EGD) WITH PROPOFOL N/A 05/03/2020   Procedure: ESOPHAGOGASTRODUODENOSCOPY (EGD) WITH PROPOFOL;  Surgeon: 05/05/2020, MD;  Location: [facility];  Service: Gastroenterology;  Laterality: N/A;  . GASTRIC BYPASS  2006   Family History  Problem Relation Age of Onset  . Hypertension Mother   . Hypertension Father   .  Arthritis Maternal Grandmother   . Colon cancer Neg Hx     Allergies: Amlodipine and Penicillins Current Outpatient Medications on File Prior to Visit  Medication Sig Dispense Refill  . albuterol (VENTOLIN HFA) 108 (90 Base) MCG/ACT inhaler INHALE 2 PUFFS INTO THE LUNGS EVERY 6 HOURS AS NEEDED FOR WHEEZING OR SHORTNESS OF BREATH 6.7 g 1  . azelastine (ASTELIN) 0.1 % nasal spray Place 1 spray into both nostrils 2 (two) times daily. Use in each nostril as directed 30 mL 4  . buPROPion (WELLBUTRIN XL) 300 MG 24 hr tablet Take 1 tablet (300 mg total) by mouth every morning. 90 tablet 0  . loratadine (CLARITIN) 10 MG tablet Take 1 tablet (10 mg total) by mouth daily. 30 tablet 2  . losartan (COZAAR) 50 MG tablet Take 1 tablet (50 mg total) by mouth daily. 90 tablet 3  . triamcinolone cream (KENALOG) 0.1 % Apply 1 application topically 2 (two) times daily. 30 g 0   No current facility-administered medications on file prior to visit.    Social History   Tobacco Use  . Smoking status: Former Smoker    Types: Cigarettes    Quit date: 08/01/2010    Years since quitting: 10.0  . Smokeless tobacco: Never Used  . Tobacco comment: Vapes  Vaping Use  . Vaping Use: Every day  . Substances: Nicotine  . Devices: Logic  Substance Use Topics  .  Alcohol use: Yes    Alcohol/week: 0.0 standard drinks    Comment: Rare  . Drug use: No    Review of Systems  Constitutional: Positive for fatigue. Negative for chills, fever and unexpected weight change.  HENT: Negative for congestion, ear pain (resolved) and postnasal drip.   Respiratory: Positive for shortness of breath. Negative for cough, chest tightness and wheezing.   Cardiovascular: Negative for chest pain, palpitations and leg swelling.  Gastrointestinal: Negative for nausea and vomiting.  Neurological: Negative for headaches.      Objective:    BP 134/60 (BP Location: Left Arm, Patient Position: Sitting)   Pulse 79   Temp (!) 97.4 F  (36.3 C)   Ht 5' 5.98" (1.676 m)   SpO2 98%   BMI 34.72 kg/m  BP Readings from Last 3 Encounters:  08/06/20 134/60  05/18/20 (!) 145/80  05/03/20 (!) 144/77   Wt Readings from Last 3 Encounters:  06/04/20 215 lb (97.5 kg)  05/18/20 215 lb 12.8 oz (97.9 kg)  05/03/20 210 lb (95.3 kg)    Physical Exam Vitals reviewed.  Constitutional:      Appearance: She is well-developed and well-nourished.  Eyes:     Conjunctiva/sclera: Conjunctivae normal.  Cardiovascular:     Rate and Rhythm: Normal rate and regular rhythm.     Pulses: Normal pulses.     Heart sounds: Normal heart sounds.  Pulmonary:     Effort: Pulmonary effort is normal.     Breath sounds: Normal breath sounds. No wheezing, rhonchi or rales.  Musculoskeletal:     Right lower leg: No edema.     Left lower leg: No edema.  Skin:    General: Skin is warm and dry.  Neurological:     Mental Status: She is alert.  Psychiatric:        Mood and Affect: Mood and affect normal.        Speech: Speech normal.        Behavior: Behavior normal.        Thought Content: Thought content normal.        Assessment & Plan:   Problem List Items Addressed This Visit      Cardiovascular and Mediastinum   HTN (hypertension) - Primary    Uncontrolled. Increase losartan to 75mg .labs at [facility] in one week.      Relevant Medications   losartan (COZAAR) 25 MG tablet   Other Relevant Orders   Basic metabolic panel     Other   Anemia   Relevant Orders   CBC with Differential/Platelet   Iron, TIBC and Ferritin Panel   SOB (shortness of breath)    No acute respiratory distress. We walked up and down the hallway and sa02> 94% and HR 80's. She was not labored in speech as we walked.   Symptom is not worsened by activity and more often occurs at rest. No evidence of fluid volume overload. Symptom presentation consistent with anemia versus underlying respiratory disease. However we discussed long standing piror h/o smoking . If  ferritin normal and/or patient doesn't improve with venofer IV with heme, we discussed trial of symbicort and baseline echocardiogram. Pending iron studies and will consult with Dr .       Other Visit Diagnoses    Encounter for screening mammogram for malignant neoplasm of breast       Relevant Orders   MM 3D SCREEN BREAST BILATERAL       I have discontinued Journe A. Rundle "[name]"'s  phentermine. I am also having her start on losartan. Additionally, I am having her maintain her triamcinolone, loratadine, buPROPion, losartan, azelastine, and albuterol.   Meds ordered this encounter  Medications  . losartan (COZAAR) 25 MG tablet    Sig: Take 1 tablet (25 mg total) by mouth daily.    Dispense:  90 tablet    Refill:  1    Order Specific Question:   Supervising Provider    Answer:   Sherlene Shams [[id number]]    Return precautions given.   Risks, benefits, and alternatives of the medications and treatment plan prescribed today were discussed, and patient expressed understanding.   Education regarding symptom management and diagnosis given to patient on AVS.  Continue to follow with Allegra Grana, FNP for routine health maintenance.   Anna Mcgee and I agreed with plan.   Rennie Plowman, FNP

## 2020-08-06 NOTE — Assessment & Plan Note (Addendum)
No acute respiratory distress. We walked up and down the hallway and sa02> 94% and HR 80's. She was not labored in speech as we walked.   Symptom is not worsened by activity and more often occurs at rest. No evidence of fluid volume overload. Symptom presentation consistent with anemia versus underlying respiratory disease. However we discussed long standing piror h/o smoking . If ferritin normal and/or patient doesn't improve with venofer IV with heme, we discussed trial of symbicort and baseline echocardiogram. Pending iron studies and will consult with Dr Donneta Romberg.

## 2020-08-06 NOTE — Patient Instructions (Signed)
Suspect your iron stores are low causing shortness of breath Please continue to monitor closely  LABS IN ONE WEEK  Increase losartan to 75mg  TOTAL taken at once daily  Monitor blood pressure at home and me 5-6 reading on separate days. Goal is less than 120/80, based on newest guidelines, however we certainly want to be less than 130/80;  if persistently higher, please make sooner follow up appointment so we can recheck you blood pressure and manage/ adjust medications.    Managing Your Hypertension Hypertension, also called high blood pressure, is when the force of the blood pressing against the walls of the arteries is too strong. Arteries are blood vessels that carry blood from your heart throughout your body. Hypertension forces the heart to work harder to pump blood and may cause the arteries to become narrow or stiff. Understanding blood pressure readings Your personal target blood pressure may vary depending on your medical conditions, your age, and other factors. A blood pressure reading includes a higher number over a lower number. Ideally, your blood pressure should be below 120/80. You should know that:  The first, or top, number is called the systolic pressure. It is a measure of the pressure in your arteries as your heart beats.  The second, or bottom number, is called the diastolic pressure. It is a measure of the pressure in your arteries as the heart relaxes. Blood pressure is classified into four stages. Based on your blood pressure reading, your health care provider may use the following stages to determine what type of treatment you need, if any. Systolic pressure and diastolic pressure are measured in a unit called mmHg. Normal  Systolic pressure: below 120.  Diastolic pressure: below 80. Elevated  Systolic pressure: 120-129.  Diastolic pressure: below 80. Hypertension stage 1  Systolic pressure: 130-139.  Diastolic pressure: 80-89. Hypertension stage  2  Systolic pressure: 140 or above.  Diastolic pressure: 90 or above. How can this condition affect me? Managing your hypertension is an important responsibility. Over time, hypertension can damage the arteries and decrease blood flow to important parts of the body, including the brain, heart, and kidneys. Having untreated or uncontrolled hypertension can lead to:  A heart attack.  A stroke.  A weakened blood vessel (aneurysm).  Heart failure.  Kidney damage.  Eye damage.  Metabolic syndrome.  Memory and concentration problems.  Vascular dementia. What actions can I take to manage this condition? Hypertension can be managed by making lifestyle changes and possibly by taking medicines. Your health care provider will help you make a plan to bring your blood pressure within a normal range. Nutrition  Eat a diet that is high in fiber and potassium, and low in salt (sodium), added sugar, and fat. An example eating plan is called the Dietary Approaches to Stop Hypertension (DASH) diet. To eat this way: ? Eat plenty of fresh fruits and vegetables. Try to fill one-half of your plate at each meal with fruits and vegetables. ? Eat whole grains, such as whole-wheat pasta, brown rice, or whole-grain bread. Fill about one-fourth of your plate with whole grains. ? Eat low-fat dairy products. ? Avoid fatty cuts of meat, processed or cured meats, and poultry with skin. Fill about one-fourth of your plate with lean proteins such as fish, chicken without skin, beans, eggs, and tofu. ? Avoid pre-made and processed foods. These tend to be higher in sodium, added sugar, and fat.  Reduce your daily sodium intake. Most people with hypertension should eat less  than 1,500 mg of sodium a day.   Lifestyle  Work with your health care provider to maintain a healthy body weight or to lose weight. Ask what an ideal weight is for you.  Get at least 30 minutes of exercise that causes your heart to beat  faster (aerobic exercise) most days of the week. Activities may include walking, swimming, or biking.  Include exercise to strengthen your muscles (resistance exercise), such as weight lifting, as part of your weekly exercise routine. Try to do these types of exercises for 30 minutes at least 3 days a week.  Do not use any products that contain nicotine or tobacco, such as cigarettes, e-cigarettes, and chewing tobacco. If you need help quitting, ask your health care provider.  Control any long-term (chronic) conditions you have, such as high cholesterol or diabetes.  Identify your sources of stress and find ways to manage stress. This may include meditation, deep breathing, or making time for fun activities.   Alcohol use  Do not drink alcohol if: ? Your health care provider tells you not to drink. ? You are pregnant, may be pregnant, or are planning to become pregnant.  If you drink alcohol: ? Limit how much you use to:  0-1 drink a day for women.  0-2 drinks a day for men. ? Be aware of how much alcohol is in your drink. In the [place] one drink equals one 12 oz bottle of beer (355 mL), one 5 oz glass of wine (148 mL), or one 1 oz glass of hard liquor (44 mL). Medicines Your health care provider may prescribe medicine if lifestyle changes are not enough to get your blood pressure under control and if:  Your systolic blood pressure is 130 or higher.  Your diastolic blood pressure is 80 or higher. Take medicines only as told by your health care provider. Follow the directions carefully. Blood pressure medicines must be taken as told by your health care provider. The medicine does not work as well when you skip doses. Skipping doses also puts you at risk for problems. Monitoring Before you monitor your blood pressure:  Do not smoke, drink caffeinated beverages, or exercise within 30 minutes before taking a measurement.  Use the bathroom and empty your bladder (urinate).  Sit quietly  for at least 5 minutes before taking measurements. Monitor your blood pressure at home as told by your health care provider. To do this:  Sit with your back straight and supported.  Place your feet flat on the floor. Do not cross your legs.  Support your arm on a flat surface, such as a table. Make sure your upper arm is at heart level.  Each time you measure, take two or three readings one minute apart and record the results. You may also need to have your blood pressure checked regularly by your health care provider.   General information  Talk with your health care provider about your diet, exercise habits, and other lifestyle factors that may be contributing to hypertension.  Review all the medicines you take with your health care provider because there may be side effects or interactions.  Keep all visits as told by your health care provider. Your health care provider can help you create and adjust your plan for managing your high blood pressure. Where to find more information  National Heart, Lung, and Blood Institute: PopSteam.is  American Heart Association: www.heart.org Contact a health care provider if:  You think you are having a reaction to  medicines you have taken.  You have repeated (recurrent) headaches.  You feel dizzy.  You have swelling in your ankles.  You have trouble with your vision. Get help right away if:  You develop a severe headache or confusion.  You have unusual weakness or numbness, or you feel faint.  You have severe pain in your chest or abdomen.  You vomit repeatedly.  You have trouble breathing. These symptoms may represent a serious problem that is an emergency. Do not wait to see if the symptoms will go away. Get medical help right away. Call your local emergency services (911 in the [place] Do not drive yourself to the hospital. Summary  Hypertension is when the force of blood pumping through your arteries is too strong. If  this condition is not controlled, it may put you at risk for serious complications.  Your personal target blood pressure may vary depending on your medical conditions, your age, and other factors. For most people, a normal blood pressure is less than 120/80.  Hypertension is managed by lifestyle changes, medicines, or both.  Lifestyle changes to help manage hypertension include losing weight, eating a healthy, low-sodium diet, exercising more, stopping smoking, and limiting alcohol. This information is not intended to replace advice given to you by your health care provider. Make sure you discuss any questions you have with your health care provider. Document Revised: 07/08/2019 Document Reviewed: 05/03/2019 Elsevier Patient Education  2021 [employer].

## 2020-08-08 ENCOUNTER — Encounter: Payer: Self-pay | Admitting: Family

## 2020-08-16 LAB — CBC WITH DIFFERENTIAL/PLATELET
Basophils Absolute: 0 10*3/uL (ref 0.0–0.2)
Basos: 1 %
EOS (ABSOLUTE): 0 10*3/uL (ref 0.0–0.4)
Eos: 1 %
Hematocrit: 42.1 % (ref 34.0–46.6)
Hemoglobin: 13.8 g/dL (ref 11.1–15.9)
Immature Grans (Abs): 0 10*3/uL (ref 0.0–0.1)
Immature Granulocytes: 0 %
Lymphocytes Absolute: 1.7 10*3/uL (ref 0.7–3.1)
Lymphs: 34 %
MCH: 30.7 pg (ref 26.6–33.0)
MCHC: 32.8 g/dL (ref 31.5–35.7)
MCV: 94 fL (ref 79–97)
Monocytes Absolute: 0.5 10*3/uL (ref 0.1–0.9)
Monocytes: 10 %
Neutrophils Absolute: 2.7 10*3/uL (ref 1.4–7.0)
Neutrophils: 54 %
Platelets: 253 10*3/uL (ref 150–450)
RBC: 4.49 x10E6/uL (ref 3.77–5.28)
RDW: 12.6 % (ref 11.7–15.4)
WBC: 4.9 10*3/uL (ref 3.4–10.8)

## 2020-08-16 LAB — IRON,TIBC AND FERRITIN PANEL
Ferritin: 93 ng/mL (ref 15–150)
Iron Saturation: 27 % (ref 15–55)
Iron: 106 ug/dL (ref 27–139)
Total Iron Binding Capacity: 392 ug/dL (ref 250–450)
UIBC: 286 ug/dL (ref 118–369)

## 2020-08-16 LAB — BASIC METABOLIC PANEL
BUN/Creatinine Ratio: 18 (ref 12–28)
BUN: 17 mg/dL (ref 8–27)
CO2: 23 mmol/L (ref 20–29)
Calcium: 9.7 mg/dL (ref 8.7–10.3)
Chloride: 102 mmol/L (ref 96–106)
Creatinine, Ser: 0.97 mg/dL (ref 0.57–1.00)
Glucose: 76 mg/dL (ref 65–99)
Potassium: 4.5 mmol/L (ref 3.5–5.2)
Sodium: 140 mmol/L (ref 134–144)
eGFR: 66 mL/min/{1.73_m2} (ref >59)

## 2020-08-17 ENCOUNTER — Telehealth: Payer: Self-pay

## 2020-08-17 NOTE — Telephone Encounter (Signed)
LMTCB for lab results.  

## 2020-09-17 ENCOUNTER — Telehealth: Payer: Self-pay

## 2020-09-17 DIAGNOSIS — F339 Major depressive disorder, recurrent, unspecified: Secondary | ICD-10-CM

## 2020-09-17 DIAGNOSIS — I10 Essential (primary) hypertension: Secondary | ICD-10-CM

## 2020-09-17 MED ORDER — BUPROPION HCL ER (XL) 300 MG PO TB24: 300.0000 mg | ORAL_TABLET | Freq: Every morning | ORAL | 0 refills | Status: DC

## 2020-09-17 NOTE — Telephone Encounter (Signed)
Pt needs a refill on losartan 75 mg and buPROPion (WELLBUTRIN XL) 300 MG 24 hr tablet

## 2020-09-18 MED ORDER — LOSARTAN POTASSIUM 50 MG PO TABS: 50.0000 mg | ORAL_TABLET | Freq: Every day | ORAL | 3 refills | Status: DC

## 2020-09-18 MED ORDER — LOSARTAN POTASSIUM 25 MG PO TABS: 25.0000 mg | ORAL_TABLET | Freq: Every day | ORAL | 1 refills | Status: DC

## 2020-09-18 NOTE — Telephone Encounter (Signed)
Pt called to follow up on the Losartan 75mg  the Pharmacy hasn't received it

## 2020-09-18 NOTE — Addendum Note (Signed)
Addended by: Elise Benne T on: 09/18/2020 08:43 AM   Modules accepted: Orders

## 2020-09-21 ENCOUNTER — Inpatient Hospital Stay: Payer: Managed Care, Other (non HMO) | Admitting: Internal Medicine

## 2020-09-21 ENCOUNTER — Inpatient Hospital Stay: Payer: Managed Care, Other (non HMO)

## 2020-10-18 ENCOUNTER — Encounter: Payer: Self-pay | Admitting: Family

## 2020-12-10 ENCOUNTER — Other Ambulatory Visit: Payer: Self-pay

## 2020-12-10 ENCOUNTER — Encounter: Payer: Self-pay | Admitting: Family

## 2020-12-10 DIAGNOSIS — F339 Major depressive disorder, recurrent, unspecified: Secondary | ICD-10-CM

## 2020-12-10 DIAGNOSIS — I10 Essential (primary) hypertension: Secondary | ICD-10-CM

## 2020-12-10 DIAGNOSIS — R0602 Shortness of breath: Secondary | ICD-10-CM

## 2020-12-10 MED ORDER — LOSARTAN POTASSIUM 50 MG PO TABS: 50.0000 mg | ORAL_TABLET | Freq: Every day | ORAL | 3 refills | Status: DC

## 2020-12-10 MED ORDER — BUPROPION HCL ER (XL) 300 MG PO TB24: 300.0000 mg | ORAL_TABLET | Freq: Every morning | ORAL | 0 refills | Status: DC

## 2020-12-10 MED ORDER — LOSARTAN POTASSIUM 25 MG PO TABS: 25.0000 mg | ORAL_TABLET | Freq: Every day | ORAL | 1 refills | Status: DC

## 2020-12-10 MED ORDER — AZELASTINE HCL 0.1 % NA SOLN: 1.0000 | Freq: Two times a day (BID) | NASAL | 4 refills | Status: DC

## 2020-12-26 ENCOUNTER — Telehealth: Payer: Self-pay

## 2020-12-26 ENCOUNTER — Telehealth: Payer: Managed Care, Other (non HMO) | Admitting: Family Medicine

## 2020-12-26 ENCOUNTER — Other Ambulatory Visit: Payer: Self-pay

## 2020-12-26 ENCOUNTER — Other Ambulatory Visit: Payer: Managed Care, Other (non HMO)

## 2020-12-26 VITALS — Ht 65.0 in | Wt 214.0 lb

## 2020-12-26 DIAGNOSIS — J069 Acute upper respiratory infection, unspecified: Secondary | ICD-10-CM | POA: Diagnosis not present

## 2020-12-26 DIAGNOSIS — R0981 Nasal congestion: Secondary | ICD-10-CM

## 2020-12-26 NOTE — Telephone Encounter (Signed)
I called patient & she had VV with Dr. Birdie Sons today.

## 2020-12-26 NOTE — Telephone Encounter (Signed)
Pt called into access nurse  at 7:39 am asking for antibiotics for sinus symptoms. Pt needs an appointment scheduled to receive medication.

## 2020-12-26 NOTE — Progress Notes (Signed)
Virtual Visit via video Note  This visit type was conducted due to national recommendations for restrictions regarding the COVID-19 pandemic (e.g. social distancing).  This format is felt to be most appropriate for this patient at this time.  All issues noted in this document were discussed and addressed.  No physical exam was performed (except for noted visual exam findings with Video Visits).   I connected with Anna Mcgee today at 10:00 AM EDT by a video enabled telemedicine application and verified that I am speaking with the correct person using two identifiers. Location patient: home Location provider: work  Persons participating in the virtual visit: patient, provider  I discussed the limitations, risks, security and privacy concerns of performing an evaluation and management service by telephone and the availability of in person appointments. I also discussed with the patient that there may be a patient responsible charge related to this service. The patient expressed understanding and agreed to proceed.  Reason for visit: same day visit  HPI: Congestion: Patient notes symptoms started the day prior to this visit.  Started with tiredness and cold chills.  Developed a headache and then some clamminess.  She took Mucinex and Tylenol yesterday and felt some better today though still has congestion in her sinuses.  No postnasal drip, sore throat, cough, shortness of breath, fever, taste or smell disturbances, or body aches.  Does have some headache.  She does take Astelin and Claritin though does not use Flonase.  She have a negative home COVID test.  ROS: See pertinent positives and negatives per HPI.  Past Medical History:  Diagnosis Date   Heart murmur    History of anemia    Hypertension    Postmenopausal bleeding    Vitamin D deficiency    Vitamin D deficiency    Wears dentures    partial lower    Past Surgical History:  Procedure Laterality Date   COLONOSCOPY WITH  PROPOFOL N/A 05/03/2020   Procedure: COLONOSCOPY WITH PROPOFOL;  Surgeon: Toney Reil, MD;  Location: [facility];  Service: Gastroenterology;  Laterality: N/A;   ESOPHAGOGASTRODUODENOSCOPY (EGD) WITH PROPOFOL N/A 05/03/2020   Procedure: ESOPHAGOGASTRODUODENOSCOPY (EGD) WITH PROPOFOL;  Surgeon: Toney Reil, MD;  Location: [facility];  Service: Gastroenterology;  Laterality: N/A;   GASTRIC BYPASS  2006    Family History  Problem Relation Age of Onset   Hypertension Mother    Hypertension Father    Arthritis Maternal Grandmother    Colon cancer Neg Hx     SOCIAL HX: Former smoker   Current Outpatient Medications:    azelastine (ASTELIN) 0.1 % nasal spray, Place 1 spray into both nostrils 2 (two) times daily. Use in each nostril as directed, Disp: 30 mL, Rfl: 4   buPROPion (WELLBUTRIN XL) 300 MG 24 hr tablet, Take 1 tablet (300 mg total) by mouth every morning., Disp: 90 tablet, Rfl: 0   loratadine (CLARITIN) 10 MG tablet, Take 1 tablet (10 mg total) by mouth daily., Disp: 30 tablet, Rfl: 2   losartan (COZAAR) 25 MG tablet, Take 1 tablet (25 mg total) by mouth daily., Disp: 90 tablet, Rfl: 1   losartan (COZAAR) 50 MG tablet, Take 1 tablet (50 mg total) by mouth daily., Disp: 90 tablet, Rfl: 3  EXAM:  VITALS per patient if applicable:  GENERAL: alert, oriented, appears well and in no acute distress  HEENT: atraumatic, conjunttiva clear, no obvious abnormalities on inspection of external nose and ears  NECK: normal movements of  the head and neck  LUNGS: on inspection no signs of respiratory distress, breathing rate appears normal, no obvious gross SOB, gasping or wheezing  CV: no obvious cyanosis  MS: moves all visible extremities without noticeable abnormality  PSYCH/NEURO: pleasant and cooperative, no obvious depression or anxiety, speech and thought processing grossly intact  ASSESSMENT AND PLAN:  Discussed the following assessment and  plan:  Problem List Items Addressed This Visit     Upper respiratory infection    The patient likely has a viral illness.  We will obtain a COVID test to evaluate for COVID-19.  If this is negative this likely represents some other upper respiratory viral illness.  It is unlikely to be related to a bacterial sinus infection at this point in the course of her illness.  She will continue Astelin and Claritin.  She will add Flonase.  If she has worsening symptoms she will let us know.       Other Visit Diagnoses     Sinus congestion    -  Primary   Relevant Orders   Novel Coronavirus, NAA ([facility]) (Completed)       No follow-ups on file.   I discussed the assessment and treatment plan with the patient. The patient was provided an opportunity to ask questions and all were answered. The patient agreed with the plan and demonstrated an understanding of the instructions.   The patient was advised to call back or seek an in-person evaluation if the symptoms worsen or if the condition fails to improve as anticipated.  Marikay Alar, MD

## 2020-12-27 LAB — NOVEL CORONAVIRUS, NAA: SARS-CoV-2, NAA: NOT DETECTED

## 2020-12-27 LAB — SARS-COV-2, NAA 2 DAY TAT

## 2020-12-28 DIAGNOSIS — J069 Acute upper respiratory infection, unspecified: Secondary | ICD-10-CM | POA: Insufficient documentation

## 2020-12-28 NOTE — Telephone Encounter (Signed)
PT called to state the current Claritin is not working and she would like for doxycycline (VIBRA-TABS) 100 MG tablet to be called in as it helped a lot. It was prescribed 04/19/2020 when she saw Dr.TMS and it was very helpful and would like for it to be prescribed again so she can go back to work.

## 2020-12-28 NOTE — Assessment & Plan Note (Signed)
The patient likely has a viral illness.  We will obtain a COVID test to evaluate for COVID-19.  If this is negative this likely represents some other upper respiratory viral illness.  It is unlikely to be related to a bacterial sinus infection at this point in the course of her illness.  She will continue Astelin and Claritin.  She will add Flonase.  If she has worsening symptoms she will let us know.

## 2020-12-28 NOTE — Telephone Encounter (Signed)
Spoken to patient. She is still having the same sx. They feel better after taking otc medication but then sx return. Informed her what Provider stated. She stated she will give it more time and add Flonase to regiment.

## 2020-12-28 NOTE — Telephone Encounter (Signed)
The patient's symptoms have been going on for about 3 days and thus an antibiotic is not indicated at this time as it is unlikely to provide any benefit and will only put her at risk for side effects from the antibiotic such as diarrhea.  She likely has a viral illness that is not related to COVID-19 and it can take 5 to 7 days before patients start to improve during the course of this type of illness.  An antibiotic is not indicated until symptoms have been present for 7 to 10 days without any provement.  She could try adding on Flonase in addition to the Claritin and Astelin.  As always please find out what symptoms she currently has and if they have worsened since I spoke with her on Wednesday.

## 2021-03-07 ENCOUNTER — Other Ambulatory Visit: Payer: Self-pay | Admitting: Family

## 2021-03-07 DIAGNOSIS — F339 Major depressive disorder, recurrent, unspecified: Secondary | ICD-10-CM

## 2021-05-15 ENCOUNTER — Other Ambulatory Visit: Payer: Self-pay | Admitting: Internal Medicine

## 2021-05-15 DIAGNOSIS — E611 Iron deficiency: Secondary | ICD-10-CM

## 2021-06-03 ENCOUNTER — Other Ambulatory Visit: Payer: Self-pay | Admitting: Family

## 2021-06-03 DIAGNOSIS — I10 Essential (primary) hypertension: Secondary | ICD-10-CM

## 2021-06-03 DIAGNOSIS — F339 Major depressive disorder, recurrent, unspecified: Secondary | ICD-10-CM

## 2021-08-02 ENCOUNTER — Ambulatory Visit (INDEPENDENT_AMBULATORY_CARE_PROVIDER_SITE_OTHER): Payer: 59 | Admitting: Family Medicine

## 2021-08-02 ENCOUNTER — Encounter: Payer: Self-pay | Admitting: Family Medicine

## 2021-08-02 ENCOUNTER — Encounter: Payer: Self-pay | Admitting: Internal Medicine

## 2021-08-02 VITALS — Temp 98.0°F

## 2021-08-02 DIAGNOSIS — J302 Other seasonal allergic rhinitis: Secondary | ICD-10-CM

## 2021-08-02 DIAGNOSIS — J069 Acute upper respiratory infection, unspecified: Secondary | ICD-10-CM

## 2021-08-02 NOTE — Progress Notes (Signed)
Virtual Visit via Telephone Note  A paper copy of this note was sent to be scanned as this provider's EHR stopped working during the phone call.  I connected with Anna Mcgee on 08/02/21 at 11:00 AM EST by telephone and verified that I am speaking with the correct person using two identifiers.   I discussed the limitations, risks, security and privacy concerns of performing an evaluation and management service by telephone and the availability of in person appointments. I also discussed with the patient that there may be a patient responsible charge related to this service. The patient expressed understanding and agreed to proceed.  Location patient: home Location provider: work or home office Participants present for the call: patient, provider Patient did not have a visit in the prior 7 days to address this/these issue(s).   History of Present Illness: Pt states she is feeling pretty good but having a "head thing.  States happens every year.  Endorses ears feeling hot at work, rhinorrhea, slightly productive cough with laughing, feeling off balance, and a headache that is since resolved.  Temp 98.5 F on Wednesday.  Denies fever, ear pain/pressure, sore throat, nausea, vomiting, diarrhea, change in appetite, or sick contacts.  Tried Mucinex D and Claritin.  Has not taken a COVID test.  Missed work x2 days.  States may need a note.  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   Observations/Objective: Patient sounds cheerful and well on the phone. I do not appreciate any SOB. Speech and thought processing are grossly intact. Patient reported vitals:  Assessment and Plan: Viral URI -Discussed possible causes including acute nasopharyngitis, influenza, or COVID-19 virus infection -Encouraged to take at home COVID test -OTC meds including Flonase, cough/cold meds for high blood pressure, antihistamines, rest, hydration, etc. -Note given for work -Given strict precautions  Seasonal  allergies -OTC antihistamines and Flonase   Follow Up Instructions:  Follow-up as needed   [id number] [id number] [id number] 11-20 [id number] 21-30 I did not refer this patient for an OV in the next 24 hours for this/these issue(s).  I discussed the assessment and treatment plan with the patient. The patient was provided an opportunity to ask questions and all were answered. The patient agreed with the plan and demonstrated an understanding of the instructions.   The patient was advised to call back or seek an in-person evaluation if the symptoms worsen or if the condition fails to improve as anticipated.  I provided 22:05 minutes of non-face-to-face time during this encounter.   Deeann Saint, MD

## 2021-08-31 ENCOUNTER — Other Ambulatory Visit: Payer: Self-pay | Admitting: Family

## 2021-08-31 DIAGNOSIS — F339 Major depressive disorder, recurrent, unspecified: Secondary | ICD-10-CM

## 2021-12-31 ENCOUNTER — Telehealth: Payer: Self-pay

## 2021-12-31 NOTE — Telephone Encounter (Signed)
Patient states she has made an appointment to see Rennie Plowman, NP, on 02/12/2022, but she will need a refill on the following medications before that appointment:  buPROPion (WELLBUTRIN XL) 300 MG 24 hr tablet  losartan (COZAAR) 50 MG tablet and losartan (COZAAR) 25 MG tablet (patient states she is taking 75 MG of losartan.)  *Patient states her preferred pharmacy is [facility] on the corner of [address] in [place].

## 2022-01-01 ENCOUNTER — Other Ambulatory Visit: Payer: Self-pay | Admitting: Family

## 2022-01-01 ENCOUNTER — Other Ambulatory Visit: Payer: Self-pay

## 2022-01-01 DIAGNOSIS — F339 Major depressive disorder, recurrent, unspecified: Secondary | ICD-10-CM

## 2022-01-01 DIAGNOSIS — I10 Essential (primary) hypertension: Secondary | ICD-10-CM

## 2022-01-01 MED ORDER — LOSARTAN POTASSIUM 25 MG PO TABS: ORAL_TABLET | ORAL | 1 refills | Status: DC

## 2022-01-01 MED ORDER — LOSARTAN POTASSIUM 50 MG PO TABS: 50.0000 mg | ORAL_TABLET | Freq: Every day | ORAL | 1 refills | Status: DC

## 2022-01-01 MED ORDER — BUPROPION HCL ER (XL) 300 MG PO TB24: ORAL_TABLET | ORAL | 1 refills | Status: DC

## 2022-01-01 NOTE — Telephone Encounter (Signed)
Okay to fill losartan and wellbutrin for 30 days plus 1 refill  Please let pt know  Further refill after appt

## 2022-01-02 NOTE — Telephone Encounter (Signed)
Spoke to patient and informed her that refills had been sent in but in order to keep getting refills she has to have an appointment. Patient verbalized understanding.

## 2022-02-12 ENCOUNTER — Encounter: Payer: Self-pay | Admitting: Family

## 2022-02-12 ENCOUNTER — Other Ambulatory Visit: Payer: Self-pay

## 2022-02-12 ENCOUNTER — Ambulatory Visit: Payer: 59 | Admitting: Family

## 2022-02-12 ENCOUNTER — Telehealth: Payer: Self-pay | Admitting: Family

## 2022-02-12 ENCOUNTER — Other Ambulatory Visit: Payer: Self-pay | Admitting: Family

## 2022-02-12 VITALS — BP 136/88 | HR 84 | Temp 97.6°F | Ht 68.0 in

## 2022-02-12 DIAGNOSIS — I10 Essential (primary) hypertension: Secondary | ICD-10-CM

## 2022-02-12 DIAGNOSIS — Z0000 Encounter for general adult medical examination without abnormal findings: Secondary | ICD-10-CM

## 2022-02-12 DIAGNOSIS — F339 Major depressive disorder, recurrent, unspecified: Secondary | ICD-10-CM | POA: Diagnosis not present

## 2022-02-12 DIAGNOSIS — Z1231 Encounter for screening mammogram for malignant neoplasm of breast: Secondary | ICD-10-CM | POA: Diagnosis not present

## 2022-02-12 MED ORDER — LOSARTAN POTASSIUM 25 MG PO TABS: ORAL_TABLET | ORAL | 3 refills | Status: DC

## 2022-02-12 MED ORDER — LOSARTAN POTASSIUM 50 MG PO TABS: ORAL_TABLET | ORAL | 3 refills | Status: DC

## 2022-02-12 MED ORDER — LOSARTAN POTASSIUM 100 MG PO TABS: 100.0000 mg | ORAL_TABLET | Freq: Every day | ORAL | 3 refills | Status: DC

## 2022-02-12 NOTE — Progress Notes (Signed)
Subjective:    Patient ID: Anna Mcgee, female    DOB: [birth year], 61 y.o.   MRN: [id number]  CC: Anna Mcgee is a 61 y.o. female who presents today for follow up.   HPI: Feels well today  No complaints    Depression- compliant with wellbutrin 300mg  and she feels has been helpful for her.  HTN- compliant with losartan 75mg . No cp, sob  HISTORY:  Past Medical History:  Diagnosis Date   Heart murmur    History of anemia    Hypertension    Postmenopausal bleeding    Vitamin D deficiency    Vitamin D deficiency    Wears dentures    partial lower   Past Surgical History:  Procedure Laterality Date   COLONOSCOPY WITH PROPOFOL N/A 05/03/2020   Procedure: COLONOSCOPY WITH PROPOFOL;  Surgeon: , MD;  Location: [facility];  Service: Gastroenterology;  Laterality: N/A;   ESOPHAGOGASTRODUODENOSCOPY (EGD) WITH PROPOFOL N/A 05/03/2020   Procedure: ESOPHAGOGASTRODUODENOSCOPY (EGD) WITH PROPOFOL;  Surgeon: [facility], MD;  Location: [facility];  Service: Gastroenterology;  Laterality: N/A;   GASTRIC BYPASS  2006   Family History  Problem Relation Age of Onset   Hypertension Mother    Hypertension Father    Arthritis Maternal Grandmother    Colon cancer Neg Hx     Allergies: Amlodipine and Penicillins Current Outpatient Medications on File Prior to Visit  Medication Sig Dispense Refill   azelastine (ASTELIN) 0.1 % nasal spray Place 1 spray into both nostrils 2 (two) times daily. Use in each nostril as directed 30 mL 4   buPROPion (WELLBUTRIN XL) 300 MG 24 hr tablet TAKE 1 TABLET(300 MG) BY MOUTH EVERY MORNING 30 tablet 1   loratadine (CLARITIN) 10 MG tablet Take 1 tablet (10 mg total) by mouth daily. 30 tablet 2   losartan (COZAAR) 25 MG tablet TAKE 1 TABLET(25 MG) BY MOUTH DAILY 30 tablet 1   No current facility-administered medications on file prior to visit.    Social History   Tobacco Use   Smoking status: Former     Types: Cigarettes    Quit date: 08/01/2010    Years since quitting: 11.5   Smokeless tobacco: Never   Tobacco comments:    Vapes  Vaping Use   Vaping Use: Every day   Substances: Nicotine   Devices: Logic  Substance Use Topics   Alcohol use: Yes    Alcohol/week: 0.0 standard drinks of alcohol    Comment: Rare   Drug use: No    Review of Systems  Constitutional:  Negative for chills and fever.  Respiratory:  Negative for cough.   Cardiovascular:  Negative for chest pain and palpitations.  Gastrointestinal:  Negative for nausea and vomiting.      Objective:    BP 136/88 (BP Location: Left Arm, Patient Position: Sitting, Cuff Size: Large)   Pulse 84   Temp 97.6 F (36.4 C) (Oral)   Ht 5\' 8"  (1.727 m)   SpO2 (!) 84%   BMI 32.54 kg/m  BP Readings from Last 3 Encounters:  02/12/22 136/88  08/06/20 134/60  05/18/20 (!) 145/80   Wt Readings from Last 3 Encounters:  12/26/20 214 lb (97.1 kg)  06/04/20 215 lb (97.5 kg)  05/18/20 215 lb 12.8 oz (97.9 kg)    Physical Exam Vitals reviewed.  Constitutional:      Appearance: She is well-developed.  Eyes:     Conjunctiva/sclera: Conjunctivae normal.  Cardiovascular:  Rate and Rhythm: Normal rate and regular rhythm.     Pulses: Normal pulses.     Heart sounds: Normal heart sounds.  Pulmonary:     Effort: Pulmonary effort is normal.     Breath sounds: Normal breath sounds. No wheezing, rhonchi or rales.  Skin:    General: Skin is warm and dry.  Neurological:     Mental Status: She is alert.  Psychiatric:        Speech: Speech normal.        Behavior: Behavior normal.        Thought Content: Thought content normal.        Assessment & Plan:   Problem List Items Addressed This Visit       Cardiovascular and Mediastinum   Hypertension    Suboptimal control. Discussed goal of BP < 120/80. For medication ease as well, we agreed that taking losartan 100mg  is prudent. She will let me know how she is doing         Other   Depression, recurrent (HCC)    Chronic, stable. Continue wellbutrin 300mg       Routine physical examination   Relevant Orders   CBC with Differential/Platelet   Comprehensive metabolic panel   Hemoglobin A1c   Lipid panel   TSH   VITAMIN D 25 Hydroxy (Vit-D Deficiency, Fractures)   Other Visit Diagnoses     Encounter for screening mammogram for malignant neoplasm of breast    -  Primary   Relevant Orders   MM 3D SCREEN BREAST BILATERAL        I am having Anna Mcgee "[name]" maintain her loratadine, azelastine, buPROPion, and losartan.   No orders of the defined types were placed in this encounter.   Return precautions given.   Risks, benefits, and alternatives of the medications and treatment plan prescribed today were discussed, and patient expressed understanding.   Education regarding symptom management and diagnosis given to patient on AVS.  Continue to follow with , FNP for routine health maintenance.   and I agreed with plan.   Allegra Grana, FNP

## 2022-02-12 NOTE — Telephone Encounter (Signed)
When your computer froze, I had planned to increase losartan to 100mg . Please send in losartan 100mg  tablet to take once daily

## 2022-02-12 NOTE — Assessment & Plan Note (Signed)
Suboptimal control. Discussed goal of BP < 120/80. For medication ease as well, we agreed that taking losartan 100mg  is prudent. She will let me know how she is doing

## 2022-02-12 NOTE — Assessment & Plan Note (Signed)
Chronic, stable. Continue wellbutrin 300mg ?

## 2022-02-12 NOTE — Progress Notes (Signed)
Patient refused weighing. Needs refills on LOSARTAN AND  BUPROPRION. Having left ankle swelling also

## 2022-02-12 NOTE — Patient Instructions (Signed)
Labs one week after increase of losartan 100mg   Nice to see you!

## 2022-02-13 ENCOUNTER — Other Ambulatory Visit: Payer: Self-pay

## 2022-02-13 NOTE — Telephone Encounter (Signed)
Already ordered and sent!

## 2022-02-14 ENCOUNTER — Other Ambulatory Visit: Payer: Self-pay

## 2022-02-14 ENCOUNTER — Telehealth: Payer: Self-pay

## 2022-02-14 DIAGNOSIS — F339 Major depressive disorder, recurrent, unspecified: Secondary | ICD-10-CM

## 2022-02-14 MED ORDER — BUPROPION HCL ER (XL) 300 MG PO TB24: ORAL_TABLET | ORAL | 3 refills | Status: DC

## 2022-02-14 NOTE — Telephone Encounter (Signed)
Patient states she requested a refill on two of her medications, losartan (COZAAR) 100 MG tablet and buPROPion (WELLBUTRIN XL) 300 MG 24 hr tablet.  Patient states the pharmacy only received one of them.  Patient states she still needs to have a refill for her buPROPion (WELLBUTRIN XL) 300 MG 24 hr tablet.  *Patient states her preferred pharmacy is [facility] on the corner of [address] and [address].

## 2022-02-14 NOTE — Telephone Encounter (Signed)
Refill sent patient is aware. 

## 2022-03-11 ENCOUNTER — Encounter: Payer: Self-pay | Admitting: Family

## 2022-03-11 ENCOUNTER — Ambulatory Visit (INDEPENDENT_AMBULATORY_CARE_PROVIDER_SITE_OTHER): Payer: 59 | Admitting: Family

## 2022-03-11 ENCOUNTER — Other Ambulatory Visit (HOSPITAL_COMMUNITY)
Admission: RE | Admit: 2022-03-11 | Discharge: 2022-03-11 | Disposition: A | Discharge status: Home / Self Care | Payer: 59 | Source: Ambulatory Visit | Attending: Family | Admitting: Family

## 2022-03-11 VITALS — BP 140/92 | HR 73 | Temp 98.2°F | Ht 66.0 in | Wt 217.0 lb

## 2022-03-11 DIAGNOSIS — Z0000 Encounter for general adult medical examination without abnormal findings: Secondary | ICD-10-CM | POA: Insufficient documentation

## 2022-03-11 DIAGNOSIS — Z23 Encounter for immunization: Secondary | ICD-10-CM | POA: Diagnosis not present

## 2022-03-11 DIAGNOSIS — L819 Disorder of pigmentation, unspecified: Secondary | ICD-10-CM | POA: Diagnosis not present

## 2022-03-11 DIAGNOSIS — I10 Essential (primary) hypertension: Secondary | ICD-10-CM | POA: Diagnosis not present

## 2022-03-11 LAB — CBC WITH DIFFERENTIAL/PLATELET
Basophils Absolute: 0 10*3/uL (ref 0.0–0.2)
Basos: 1 %
EOS (ABSOLUTE): 0.1 10*3/uL (ref 0.0–0.4)
Eos: 1 %
Hematocrit: 35.5 % (ref 34.0–46.6)
Hemoglobin: 11.6 g/dL (ref 11.1–15.9)
Immature Grans (Abs): 0 10*3/uL (ref 0.0–0.1)
Immature Granulocytes: 0 %
Lymphocytes Absolute: 2.1 10*3/uL (ref 0.7–3.1)
Lymphs: 32 %
MCH: 29.4 pg (ref 26.6–33.0)
MCHC: 32.7 g/dL (ref 31.5–35.7)
MCV: 90 fL (ref 79–97)
Monocytes Absolute: 0.5 10*3/uL (ref 0.1–0.9)
Monocytes: 7 %
Neutrophils Absolute: 3.8 10*3/uL (ref 1.4–7.0)
Neutrophils: 59 %
Platelets: 265 10*3/uL (ref 150–450)
RBC: 3.94 x10E6/uL (ref 3.77–5.28)
RDW: 12.1 % (ref 11.7–15.4)
WBC: 6.5 10*3/uL (ref 3.4–10.8)

## 2022-03-11 LAB — COMPREHENSIVE METABOLIC PANEL
ALT: 20 IU/L (ref 0–32)
AST: 31 IU/L (ref 0–40)
Albumin/Globulin Ratio: 1.8 (ref 1.2–2.2)
Albumin: 4.4 g/dL (ref 3.9–4.9)
Alkaline Phosphatase: 75 IU/L (ref 44–121)
BUN/Creatinine Ratio: 16 (ref 12–28)
BUN: 16 mg/dL (ref 8–27)
Bilirubin Total: <0.2 mg/dL (ref 0.0–1.2)
CO2: 19 mmol/L — ABNORMAL LOW (ref 20–29)
Calcium: 9 mg/dL (ref 8.7–10.3)
Chloride: 104 mmol/L (ref 96–106)
Creatinine, Ser: 0.97 mg/dL (ref 0.57–1.00)
Globulin, Total: 2.5 g/dL (ref 1.5–4.5)
Glucose: 81 mg/dL (ref 70–99)
Potassium: 4.1 mmol/L (ref 3.5–5.2)
Sodium: 138 mmol/L (ref 134–144)
Total Protein: 6.9 g/dL (ref 6.0–8.5)
eGFR: 66 mL/min/{1.73_m2} (ref >59)

## 2022-03-11 LAB — LIPID PANEL
Chol/HDL Ratio: 1.5 ratio (ref 0.0–4.4)
Cholesterol, Total: 170 mg/dL (ref 100–199)
HDL: 116 mg/dL (ref >39)
LDL Chol Calc (NIH): 44 mg/dL (ref 0–99)
Triglycerides: 45 mg/dL (ref 0–149)
VLDL Cholesterol Cal: 10 mg/dL (ref 5–40)

## 2022-03-11 LAB — VITAMIN D 25 HYDROXY (VIT D DEFICIENCY, FRACTURES): Vit D, 25-Hydroxy: 29.5 ng/mL — ABNORMAL LOW (ref 30.0–100.0)

## 2022-03-11 LAB — TSH: TSH: 0.532 u[IU]/mL (ref 0.450–4.500)

## 2022-03-11 LAB — HEMOGLOBIN A1C
Est. average glucose Bld gHb Est-mCnc: 120 mg/dL
Hgb A1c MFr Bld: 5.8 % — ABNORMAL HIGH (ref 4.8–5.6)

## 2022-03-11 MED ORDER — HYDROCHLOROTHIAZIDE 25 MG PO TABS: 12.5000 mg | ORAL_TABLET | Freq: Every day | ORAL | 3 refills | Status: DC

## 2022-03-11 NOTE — Assessment & Plan Note (Addendum)
Clinical breast exam and Pap smear performed today.  Due to patient's smoking history, referred her to [facility] pulmonology for evaluation of lung cancer screening program.  Encouraged walking program.  Of note, discussed labs.  We discussed prediabetes and patient has appointment with [facility] weight loss clinic in [place].  She declines nutrition consult at this time.  Advised her to start vitamin D 800 units daily.

## 2022-03-11 NOTE — Assessment & Plan Note (Addendum)
Reviewed prior blood pressure which have been slightly elevated. Discussed BP goal < 120/80.  We agreed to start hctz 12.5mg  in addition to losartan 100mg . BMP in one week.

## 2022-03-11 NOTE — Progress Notes (Signed)
Subjective:    Patient ID: Anna Mcgee, female    DOB: Jun 10, 1961, 61 y.o.   MRN: [id number]  CC: Anna Mcgee is a 61 y.o. female who presents today for physical exam.    HPI: Tearful today as she read her A1c last night on Mychart and was upset about her blood sugar.  She is very very fearful of developing diabetes.    She remains compliant with losartan 100 mg.  She is also noted self-limiting episodes that occur 'only when it is cold outside' in which bilateral hands she will noticed white color on her fingertips.  Episodes are not painful.  She has not seen any blue or red discoloration.  No numbness      Colorectal Cancer Screening: UTD , 05/03/2020 with Dr. Marius Ditch.  Repeat in 10 years Breast Cancer Screening: Mammogram due Cervical Cancer Screening: due, 03/29/2017 negative HPV , malignancy Bone Health screening/DEXA for 65+: No increased fracture risk. Defer screening at this time.  Lung Cancer Screening:former smoker.   AAA screen for all men and women aged 16 to 20 with h/o tobacco, men 66 years older with family h/o AAA and women 47 years or older who have ever smoked or have family history of AAA.        Tetanus - UTD        Pneumococcal - Candidate for. Will defer to follow up as getting shingrex today   Exercise: No regular exercise.   Alcohol use:  rare Smoking/tobacco use: vapes; quit cigarette smoking in 2012. Started smoking around 18.    HISTORY:  Past Medical History:  Diagnosis Date   Heart murmur    History of anemia    Hypertension    Postmenopausal bleeding    Vitamin D deficiency    Vitamin D deficiency    Wears dentures    partial lower    Past Surgical History:  Procedure Laterality Date   COLONOSCOPY WITH PROPOFOL N/A 05/03/2020   Procedure: COLONOSCOPY WITH PROPOFOL;  Surgeon: Lin Landsman, MD;  Location: [facility];  Service: Gastroenterology;  Laterality: N/A;   ESOPHAGOGASTRODUODENOSCOPY (EGD) WITH PROPOFOL N/A  05/03/2020   Procedure: ESOPHAGOGASTRODUODENOSCOPY (EGD) WITH PROPOFOL;  Surgeon: Lin Landsman, MD;  Location: [facility];  Service: Gastroenterology;  Laterality: N/A;   GASTRIC BYPASS  2006   Family History  Problem Relation Age of Onset   Hypertension Mother    Hypertension Father    Arthritis Maternal Grandmother    Colon cancer Neg Hx       ALLERGIES: Amlodipine and Penicillins  Current Outpatient Medications on File Prior to Visit  Medication Sig Dispense Refill   buPROPion (WELLBUTRIN XL) 300 MG 24 hr tablet TAKE 1 TABLET(300 MG) BY MOUTH EVERY MORNING 90 tablet 3   losartan (COZAAR) 100 MG tablet Take 1 tablet (100 mg total) by mouth daily. 90 tablet 3   azelastine (ASTELIN) 0.1 % nasal spray Place 1 spray into both nostrils 2 (two) times daily. Use in each nostril as directed (Patient not taking: Reported on 03/11/2022) 30 mL 4   loratadine (CLARITIN) 10 MG tablet Take 1 tablet (10 mg total) by mouth daily. 30 tablet 2   No current facility-administered medications on file prior to visit.    Social History   Tobacco Use   Smoking status: Former    Types: Cigarettes    Quit date: 08/01/2010    Years since quitting: 11.6   Smokeless tobacco: Never   Tobacco  comments:    Vapes  Vaping Use   Vaping Use: Every day   Substances: Nicotine   Devices: Logic  Substance Use Topics   Alcohol use: Yes    Alcohol/week: 0.0 standard drinks of alcohol    Comment: Rare   Drug use: No    Review of Systems  Constitutional:  Negative for chills, fever and unexpected weight change.  HENT:  Negative for congestion.   Respiratory:  Negative for cough.   Cardiovascular:  Negative for chest pain, palpitations and leg swelling.  Gastrointestinal:  Negative for nausea and vomiting.  Musculoskeletal:  Negative for arthralgias and myalgias.  Skin:  Negative for rash.  Neurological:  Negative for headaches.  Hematological:  Negative for adenopathy.   Psychiatric/Behavioral:  Negative for confusion.       Objective:    BP (!) 140/92 (BP Location: Left Arm, Patient Position: Sitting, Cuff Size: Normal)   Pulse 73   Temp 98.2 F (36.8 C) (Oral)   Ht 5\' 6"  (1.676 m)   Wt 217 lb (98.4 kg)   SpO2 99%   BMI 35.02 kg/m   BP Readings from Last 3 Encounters:  03/11/22 (!) 140/92  02/12/22 136/88  08/06/20 134/60   Wt Readings from Last 3 Encounters:  03/11/22 217 lb (98.4 kg)  12/26/20 214 lb (97.1 kg)  06/04/20 215 lb (97.5 kg)    Physical Exam Vitals reviewed.  Constitutional:      Appearance: Normal appearance. She is well-developed.  Eyes:     Conjunctiva/sclera: Conjunctivae normal.  Neck:     Thyroid: No thyroid mass or thyromegaly.  Cardiovascular:     Rate and Rhythm: Normal rate and regular rhythm.     Pulses: Normal pulses.     Heart sounds: Normal heart sounds.     Comments: Palpable radial pulses. No discoloration of fingers. Hands are warm.  Pulmonary:     Effort: Pulmonary effort is normal.     Breath sounds: Normal breath sounds. No wheezing, rhonchi or rales.  Chest:  Breasts:    Breasts are symmetrical.     Right: No inverted nipple, mass, nipple discharge, skin change or tenderness.     Left: No inverted nipple, mass, nipple discharge, skin change or tenderness.  Abdominal:     General: Bowel sounds are normal. There is no distension.     Palpations: Abdomen is soft. Abdomen is not rigid. There is no fluid wave or mass.     Tenderness: There is no abdominal tenderness. There is no guarding or rebound.  Genitourinary:    Cervix: No cervical motion tenderness, discharge or friability.     Uterus: Not enlarged, not fixed and not tender.      Adnexa:        Right: No mass, tenderness or fullness.         Left: No mass, tenderness or fullness.       Comments: Pap performed. No CMT. Unable to appreciated ovaries. Lymphadenopathy:     Head:     Right side of head: No submental, submandibular,  tonsillar, preauricular, posterior auricular or occipital adenopathy.     Left side of head: No submental, submandibular, tonsillar, preauricular, posterior auricular or occipital adenopathy.     Cervical:     Right cervical: No superficial, deep or posterior cervical adenopathy.    Left cervical: No superficial, deep or posterior cervical adenopathy.     Upper Body:     Right upper body: No pectoral adenopathy.  Left upper body: No pectoral adenopathy.  Skin:    General: Skin is warm and dry.  Neurological:     Mental Status: She is alert.  Psychiatric:        Speech: Speech normal.        Behavior: Behavior normal.        Thought Content: Thought content normal.        Assessment & Plan:   Problem List Items Addressed This Visit       Cardiovascular and Mediastinum   HTN (hypertension)   Relevant Medications   hydrochlorothiazide (HYDRODIURIL) 25 MG tablet   Other Relevant Orders   Basic metabolic panel   Hypertension    Reviewed prior blood pressure which have been slightly elevated. Discussed BP goal < 120/80.  We agreed to start hctz 12.5mg  in addition to losartan 100mg . BMP in one week.       Relevant Medications   hydrochlorothiazide (HYDRODIURIL) 25 MG tablet   Other Relevant Orders   Basic metabolic panel     Musculoskeletal and Integument   Discoloration of skin of finger    Benign circulation exam today.  Discussed with her my suspicion for Raynaud's phenomenon.  Discussed if became persistent or  painful to let me know as at that time would warrant  trial of a CCB.         Other   Routine physical examination - Primary    Clinical breast exam and Pap smear performed today.  Due to patient's smoking history, referred her to [facility] pulmonology for evaluation of lung cancer screening program.  Encouraged walking program.  Of note, discussed labs.  We discussed prediabetes and patient has appointment with [facility] in [place].  She  declines nutrition consult at this time.  Advised her to start vitamin D 800 units daily.      Relevant Orders   Ambulatory Referral for Lung Cancer Scre   Cytology - PAP   Other Visit Diagnoses     Need for shingles vaccine       Relevant Orders   Zoster Recombinant (Shingrix ) (Completed)        I am having Bunnell A. Aldea "[name]" start on hydrochlorothiazide. I am also having her maintain her loratadine, azelastine, losartan, and buPROPion.   Meds ordered this encounter  Medications   hydrochlorothiazide (HYDRODIURIL) 25 MG tablet    Sig: Take 0.5 tablets (12.5 mg total) by mouth daily.    Dispense:  90 tablet    Refill:  3    Order Specific Question:   Supervising Provider    Answer:   Crecencio Mc [[id number]]    Return precautions given.   Risks, benefits, and alternatives of the medications and treatment plan prescribed today were discussed, and patient expressed understanding.   Education regarding symptom management and diagnosis given to patient on AVS.   Continue to follow with Burnard Hawthorne, FNP for routine health maintenance.   Anna Mcgee and I agreed with plan.   Mable Paris, FNP

## 2022-03-11 NOTE — Patient Instructions (Addendum)
Vitamin D is slightly low. Please start cholecalciferol 800 units daily for next 3 to 4 months. You may find this over the counter in the drug store. Please call to make an appointment for 3 to 4 months out so we can recheck. Also, please ensure you are following a diet high in calcium -- research shows better outcomes with dietary sources including kale, yogurt, broccolii, cheese, okra, almonds- to name a few.      Please call  and schedule your 3D mammogram and /or bone density scan as we discussed.   [facility]  ( new location in 2023)  [address], [place], [place] [address]  [place], [place]  Call [employer] and let them know we have ordered CT chest lung cancer screen as recommended by CDC.   I have placed referral to [facility] pulmonology Let us know if you dont hear back within a week in regards to an appointment being scheduled for screening phone call.    [email or web address]   As discussed, suspect raynaud's phenomenon/syndrome  in winter time. If persistent, painful, let me know. We can consider amlodipine.   Start hctz for blood pressure. Labs in  one week to check electrolytes  It is imperative that you are seen AT least twice per year for labs and monitoring. Monitor blood pressure at home and me 5-6 reading on separate days. Goal is less than 120/80, based on newest guidelines, however we certainly want to be less than 130/80;  if persistently higher, please make sooner follow up appointment so we can recheck you blood pressure and manage/ adjust medications.    Health Maintenance for Postmenopausal Women Menopause is a normal process in which your ability to get pregnant comes to an end. This process happens slowly over many months or years, usually between the ages of 36 and 20. Menopause is complete when you have missed your menstrual period for 12 months. It is important to talk with your  health care provider about some of the most common conditions that affect women after menopause (postmenopausal women). These include heart disease, cancer, and bone loss (osteoporosis). Adopting a healthy lifestyle and getting preventive care can help to promote your health and wellness. The actions you take can also lower your chances of developing some of these common conditions. What are the signs and symptoms of menopause? During menopause, you may have the following symptoms: Hot flashes. These can be moderate or severe. Night sweats. Decrease in sex drive. Mood swings. Headaches. Tiredness (fatigue). Irritability. Memory problems. Problems falling asleep or staying asleep. Talk with your health care provider about treatment options for your symptoms. Do I need hormone replacement therapy? Hormone replacement therapy is effective in treating symptoms that are caused by menopause, such as hot flashes and night sweats. Hormone replacement carries certain risks, especially as you become older. If you are thinking about using estrogen or estrogen with progestin, discuss the benefits and risks with your health care provider. How can I reduce my risk for heart disease and stroke? The risk of heart disease, heart attack, and stroke increases as you age. One of the causes may be a change in the body's hormones during menopause. This can affect how your body uses dietary fats, triglycerides, and cholesterol. Heart attack and stroke are medical emergencies. There are many things that you can do to help prevent heart disease and stroke. Watch your blood pressure High blood pressure causes heart disease and increases the risk of  stroke. This is more likely to develop in people who have high blood pressure readings or are overweight. Have your blood pressure checked: Every 3-5 years if you are 33-20 years of age. Every year if you are 77 years old or older. Eat a healthy diet  Eat a diet that  includes plenty of vegetables, fruits, low-fat dairy products, and lean protein. Do not eat a lot of foods that are high in solid fats, added sugars, or sodium. Get regular exercise Get regular exercise. This is one of the most important things you can do for your health. Most adults should: Try to exercise for at least 150 minutes each week. The exercise should increase your heart rate and make you sweat (moderate-intensity exercise). Try to do strengthening exercises at least twice each week. Do these in addition to the moderate-intensity exercise. Spend less time sitting. Even light physical activity can be beneficial. Other tips Work with your health care provider to achieve or maintain a healthy weight. Do not use any products that contain nicotine or tobacco. These products include cigarettes, chewing tobacco, and vaping devices, such as e-cigarettes. If you need help quitting, ask your health care provider. Know your numbers. Ask your health care provider to check your cholesterol and your blood sugar (glucose). Continue to have your blood tested as directed by your health care provider. Do I need screening for cancer? Depending on your health history and family history, you may need to have cancer screenings at different stages of your life. This may include screening for: Breast cancer. Cervical cancer. Lung cancer. Colorectal cancer. What is my risk for osteoporosis? After menopause, you may be at increased risk for osteoporosis. Osteoporosis is a condition in which bone destruction happens more quickly than new bone creation. To help prevent osteoporosis or the bone fractures that can happen because of osteoporosis, you may take the following actions: If you are 74-25 years old, get at least 1,000 mg of calcium and at least 600 international units (IU) of vitamin D per day. If you are older than age 3 but younger than age 29, get at least 1,200 mg of calcium and at least 600  international units (IU) of vitamin D per day. If you are older than age 7, get at least 1,200 mg of calcium and at least 800 international units (IU) of vitamin D per day. Smoking and drinking excessive alcohol increase the risk of osteoporosis. Eat foods that are rich in calcium and vitamin D, and do weight-bearing exercises several times each week as directed by your health care provider. How does menopause affect my mental health? Depression may occur at any age, but it is more common as you become older. Common symptoms of depression include: Feeling depressed. Changes in sleep patterns. Changes in appetite or eating patterns. Feeling an overall lack of motivation or enjoyment of activities that you previously enjoyed. Frequent crying spells. Talk with your health care provider if you think that you are experiencing any of these symptoms. General instructions See your health care provider for regular wellness exams and vaccines. This may include: Scheduling regular health, dental, and eye exams. Getting and maintaining your vaccines. These include: Influenza vaccine. Get this vaccine each year before the flu season begins. Pneumonia vaccine. Shingles vaccine. Tetanus, diphtheria, and pertussis (Tdap) booster vaccine. Your health care provider may also recommend other immunizations. Tell your health care provider if you have ever been abused or do not feel safe at home. Summary Menopause is a normal  process in which your ability to get pregnant comes to an end. This condition causes hot flashes, night sweats, decreased interest in sex, mood swings, headaches, or lack of sleep. Treatment for this condition may include hormone replacement therapy. Take actions to keep yourself healthy, including exercising regularly, eating a healthy diet, watching your weight, and checking your blood pressure and blood sugar levels. Get screened for cancer and depression. Make sure that you are up to  date with all your vaccines. This information is not intended to replace advice given to you by your health care provider. Make sure you discuss any questions you have with your health care provider. Document Revised: 10/22/2020 Document Reviewed: 10/22/2020 Elsevier Patient Education  [employer].

## 2022-03-11 NOTE — Assessment & Plan Note (Signed)
Benign circulation exam today.  Discussed with her my suspicion for Raynaud's phenomenon.  Discussed if became persistent or  painful to let me know as at that time would warrant  trial of a CCB.

## 2022-03-17 LAB — CYTOLOGY - PAP
Comment: NEGATIVE
Diagnosis: NEGATIVE
High risk HPV: NEGATIVE

## 2022-03-31 ENCOUNTER — Encounter: Payer: Self-pay | Admitting: Family

## 2022-04-01 NOTE — Telephone Encounter (Signed)
Spoke to patient and scheduled MCVV for 04/02/22

## 2022-04-02 ENCOUNTER — Telehealth: Payer: 59 | Admitting: Family

## 2022-04-02 ENCOUNTER — Encounter: Payer: Self-pay | Admitting: Family

## 2022-04-02 VITALS — Ht 66.0 in | Wt 217.0 lb

## 2022-04-02 DIAGNOSIS — R8290 Unspecified abnormal findings in urine: Secondary | ICD-10-CM

## 2022-04-02 DIAGNOSIS — I10 Essential (primary) hypertension: Secondary | ICD-10-CM | POA: Diagnosis not present

## 2022-04-02 NOTE — Assessment & Plan Note (Signed)
Fortunately no urinary symptoms at this time.  Urine studies have been ordered

## 2022-04-02 NOTE — Progress Notes (Signed)
Virtual Visit via Video Note  I connected with@  on 04/02/22 at 12:00 PM EDT by a video enabled telemedicine application and verified that I am speaking with the correct person using two identifiers.  Location patient: home Location provider:work  Persons participating in the virtual visit: patient, provider  I discussed the limitations of evaluation and management by telemedicine and the availability of in person appointments. The patient expressed understanding and agreed to proceed.   HPI: Follow up abnormal urine   [facility] presented to [facility] for baseline medical evaluation 03/29/2022 and performed a urine test which states it was abnormal, positive for blood and protein.   She started hctz 12.5mg  3 weeks ago. Compliant with losartan 100mg . She will have BP rechecked this weekend [facility].   Denies dysuria, urinary frequency,   ROS: See pertinent positives and negatives per HPI.    EXAM:  VITALS per patient if applicable: Ht 5\' 6"  (1.676 m)   Wt 217 lb (98.4 kg)   BMI 35.02 kg/m  BP Readings from Last 3 Encounters:  03/11/22 (!) 140/92  02/12/22 136/88  08/06/20 134/60   Wt Readings from Last 3 Encounters:  04/02/22 217 lb (98.4 kg)  03/11/22 217 lb (98.4 kg)  12/26/20 214 lb (97.1 kg)    GENERAL: alert, oriented, appears well and in no acute distress  HEENT: atraumatic, conjunttiva clear, no obvious abnormalities on inspection of external nose and ears  NECK: normal movements of the head and neck  LUNGS: on inspection no signs of respiratory distress, breathing rate appears normal, no obvious gross SOB, gasping or wheezing  CV: no obvious cyanosis  MS: moves all visible extremities without noticeable abnormality  PSYCH/NEURO: pleasant and cooperative, no obvious depression or anxiety, speech and thought processing grossly intact  ASSESSMENT AND PLAN:  Discussed the following assessment and plan:  Problem List Items Addressed This  Visit       Cardiovascular and Mediastinum   HTN (hypertension)    Anticipate improvement as she has been taking hydrochlorothiazide 12.5 mg now with losartan 100 mg.  She will spot check her blood pressure and let me know if not at goal.      Relevant Orders   Basic metabolic panel     Other   Abnormal urine - Primary    Fortunately no urinary symptoms at this time.  Urine studies have been ordered      Relevant Orders   Urinalysis, Routine w reflex microscopic   Urine Culture    -we discussed possible serious and likely etiologies, options for evaluation and workup, limitations of telemedicine visit vs in person visit, treatment, treatment risks and precautions. Pt prefers to treat via telemedicine empirically rather then risking or undertaking an in person visit at this moment.  .   I discussed the assessment and treatment plan with the patient. The patient was provided an opportunity to ask questions and all were answered. The patient agreed with the plan and demonstrated an understanding of the instructions.   The patient was advised to call back or seek an in-person evaluation if the symptoms worsen or if the condition fails to improve as anticipated.   Mable Paris, FNP

## 2022-04-02 NOTE — Patient Instructions (Signed)
Please have lab and urine done at [facility]

## 2022-04-02 NOTE — Assessment & Plan Note (Signed)
Anticipate improvement as she has been taking hydrochlorothiazide 12.5 mg now with losartan 100 mg.  She will spot check her blood pressure and let me know if not at goal.

## 2022-04-03 NOTE — Telephone Encounter (Signed)
Pt called stating she does not see her labs in the [facility] system

## 2022-04-03 NOTE — Addendum Note (Signed)
Addended by: [place], Silas Sedam on: 04/03/2022 09:41 AM   Modules accepted: Orders

## 2022-04-04 ENCOUNTER — Telehealth: Payer: Self-pay | Admitting: Family

## 2022-04-04 ENCOUNTER — Encounter: Payer: Self-pay | Admitting: Family

## 2022-04-04 LAB — BASIC METABOLIC PANEL
BUN/Creatinine Ratio: 29 — ABNORMAL HIGH (ref 12–28)
BUN: 29 mg/dL — ABNORMAL HIGH (ref 8–27)
CO2: 23 mmol/L (ref 20–29)
Calcium: 9.7 mg/dL (ref 8.7–10.3)
Chloride: 99 mmol/L (ref 96–106)
Creatinine, Ser: 1.01 mg/dL — ABNORMAL HIGH (ref 0.57–1.00)
Glucose: 83 mg/dL (ref 70–99)
Potassium: 4.2 mmol/L (ref 3.5–5.2)
Sodium: 139 mmol/L (ref 134–144)
eGFR: 63 mL/min/{1.73_m2} (ref >59)

## 2022-04-04 LAB — URINALYSIS, ROUTINE W REFLEX MICROSCOPIC
Bilirubin, UA: NEGATIVE
Glucose, UA: NEGATIVE
Ketones, UA: NEGATIVE
Nitrite, UA: NEGATIVE
Protein,UA: NEGATIVE
RBC, UA: NEGATIVE
Specific Gravity, UA: 1.016 (ref 1.005–1.030)
Urobilinogen, Ur: 0.2 mg/dL (ref 0.2–1.0)
pH, UA: 6 (ref 5.0–7.5)

## 2022-04-04 LAB — MICROSCOPIC EXAMINATION
Casts: NONE SEEN /lpf
Epithelial Cells (non renal): NONE SEEN /hpf (ref 0–10)
RBC, Urine: NONE SEEN /hpf (ref 0–2)

## 2022-04-04 NOTE — Telephone Encounter (Signed)
Can we add on urine culture from yesterday?  It doesn't appear collected

## 2022-04-07 LAB — URINE CULTURE

## 2022-04-07 NOTE — Telephone Encounter (Signed)
Spoke to pt and explained to her that her results from culture has not yet resulted but will call her when they do. Pt verbalized understanding

## 2022-04-08 ENCOUNTER — Other Ambulatory Visit: Payer: Self-pay | Admitting: Family

## 2022-04-08 ENCOUNTER — Encounter: Payer: Self-pay | Admitting: Family

## 2022-04-08 DIAGNOSIS — R8290 Unspecified abnormal findings in urine: Secondary | ICD-10-CM

## 2022-04-11 ENCOUNTER — Other Ambulatory Visit: Payer: Self-pay | Admitting: Family

## 2022-04-11 ENCOUNTER — Encounter: Payer: Self-pay | Admitting: Family

## 2022-04-11 DIAGNOSIS — L819 Disorder of pigmentation, unspecified: Secondary | ICD-10-CM

## 2022-04-11 MED ORDER — AMLODIPINE BESYLATE 5 MG PO TABS: 2.5000 mg | ORAL_TABLET | Freq: Every day | ORAL | 1 refills | Status: DC

## 2022-04-11 NOTE — Progress Notes (Signed)
close

## 2022-05-06 ENCOUNTER — Encounter: Payer: Self-pay | Admitting: Family

## 2022-05-14 ENCOUNTER — Encounter: Payer: Self-pay | Admitting: Family

## 2022-05-14 ENCOUNTER — Ambulatory Visit: Payer: 59 | Admitting: Family

## 2022-05-14 VITALS — BP 140/82 | HR 75 | Temp 98.0°F | Resp 14 | Ht 66.0 in | Wt 208.0 lb

## 2022-05-14 DIAGNOSIS — F339 Major depressive disorder, recurrent, unspecified: Secondary | ICD-10-CM | POA: Diagnosis not present

## 2022-05-14 DIAGNOSIS — D649 Anemia, unspecified: Secondary | ICD-10-CM

## 2022-05-14 DIAGNOSIS — I10 Essential (primary) hypertension: Secondary | ICD-10-CM

## 2022-05-14 MED ORDER — BUPROPION HCL ER (XL) 300 MG PO TB24: ORAL_TABLET | ORAL | 3 refills | Status: DC

## 2022-05-14 MED ORDER — LOSARTAN POTASSIUM 100 MG PO TABS: 100.0000 mg | ORAL_TABLET | Freq: Every day | ORAL | 3 refills | Status: DC

## 2022-05-14 NOTE — Patient Instructions (Addendum)
Start amlodipine 5mg  for blood pressure, and if you can, I would recommend taking at night.  Goal of blood pressure is less than 120/80.   Please continue hydrochlorothiazide and losartan.  Please have anemia studies done at [facility]  Always nice seeing you!

## 2022-05-14 NOTE — Assessment & Plan Note (Signed)
Elevated readings at home and today in the office.  Patient will start amlodipine 5 mg, continue hydrochlorothiazide 12.5 mg daily, losartan 100 mg daily.  Encouraged her to take amlodipine in the evening for better blood pressure control.  She will monitor blood pressure and let me know how she is doing

## 2022-05-14 NOTE — Progress Notes (Signed)
Subjective:    Patient ID: Anna Mcgee, female    DOB: May 22, 1961, 60 y.o.   MRN: [id number]  CC: [facility] RUNKEL is a 61 y.o. female who presents today for follow up.   HPI: Elevation of blood pressure.   She has had readings of 160/80  No cp, sob, HA, vision loss  Compliant with hydrochlorothiazide 12.5 mg daily, losartan 100 mg.    She did not start amlodipine 5mg  as she thought this was for her Raynaud's phenomenon. No pain in her fingers.   History of iron deficient anemia, infusions with Dr , hematology.  She would  like  iron to be checked today  HISTORY:  Past Medical History:  Diagnosis Date   Heart murmur    History of anemia    Hypertension    Postmenopausal bleeding    Vitamin D deficiency    Vitamin D deficiency    Wears dentures    partial lower   Past Surgical History:  Procedure Laterality Date   COLONOSCOPY WITH PROPOFOL N/A 05/03/2020   Procedure: COLONOSCOPY WITH PROPOFOL;  Surgeon: 05/05/2020, MD;  Location: [facility];  Service: Gastroenterology;  Laterality: N/A;   ESOPHAGOGASTRODUODENOSCOPY (EGD) WITH PROPOFOL N/A 05/03/2020   Procedure: ESOPHAGOGASTRODUODENOSCOPY (EGD) WITH PROPOFOL;  Surgeon: 05/05/2020, MD;  Location: [facility];  Service: Gastroenterology;  Laterality: N/A;   GASTRIC BYPASS  2006   Family History  Problem Relation Age of Onset   Hypertension Mother    Hypertension Father    Arthritis Maternal Grandmother    Colon cancer Neg Hx     Allergies: Amlodipine and Penicillins Current Outpatient Medications on File Prior to Visit  Medication Sig Dispense Refill   hydrochlorothiazide (HYDRODIURIL) 25 MG tablet Take 0.5 tablets (12.5 mg total) by mouth daily. 90 tablet 3   amLODipine (NORVASC) 5 MG tablet Take 0.5 tablets (2.5 mg total) by mouth daily. (Patient not taking: Reported on 05/14/2022) 90 tablet 1   No current facility-administered medications on file prior to visit.     Social History   Tobacco Use   Smoking status: Former    Types: Cigarettes    Quit date: 08/01/2010    Years since quitting: 11.7   Smokeless tobacco: Never   Tobacco comments:    Vapes  Vaping Use   Vaping Use: Every day   Substances: Nicotine   Devices: Logic  Substance Use Topics   Alcohol use: Yes    Alcohol/week: 0.0 standard drinks of alcohol    Comment: Rare   Drug use: No    Review of Systems  Constitutional:  Negative for chills and fever.  Respiratory:  Negative for cough.   Cardiovascular:  Negative for chest pain and palpitations.  Gastrointestinal:  Negative for nausea and vomiting.      Objective:    BP (!) 140/82 (BP Location: Left Arm, Patient Position: Sitting, Cuff Size: Large)   Pulse 75   Temp 98 F (36.7 C) (Oral)   Resp 14   Ht 5\' 6"  (1.676 m)   Wt 208 lb (94.3 kg)   SpO2 98%   BMI 33.57 kg/m  BP Readings from Last 3 Encounters:  05/14/22 (!) 140/82  03/11/22 (!) 140/92  02/12/22 136/88   Wt Readings from Last 3 Encounters:  05/14/22 208 lb (94.3 kg)  04/02/22 217 lb (98.4 kg)  03/11/22 217 lb (98.4 kg)    Physical Exam Vitals reviewed.  Constitutional:      Appearance:  She is well-developed.  Eyes:     Conjunctiva/sclera: Conjunctivae normal.  Cardiovascular:     Rate and Rhythm: Normal rate and regular rhythm.     Pulses: Normal pulses.     Heart sounds: Normal heart sounds.  Pulmonary:     Effort: Pulmonary effort is normal.     Breath sounds: Normal breath sounds. No wheezing, rhonchi or rales.  Skin:    General: Skin is warm and dry.  Neurological:     Mental Status: She is alert.  Psychiatric:        Speech: Speech normal.        Behavior: Behavior normal.        Thought Content: Thought content normal.        Assessment & Plan:   Problem List Items Addressed This Visit       Cardiovascular and Mediastinum   Hypertension    Elevated readings at home and today in the office.  Patient will start  amlodipine 5 mg, continue hydrochlorothiazide 12.5 mg daily, losartan 100 mg daily.  Encouraged her to take amlodipine in the evening for better blood pressure control.  She will monitor blood pressure and let me know how she is doing      Relevant Medications   losartan (COZAAR) 100 MG tablet     Other   Anemia - Primary   Relevant Orders   CBC with Differential/Platelet   Comprehensive metabolic panel   Iron, TIBC and Ferritin Panel   Depression, recurrent (HCC)   Relevant Medications   buPROPion (WELLBUTRIN XL) 300 MG 24 hr tablet     I am having Anna A. Mcburney "[name]" maintain her hydrochlorothiazide, amLODipine, losartan, and buPROPion.   Meds ordered this encounter  Medications   losartan (COZAAR) 100 MG tablet    Sig: Take 1 tablet (100 mg total) by mouth daily.    Dispense:  90 tablet    Refill:  3    Order Specific Question:   Supervising Provider    Answer:   Duncan Dull L [[id number]]   buPROPion (WELLBUTRIN XL) 300 MG 24 hr tablet    Sig: TAKE 1 TABLET(300 MG) BY MOUTH EVERY MORNING    Dispense:  90 tablet    Refill:  3    Order Specific Question:   Supervising Provider    Answer:   Sherlene Shams [[id number]]    Return precautions given.   Risks, benefits, and alternatives of the medications and treatment plan prescribed today were discussed, and patient expressed understanding.   Education regarding symptom management and diagnosis given to patient on AVS.  Continue to follow with Allegra Grana, FNP for routine health maintenance.   Anna Mcgee and I agreed with plan.   Rennie Plowman, FNP

## 2022-05-15 ENCOUNTER — Encounter: Payer: Self-pay | Admitting: Family

## 2022-05-16 ENCOUNTER — Other Ambulatory Visit: Payer: Self-pay

## 2022-05-16 DIAGNOSIS — I10 Essential (primary) hypertension: Secondary | ICD-10-CM

## 2022-05-16 MED ORDER — HYDROCHLOROTHIAZIDE 25 MG PO TABS: 12.5000 mg | ORAL_TABLET | Freq: Every day | ORAL | 3 refills | Status: DC

## 2022-05-16 NOTE — Telephone Encounter (Signed)
RX sent in to pharmacy and pt is aware 

## 2022-06-25 ENCOUNTER — Encounter: Payer: Self-pay | Admitting: Family

## 2022-06-25 ENCOUNTER — Other Ambulatory Visit: Payer: Self-pay | Admitting: Family

## 2022-06-25 DIAGNOSIS — R899 Unspecified abnormal finding in specimens from other organs, systems and tissues: Secondary | ICD-10-CM

## 2022-06-26 NOTE — Telephone Encounter (Signed)
You are welcome

## 2022-06-27 LAB — URINALYSIS, ROUTINE W REFLEX MICROSCOPIC
Bilirubin, UA: NEGATIVE
Glucose, UA: NEGATIVE
Ketones, UA: NEGATIVE
Nitrite, UA: NEGATIVE
Protein,UA: NEGATIVE
RBC, UA: NEGATIVE
Specific Gravity, UA: 1.014 (ref 1.005–1.030)
Urobilinogen, Ur: 0.2 mg/dL (ref 0.2–1.0)
pH, UA: 6 (ref 5.0–7.5)

## 2022-06-27 LAB — URINE CULTURE

## 2022-06-27 LAB — BASIC METABOLIC PANEL
BUN/Creatinine Ratio: 14 (ref 12–28)
BUN: 14 mg/dL (ref 8–27)
CO2: 22 mmol/L (ref 20–29)
Calcium: 9.7 mg/dL (ref 8.7–10.3)
Chloride: 96 mmol/L (ref 96–106)
Creatinine, Ser: 1.01 mg/dL — ABNORMAL HIGH (ref 0.57–1.00)
Glucose: 78 mg/dL (ref 70–99)
Potassium: 3.9 mmol/L (ref 3.5–5.2)
Sodium: 134 mmol/L (ref 134–144)
eGFR: 63 mL/min/{1.73_m2} (ref >59)

## 2022-06-27 LAB — MICROSCOPIC EXAMINATION
Bacteria, UA: NONE SEEN
Casts: NONE SEEN /lpf
Epithelial Cells (non renal): NONE SEEN /hpf (ref 0–10)
RBC, Urine: NONE SEEN /hpf (ref 0–2)

## 2022-06-27 LAB — VITAMIN D 25 HYDROXY (VIT D DEFICIENCY, FRACTURES): Vit D, 25-Hydroxy: 28 ng/mL — ABNORMAL LOW (ref 30.0–100.0)

## 2022-06-27 LAB — HEMOGLOBIN A1C
Est. average glucose Bld gHb Est-mCnc: 114 mg/dL
Hgb A1c MFr Bld: 5.6 % (ref 4.8–5.6)

## 2022-07-04 ENCOUNTER — Other Ambulatory Visit: Payer: Self-pay | Admitting: Family

## 2022-07-04 DIAGNOSIS — N3000 Acute cystitis without hematuria: Secondary | ICD-10-CM

## 2022-07-04 MED ORDER — NITROFURANTOIN MONOHYD MACRO 100 MG PO CAPS: 100.0000 mg | ORAL_CAPSULE | Freq: Two times a day (BID) | ORAL | 0 refills | Status: DC

## 2022-07-11 ENCOUNTER — Other Ambulatory Visit: Payer: Self-pay

## 2022-07-11 MED ORDER — AMLODIPINE BESYLATE 10 MG PO TABS: 10.0000 mg | ORAL_TABLET | Freq: Every day | ORAL | 0 refills | Status: DC

## 2022-07-11 MED ORDER — VITAMIN D3 125 MCG (5000 UT) PO CAPS: 5000.0000 [IU] | ORAL_CAPSULE | Freq: Every day | ORAL | Status: AC

## 2022-07-11 NOTE — Progress Notes (Signed)
Rx sent in Med List updated

## 2022-08-26 ENCOUNTER — Encounter: Payer: Self-pay | Admitting: Family

## 2022-08-26 NOTE — Telephone Encounter (Signed)
Spoke to pt and scheduled in office appt

## 2022-08-29 ENCOUNTER — Encounter: Payer: Self-pay | Admitting: Family

## 2022-08-29 ENCOUNTER — Ambulatory Visit: Payer: 59 | Admitting: Family

## 2022-08-29 VITALS — BP 132/78 | HR 87 | Temp 97.9°F | Ht 66.0 in | Wt 205.1 lb

## 2022-08-29 DIAGNOSIS — L0291 Cutaneous abscess, unspecified: Secondary | ICD-10-CM

## 2022-08-29 MED ORDER — DOXYCYCLINE HYCLATE 100 MG PO TABS: 100.0000 mg | ORAL_TABLET | Freq: Two times a day (BID) | ORAL | 0 refills | Status: AC

## 2022-08-29 MED ORDER — MUPIROCIN 2 % EX OINT: 1.0000 | TOPICAL_OINTMENT | Freq: Two times a day (BID) | CUTANEOUS | 2 refills | Status: DC

## 2022-08-29 NOTE — Patient Instructions (Signed)
Warm compresses.  You may start with Bactroban over the boil.  If this does not resolve boil, you may then start doxycycline.Ensure to take probiotics while on antibiotics and also for 2 weeks after completion. This can either be by eating yogurt daily or taking a probiotic supplement over the counter such as Culturelle.It is important to re-colonize the gut with good bacteria and also to prevent any diarrheal infections associated with antibiotic use.

## 2022-08-29 NOTE — Assessment & Plan Note (Signed)
Advised to start with warm compresses, Bactroban.  If no resolution of boil, start doxycycline with probiotics

## 2022-08-29 NOTE — Progress Notes (Signed)
Assessment & Plan:  Cutaneous abscess, unspecified site Assessment & Plan: Advised to start with warm compresses, Bactroban.  If no resolution of boil, start doxycycline with probiotics  Orders: -     Doxycycline Hyclate; Take 1 tablet (100 mg total) by mouth 2 (two) times daily for 7 days.  Dispense: 14 tablet; Refill: 0 -     Mupirocin; Apply 1 Application topically 2 (two) times daily.  Dispense: 22 g; Refill: 2     Return precautions given.   Risks, benefits, and alternatives of the medications and treatment plan prescribed today were discussed, and patient expressed understanding.   Education regarding symptom management and diagnosis given to patient on AVS either electronically or printed.  Return in about 6 months (around 03/01/2023).  Mable Paris, FNP  Subjective:    Patient ID: Anna Mcgee, female    DOB: 21-Apr-1961, 62 y.o.   MRN: [id number]  CC: Anna Mcgee is a 62 y.o. female who presents today for an acute visit.    HPI: Complains of right sided external vaginal 'boil' x 5 days ago, had increased in size to size of 'lima bean' and now decreased in size.  Spontaneously draining,  Started out looking like pimple noticed weeks ago.   No fever, unusual vaginal discharge  She has been using OTC Boil Ease for pain.       Allergies: Amlodipine and Penicillins Current Outpatient Medications on File Prior to Visit  Medication Sig Dispense Refill   amLODipine (NORVASC) 10 MG tablet Take 1 tablet (10 mg total) by mouth daily. 90 tablet 0   buPROPion (WELLBUTRIN XL) 300 MG 24 hr tablet TAKE 1 TABLET(300 MG) BY MOUTH EVERY MORNING 90 tablet 3   Cholecalciferol (VITAMIN D3) 125 MCG (5000 UT) CAPS Take 1 capsule (5,000 Units total) by mouth daily. 30 capsule    hydrochlorothiazide (HYDRODIURIL) 25 MG tablet Take 0.5 tablets (12.5 mg total) by mouth daily. 90 tablet 3   losartan (COZAAR) 100 MG tablet Take 1 tablet (100 mg total) by mouth daily. 90 tablet 3    nitrofurantoin, macrocrystal-monohydrate, (MACROBID) 100 MG capsule Take 1 capsule (100 mg total) by mouth 2 (two) times daily. Take with food. 10 capsule 0   No current facility-administered medications on file prior to visit.    Review of Systems  Constitutional:  Negative for chills and fever.  Respiratory:  Negative for cough.   Cardiovascular:  Negative for chest pain and palpitations.  Gastrointestinal:  Negative for nausea and vomiting.      Objective:    BP 132/78   Pulse 87   Temp 97.9 F (36.6 C) (Oral)   Ht 5\' 6"  (1.676 m)   Wt 205 lb 1.6 oz (93 kg)   SpO2 99%   BMI 33.10 kg/m   BP Readings from Last 3 Encounters:  08/29/22 132/78  05/14/22 (!) 140/82  03/11/22 (!) 140/92   Wt Readings from Last 3 Encounters:  08/29/22 205 lb 1.6 oz (93 kg)  05/14/22 208 lb (94.3 kg)  04/02/22 217 lb (98.4 kg)    Physical Exam Vitals reviewed.  Constitutional:      Appearance: She is well-developed.  Eyes:     Conjunctiva/sclera: Conjunctivae normal.  Cardiovascular:     Rate and Rhythm: Normal rate and regular rhythm.     Pulses: Normal pulses.     Heart sounds: Normal heart sounds.  Pulmonary:     Effort: Pulmonary effort is normal.     Breath sounds:  Normal breath sounds. No wheezing, rhonchi or rales.  Genitourinary:    Labia:        Right: Tenderness present. No rash.        Left: No rash.        Comments: Approximately 4 to 5 cm abscess right groin, proximal to labia.  Nonfluctuant.  No purulent discharge Skin:    General: Skin is warm and dry.  Neurological:     Mental Status: She is alert.  Psychiatric:        Speech: Speech normal.        Behavior: Behavior normal.        Thought Content: Thought content normal.

## 2022-10-05 ENCOUNTER — Other Ambulatory Visit: Payer: Self-pay | Admitting: Family

## 2022-10-20 ENCOUNTER — Encounter: Payer: Self-pay | Admitting: Family

## 2022-10-20 ENCOUNTER — Telehealth: Payer: 59 | Admitting: Family

## 2022-10-20 VITALS — Ht 66.0 in | Wt 205.0 lb

## 2022-10-20 DIAGNOSIS — J00 Acute nasopharyngitis [common cold]: Secondary | ICD-10-CM | POA: Insufficient documentation

## 2022-10-20 DIAGNOSIS — Z20822 Contact with and (suspected) exposure to covid-19: Secondary | ICD-10-CM

## 2022-10-20 DIAGNOSIS — J029 Acute pharyngitis, unspecified: Secondary | ICD-10-CM

## 2022-10-20 NOTE — Patient Instructions (Addendum)
Advise that you test for covid.  Stop zyrtec start xyzal or allegra.  Start flonase.  Pataday over the counter recommended for eye irritation.   Regards,   Mort Sawyers FNP-C

## 2022-10-20 NOTE — Progress Notes (Signed)
Virtual Visit via Video note  I connected with Anna Mcgee on 10/20/22 at home by video and verified that I am speaking with the correct person using two identifiers. The provider, Mort Sawyers, FNP is located in their office at time of visit.  I discussed the limitations, risks, security and privacy concerns of performing an evaluation and management service by video and the availability of in person appointments. I also discussed with the patient that there may be a patient responsible charge related to this service. The patient expressed understanding and agreed to proceed.  Subjective: PCP: Allegra Grana, FNP  Chief Complaint  Patient presents with   Sinus Problem    Headache and pressure since Saturday. No fever, occasional cough, and has some sneezing.    Sinus Problem     Three days ago started with headache and nasal congestion with sinus pressure. She is taking over the counter mucinex and theraflu without much relief.  Did have a sore throat but has since resolved.  No fever or chills.  Does sneeze here and there, taking otc zyrtec without much relief.   Has not tested for covid.    ROS: Per HPI  Current Outpatient Medications:    amLODipine (NORVASC) 10 MG tablet, TAKE 1 TABLET(10 MG) BY MOUTH DAILY, Disp: 90 tablet, Rfl: 0   buPROPion (WELLBUTRIN XL) 300 MG 24 hr tablet, TAKE 1 TABLET(300 MG) BY MOUTH EVERY MORNING, Disp: 90 tablet, Rfl: 3   Cholecalciferol (VITAMIN D3) 125 MCG (5000 UT) CAPS, Take 1 capsule (5,000 Units total) by mouth daily., Disp: 30 capsule, Rfl:    hydrochlorothiazide (HYDRODIURIL) 25 MG tablet, Take 0.5 tablets (12.5 mg total) by mouth daily., Disp: 90 tablet, Rfl: 3   losartan (COZAAR) 100 MG tablet, Take 1 tablet (100 mg total) by mouth daily., Disp: 90 tablet, Rfl: 3  Observations/Objective: Physical Exam Constitutional:      General: She is not in acute distress.    Appearance: Normal appearance. She is not ill-appearing.   Pulmonary:     Effort: Pulmonary effort is normal.  Neurological:     General: No focal deficit present.     Mental Status: She is alert and oriented to person, place, and time.  Psychiatric:        Mood and Affect: Mood normal.        Behavior: Behavior normal.        Thought Content: Thought content normal.     Assessment and Plan: Sore throat  Contact with and (suspected) exposure to covid-19  Acute nasopharyngitis Assessment & Plan: Pt states will test for covid to r/o if positive still within window until 5/8 to start antiviral  Pt will let me know once she gets test from pharmacy does not want to drive by today for testing.  Advised patient on supportive measures:  Be sure to rest, drink plenty of fluids, and use tylenol as needed for fever. Follow up if fever >101, if symptoms worsen or if symptoms are not improved in 3 days. Patient verbalizes understanding.   Did recommend starting daily flonase, stopping zyrtec and starting allegra and or xyzal instead.      Follow Up Instructions: Return if symptoms worsen or fail to improve follow up with PCP.   I discussed the assessment and treatment plan with the patient. The patient was provided an opportunity to ask questions and all were answered. The patient agreed with the plan and demonstrated an understanding of the instructions.  The patient was advised to call back or seek an in-person evaluation if the symptoms worsen or if the condition fails to improve as anticipated.  The above assessment and management plan was discussed with the patient. The patient verbalized understanding of and has agreed to the management plan. Patient is aware to call the clinic if symptoms persist or worsen. Patient is aware when to return to the clinic for a follow-up visit. Patient educated on when it is appropriate to go to the emergency department.      Mort Sawyers, MSN, APRN, FNP-C [facility]

## 2022-10-20 NOTE — Assessment & Plan Note (Signed)
Pt states will test for covid to r/o if positive still within window until 5/8 to start antiviral  Pt will let me know once she gets test from pharmacy does not want to drive by today for testing.  Advised patient on supportive measures:  Be sure to rest, drink plenty of fluids, and use tylenol as needed for fever. Follow up if fever >101, if symptoms worsen or if symptoms are not improved in 3 days. Patient verbalizes understanding.   Did recommend starting daily flonase, stopping zyrtec and starting allegra and or xyzal instead.

## 2022-10-21 ENCOUNTER — Other Ambulatory Visit: Payer: Self-pay | Admitting: Family

## 2022-10-21 DIAGNOSIS — U071 COVID-19: Secondary | ICD-10-CM

## 2022-10-21 MED ORDER — NIRMATRELVIR/RITONAVIR (PAXLOVID)TABLET
3.0000 | ORAL_TABLET | Freq: Two times a day (BID) | ORAL | 0 refills | Status: AC
Start: 2022-10-21 — End: 2022-10-26

## 2022-10-21 NOTE — Telephone Encounter (Signed)
Patient called in to follow up on this request. 

## 2023-01-05 ENCOUNTER — Other Ambulatory Visit: Payer: Self-pay | Admitting: Family

## 2023-01-09 ENCOUNTER — Other Ambulatory Visit: Payer: Self-pay

## 2023-01-09 ENCOUNTER — Other Ambulatory Visit: Payer: Self-pay | Admitting: Family

## 2023-01-09 DIAGNOSIS — I10 Essential (primary) hypertension: Secondary | ICD-10-CM

## 2023-01-09 MED ORDER — HYDROCHLOROTHIAZIDE 25 MG PO TABS: 12.5000 mg | ORAL_TABLET | Freq: Every day | ORAL | 3 refills | Status: DC

## 2023-01-12 ENCOUNTER — Encounter: Payer: Self-pay | Admitting: Family

## 2023-01-13 NOTE — Telephone Encounter (Signed)
[facility] pharmacy and they had refill they were filling it and will let pt know when rx is ready. Pt has bee notified

## 2023-01-22 ENCOUNTER — Ambulatory Visit: Payer: 59 | Admitting: Nurse Practitioner

## 2023-01-22 ENCOUNTER — Encounter: Payer: Self-pay | Admitting: Nurse Practitioner

## 2023-01-22 VITALS — BP 136/82 | HR 86 | Temp 98.3°F | Ht 67.0 in | Wt 205.1 lb

## 2023-01-22 DIAGNOSIS — M5441 Lumbago with sciatica, right side: Secondary | ICD-10-CM | POA: Diagnosis not present

## 2023-01-22 DIAGNOSIS — R5383 Other fatigue: Secondary | ICD-10-CM

## 2023-01-22 MED ORDER — MELOXICAM 7.5 MG PO TABS
7.5000 mg | ORAL_TABLET | Freq: Every day | ORAL | 0 refills | Status: DC
Start: 2023-01-22 — End: 2023-02-20

## 2023-01-22 MED ORDER — PREDNISONE 20 MG PO TABS: 20.0000 mg | ORAL_TABLET | Freq: Every day | ORAL | 0 refills | Status: AC

## 2023-01-22 NOTE — Assessment & Plan Note (Addendum)
Straight right leg raise positive and left negative. Will treat with meloxicam and prednisone. Advised to do alternate hot and cold pack.

## 2023-01-22 NOTE — Patient Instructions (Signed)
Labs ordered for [facility]. Rx sent to pharmacy. Alternate hot and cold pack. If symptoms not improving please call the office.

## 2023-01-22 NOTE — Assessment & Plan Note (Signed)
Will check CBCs and iron panel.

## 2023-01-22 NOTE — Progress Notes (Signed)
Established Patient Office Visit  Subjective:  Patient ID: Anna Mcgee, female    DOB: [birth year]  Age: 62 y.o. MRN: [id number]  CC:  Chief Complaint  Patient presents with   Back Pain    Lower back    HPI  Anna Mcgee presents for chronic back pain. She is in process of  moving and is doing lot of pulling, pushing and lifting. She thinks that aggravated her back pain about 3 days ago. She has low back pain that radiates to both the legs.  Pt states that Flexeril gave her weird dreams.  She also feel tired and fatigue for few months and would like to get her Hgb and Fe level checked. She had Iron infusion in the past.   Lab Results  Component Value Date   IRON 106 08/15/2020   TIBC 392 08/15/2020   FERRITIN 93 08/15/2020    Past Medical History:  Diagnosis Date   Heart murmur    History of anemia    Hypertension    Postmenopausal bleeding    Vitamin D deficiency    Vitamin D deficiency    Wears dentures    partial lower    Past Surgical History:  Procedure Laterality Date   COLONOSCOPY WITH PROPOFOL N/A 05/03/2020   Procedure: COLONOSCOPY WITH PROPOFOL;  Surgeon: Toney Reil, MD;  Location: [facility];  Service: Gastroenterology;  Laterality: N/A;   ESOPHAGOGASTRODUODENOSCOPY (EGD) WITH PROPOFOL N/A 05/03/2020   Procedure: ESOPHAGOGASTRODUODENOSCOPY (EGD) WITH PROPOFOL;  Surgeon: Toney Reil, MD;  Location: [facility];  Service: Gastroenterology;  Laterality: N/A;   GASTRIC BYPASS  2006    Family History  Problem Relation Age of Onset   Hypertension Mother    Hypertension Father    Arthritis Maternal Grandmother    Colon cancer Neg Hx     Social History   Socioeconomic History   Marital status: Single    Spouse name: Not on file   Number of children: Not on file   Years of education: Not on file   Highest education level: Associate degree: occupational, Scientist, product/process development, or vocational program  Occupational History    Not on file  Tobacco Use   Smoking status: Former    Current packs/day: 0.00    Types: Cigarettes    Quit date: 08/01/2010    Years since quitting: 12.4   Smokeless tobacco: Never   Tobacco comments:    Vapes  Vaping Use   Vaping status: Every Day   Substances: Nicotine   Devices: Logic  Substance and Sexual Activity   Alcohol use: Yes    Alcohol/week: 0.0 standard drinks of alcohol    Comment: Rare   Drug use: No   Sexual activity: Not Currently  Other Topics Concern   Not on file  Social History Narrative   Single ; Employed at [employer] ; Children- 0 ; Caffeine- coffee 3 cups, diet soda 1, unsweet tea       Lives in . Quit smoking- ~10 years; no alcohol. vapes- nicotine.    Social Determinants of Health   Financial Resource Strain: Low Risk  (01/21/2023)   Overall Financial Resource Strain (CARDIA)    Difficulty of Paying Living Expenses: Not very hard  Food Insecurity: No Food Insecurity (01/21/2023)   Hunger Vital Sign    Worried About Running Out of Food in the Last Year: Never true    Ran Out of Food in the Last Year: Never true  Transportation Needs:  No Transportation Needs (01/21/2023)   PRAPARE - Administrator, Civil Service (Medical): No    Lack of Transportation (Non-Medical): No  Physical Activity: Insufficiently Active (01/21/2023)   Exercise Vital Sign    Days of Exercise per Week: 2 days    Minutes of Exercise per Session: 10 min  Stress: Stress Concern Present (01/21/2023)   [employer] of Occupational Health - Occupational Stress Questionnaire    Feeling of Stress : To some extent  Social Connections: Moderately Isolated (01/21/2023)   Social Connection and Isolation Panel [NHANES]    Frequency of Communication with Friends and Family: More than three times a week    Frequency of Social Gatherings with Friends and Family: Twice a week    Attends Religious Services: More than 4 times per year    Active Member of [employer] or  Organizations: No    Attends Engineer, structural: Not on file    Marital Status: Never married  Catering manager Violence: Not on file     Outpatient Medications Prior to Visit  Medication Sig Dispense Refill   amLODipine (NORVASC) 10 MG tablet TAKE 1 TABLET(10 MG) BY MOUTH DAILY 90 tablet 0   buPROPion (WELLBUTRIN XL) 300 MG 24 hr tablet TAKE 1 TABLET(300 MG) BY MOUTH EVERY MORNING 90 tablet 3   Cholecalciferol (VITAMIN D3) 125 MCG (5000 UT) CAPS Take 1 capsule (5,000 Units total) by mouth daily. 30 capsule    hydrochlorothiazide (HYDRODIURIL) 25 MG tablet Take 0.5 tablets (12.5 mg total) by mouth daily. 90 tablet 3   losartan (COZAAR) 100 MG tablet Take 1 tablet (100 mg total) by mouth daily. 90 tablet 3   No facility-administered medications prior to visit.    Allergies  Allergen Reactions   Amlodipine     Leg swelling   Penicillins Hives    ROS Review of Systems Negative unless indicated in HPI.    Objective:    Physical Exam Constitutional:      Appearance: Normal appearance.  Cardiovascular:     Rate and Rhythm: Normal rate and regular rhythm.     Pulses: Normal pulses.     Heart sounds: Normal heart sounds.  Pulmonary:     Effort: Pulmonary effort is normal.     Breath sounds: No stridor. No wheezing.  Abdominal:     General: Bowel sounds are normal. There is no distension.     Palpations: Abdomen is soft.     Hernia: No hernia is present.  Musculoskeletal:        General: No swelling or tenderness.     Cervical back: Normal range of motion.     Right lower leg: No edema.     Left lower leg: No edema.     Comments: Straight leg raise right positive Straight leg raise left negative  Skin:    General: Skin is warm.     Findings: No bruising or rash.  Neurological:     General: No focal deficit present.     Mental Status: She is alert and oriented to person, place, and time. Mental status is at baseline.  Psychiatric:        Mood and Affect:  Mood normal.        Behavior: Behavior normal.        Thought Content: Thought content normal.        Judgment: Judgment normal.     BP 136/82   Pulse 86   Temp 98.3 F (36.8 C) (Oral)  Ht 5\' 7"  (1.702 m)   Wt 205 lb 1.6 oz (93 kg)   BMI 32.12 kg/m  Wt Readings from Last 3 Encounters:  01/22/23 205 lb 1.6 oz (93 kg)  10/20/22 205 lb (93 kg)  08/29/22 205 lb 1.6 oz (93 kg)     Health Maintenance  Topic Date Due   MAMMOGRAM  Never done   Zoster Vaccines- Shingrix (2 of 2) 05/06/2022   DTaP/Tdap/Td (2 - Td or Tdap) 07/20/2022   COVID-19 Vaccine (3 - 2023-24 season) 10/20/2023 (Originally 02/14/2022)   PAP SMEAR-Modifier  03/11/2025   Colonoscopy  05/03/2030   Hepatitis C Screening  Completed   HIV Screening  Completed   HPV VACCINES  Aged Out   Fecal DNA (Cologuard)  Discontinued    There are no preventive care reminders to display for this patient.  Lab Results  Component Value Date   TSH 0.532 03/10/2022   Lab Results  Component Value Date   WBC 6.5 03/10/2022   HGB 11.6 03/10/2022   HCT 35.5 03/10/2022   MCV 90 03/10/2022   PLT 265 03/10/2022   Lab Results  Component Value Date   NA 134 06/25/2022   K 3.9 06/25/2022   CO2 22 06/25/2022   GLUCOSE 78 06/25/2022   BUN 14 06/25/2022   CREATININE 1.01 (H) 06/25/2022   BILITOT <0.2 03/10/2022   ALKPHOS 75 03/10/2022   AST 31 03/10/2022   ALT 20 03/10/2022   PROT 6.9 03/10/2022   ALBUMIN 4.4 03/10/2022   CALCIUM 9.7 06/25/2022   ANIONGAP 9 08/08/2019   EGFR 63 06/25/2022   Lab Results  Component Value Date   CHOL 170 03/10/2022   Lab Results  Component Value Date   HDL 116 03/10/2022   Lab Results  Component Value Date   LDLCALC 44 03/10/2022   Lab Results  Component Value Date   TRIG 45 03/10/2022   Lab Results  Component Value Date   CHOLHDL 1.5 03/10/2022   Lab Results  Component Value Date   HGBA1C 5.6 06/25/2022      Assessment & Plan:  Bilateral low back pain with  right-sided sciatica, unspecified chronicity Assessment & Plan: Straight right leg raise positive and left negative. Will treat with meloxicam and prednisone. Advised to do alternate hot and cold pack.   Other fatigue Assessment & Plan: Will check CBCs and iron panel.  Orders: -     CBC with Differential/Platelet; Future -     Iron, TIBC and Ferritin Panel; Future  Other orders -     Meloxicam; Take 1 tablet (7.5 mg total) by mouth daily.  Dispense: 30 tablet; Refill: 0 -     predniSONE; Take 1 tablet (20 mg total) by mouth daily with breakfast for 5 days.  Dispense: 5 tablet; Refill: 0  Education provided for symptoms management on AVS either electronically or printed.   Follow-up: Return if symptoms worsen or fail to improve.   Kara Dies, NP

## 2023-01-23 ENCOUNTER — Encounter: Payer: Self-pay | Admitting: Nurse Practitioner

## 2023-02-20 ENCOUNTER — Telehealth: Payer: Self-pay

## 2023-02-20 ENCOUNTER — Other Ambulatory Visit: Payer: Self-pay | Admitting: Nurse Practitioner

## 2023-02-20 MED ORDER — MELOXICAM 7.5 MG PO TABS: 7.5000 mg | ORAL_TABLET | Freq: Every day | ORAL | 0 refills | Status: DC | PRN

## 2023-02-20 NOTE — Telephone Encounter (Signed)
Noted and corrected.

## 2023-02-20 NOTE — Telephone Encounter (Signed)
Kathlene November is calling from [facility] to obtain clarification on prescription we sent for meloxicam (MOBIC) 7.5 MG tablet.  Kathlene November states, "take daily, but limit use" is how the prescription is written, but at the end it says to take more than two weeks.  Kathlene November states he would like to know if this really means to take no more than two weeks?

## 2023-02-20 NOTE — Telephone Encounter (Signed)
Clarification is needed on meloxicam.

## 2023-04-09 ENCOUNTER — Encounter: Payer: Self-pay | Admitting: Family

## 2023-04-09 ENCOUNTER — Ambulatory Visit: Payer: 59 | Admitting: Family

## 2023-04-09 VITALS — BP 139/80 | HR 82 | Temp 97.6°F | Ht 66.0 in

## 2023-04-09 DIAGNOSIS — Z23 Encounter for immunization: Secondary | ICD-10-CM | POA: Diagnosis not present

## 2023-04-09 DIAGNOSIS — Z0000 Encounter for general adult medical examination without abnormal findings: Secondary | ICD-10-CM | POA: Diagnosis not present

## 2023-04-09 DIAGNOSIS — Z8639 Personal history of other endocrine, nutritional and metabolic disease: Secondary | ICD-10-CM

## 2023-04-09 DIAGNOSIS — E669 Obesity, unspecified: Secondary | ICD-10-CM | POA: Diagnosis not present

## 2023-04-09 DIAGNOSIS — I10 Essential (primary) hypertension: Secondary | ICD-10-CM | POA: Diagnosis not present

## 2023-04-09 DIAGNOSIS — F339 Major depressive disorder, recurrent, unspecified: Secondary | ICD-10-CM

## 2023-04-09 DIAGNOSIS — Z1231 Encounter for screening mammogram for malignant neoplasm of breast: Secondary | ICD-10-CM

## 2023-04-09 MED ORDER — WEGOVY 0.25 MG/0.5ML ~~LOC~~ SOAJ
0.2500 mg | SUBCUTANEOUS | 2 refills | Status: DC
  Filled 2023-04-14: qty 2, 28d supply, fill #0

## 2023-04-09 MED ORDER — AMLODIPINE BESYLATE 10 MG PO TABS: 10.0000 mg | ORAL_TABLET | Freq: Every day | ORAL | 3 refills | Status: DC

## 2023-04-09 MED ORDER — BUPROPION HCL ER (XL) 300 MG PO TB24: ORAL_TABLET | ORAL | 3 refills | Status: DC

## 2023-04-09 MED ORDER — LOSARTAN POTASSIUM 100 MG PO TABS: 100.0000 mg | ORAL_TABLET | Freq: Every day | ORAL | 3 refills | Status: DC

## 2023-04-09 NOTE — Assessment & Plan Note (Signed)
Discussed trial of Wegovy to aid in weight loss.  Counseled on blackbox warning as it relates to medullary thyroid cancer , multiple endocrine neoplasia, side effects and titration.  If Reginal Lutes is not approved, we jointly agreed metformin to be an appropriate option as well

## 2023-04-09 NOTE — Assessment & Plan Note (Signed)
Pap smear is up-to-date.  Deferred pelvic exam the absent complaints.  Deferred clinical breast exam in the office due to patient preference.  Patient will schedule mammogram.  2 of 2 Shingrix given today.  Tdap vaccine will be given during nurse visit at future date.

## 2023-04-09 NOTE — Patient Instructions (Addendum)
Please call  and schedule your 3D mammogram and /or bone density scan as we discussed.   [facility]  ( new location in 2023)  [address], [place], [place] [address]  [place], [place]  [phone]   We have discussed starting non insulin daily injectable medication called Reginal Lutes  which is a glucagon like peptide (GLP 1) agonist and works by delaying gastric emptying and increasing insulin secretion.It is given once per week. Most patients see significant weight loss with this drug class.   You may NOT take either medication if you or your family has history of thyroid, parathyroid, OR adrenal cancer. Please confirm you and your family does NOT have this history as this drug class has black box warning on this medication for that reason.   Please follow  directions on prescription and slowly increase from 0.25mg  [place] once per week ;stay here for 4 weeks. You may then increase to 0.5mg  [place] once per week and stay there for 4 weeks.  We can slowly titrate further at follow up with goal of no more than 1-2 lbs weight loss per week.  Semaglutide [facility])  Dose (mg) Once Weekly Titration:    If a dose is not tolerated, consider delaying further dose increases for  another 4 weeks.  If you are actively losing weight on a dose, do not increase medication.   Weeks 1 through 4  0.25 mg once weekly  Weeks 5 through 8  0.5 mg once weekly  Weeks 9 though 12  1 mg once weekly  Weeks 13 through 16  1.7 mg once weekly   Semaglutide Injection (Weight Management) What is this medication? SEMAGLUTIDE (SEM a GLOO tide) promotes weight loss. It may also be used to maintain weight loss. It works by decreasing appetite. Changes to diet and exercise are often combined with this medication. This medicine may be used for other purposes; ask your health care provider or pharmacist if you have questions. COMMON BRAND NAME(S): [id number] What should I tell my care team before I take  this medication? They need to know if you have any of these conditions: Endocrine tumors (MEN 2) or if someone in your family had these tumors Eye disease, vision problems Gallbladder disease History of depression or mental health disease History of pancreatitis Kidney disease Stomach or intestine problems Suicidal thoughts, plans, or attempt; a previous suicide attempt by you or a family member Thyroid cancer or if someone in your family had thyroid cancer An unusual or allergic reaction to semaglutide, other medications, foods, dyes, or preservatives Pregnant or trying to get pregnant Breast-feeding How should I use this medication? This medication is injected under the skin. You will be taught how to prepare and give it. Take it as directed on the prescription label. It is given once every week (every 7 days). Keep taking it unless your care team tells you to stop. It is important that you put your used needles and pens in a special sharps container. Do not put them in a trash can. If you do not have a sharps container, call your pharmacist or care team to get one. A special MedGuide will be given to you by the pharmacist with each prescription and refill. Be sure to read this information carefully each time. This medication comes with INSTRUCTIONS FOR USE. Ask your pharmacist for directions on how to use this medication. Read the information carefully. Talk to your pharmacist or care team if you have questions. Talk  to your care team about the use of this medication in children. While it may be prescribed for children as young as 12 years for selected conditions, precautions do apply. Overdosage: If you think you have taken too much of this medicine contact a poison control center or emergency room at once. NOTE: This medicine is only for you. Do not share this medicine with others. What if I miss a dose? If you miss a dose and the next scheduled dose is more than 2 days away, take the  missed dose as soon as possible. If you miss a dose and the next scheduled dose is less than 2 days away, do not take the missed dose. Take the next dose at your regular time. Do not take double or extra doses. If you miss your dose for 2 weeks or more, take the next dose at your regular time or call your care team to talk about how to restart this medication. What may interact with this medication? Insulin and other medications for diabetes This list may not describe all possible interactions. Give your health care provider a list of all the medicines, herbs, non-prescription drugs, or dietary supplements you use. Also tell them if you smoke, drink alcohol, or use illegal drugs. Some items may interact with your medicine. What should I watch for while using this medication? Visit your care team for regular checks on your progress. It may be some time before you see the benefit from this medication. Drink plenty of fluids while taking this medication. Check with your care team if you have severe diarrhea, nausea, and vomiting, or if you sweat a lot. The loss of too much body fluid may make it dangerous for you to take this medication. This medication may affect blood sugar levels. Ask your care team if changes in diet or medications are needed if you have diabetes. If you or your family notice any changes in your behavior, such as new or worsening depression, thoughts of harming yourself, anxiety, other unusual or disturbing thoughts, or memory loss, call your care team right away. Women should inform their care team if they wish to become pregnant or think they might be pregnant. Losing weight while pregnant is not advised and may cause harm to the unborn child. Talk to your care team for more information. What side effects may I notice from receiving this medication? Side effects that you should report to your care team as soon as possible: Allergic reactions--skin rash, itching, hives, swelling of the  face, lips, tongue, or throat Change in vision Dehydration--increased thirst, dry mouth, feeling faint or lightheaded, headache, dark yellow or brown urine Gallbladder problems--severe stomach pain, nausea, vomiting, fever Heart palpitations--rapid, pounding, or irregular heartbeat Kidney injury--decrease in the amount of urine, swelling of the ankles, hands, or feet Pancreatitis--severe stomach pain that spreads to your back or gets worse after eating or when touched, fever, nausea, vomiting Thoughts of suicide or self-harm, worsening mood, feelings of depression Thyroid cancer--new mass or lump in the neck, pain or trouble swallowing, trouble breathing, hoarseness Side effects that usually do not require medical attention (report to your care team if they continue or are bothersome): Diarrhea Loss of appetite Nausea Stomach pain Vomiting This list may not describe all possible side effects. Call your doctor for medical advice about side effects. You may report side effects to FDA at 1-800-FDA-1088. Where should I keep my medication? Keep out of the reach of children and pets. Refrigeration (preferred): Store in the  refrigerator. Do not freeze. Keep this medication in the original container until you are ready to take it. Get rid of any unused medication after the expiration date. Room temperature: If needed, prior to cap removal, the pen can be stored at room temperature for up to 28 days. Protect from light. If it is stored at room temperature, get rid of any unused medication after 28 days or after it expires, whichever is first. It is important to get rid of the medication as soon as you no longer need it or it is expired. You can do this in two ways: Take the medication to a medication take-back program. Check with your pharmacy or law enforcement to find a location. If you cannot return the medication, follow the directions in the MedGuide. NOTE: This sheet is a summary. It may not  cover all possible information. If you have questions about this medicine, talk to your doctor, pharmacist, or health care provider.  2023 Elsevier/Gold Standard (2020-08-16 00:00:00)   Please download Myfitness Pal App ( basic version is free).   You may log every thing you eat for even 2-3 days to get a better of idea of total daily calories. To loose weight, we have to create caloric deficit to loose weight. The goal is 1-2 lbs per week of weight loss.   Excellent article below from [facility].   [email or web address]://[email or web address].it  Calorie counting made easy  Eat less, exercise more. If only it were that simple! As most dieters know, losing weight can be very challenging. As this report details, a range of influences can affect how people gain and lose weight. But a basic understanding of how to tip your energy balance in favor of weight loss is a good place to start.  Start by determining how many calories you should consume each day. To do so, you need to know how many calories you need to maintain your current weight. Doing this requires a few simple calculations.  First, multiply your current weight by 15 -- that's roughly the number of calories per pound of body weight needed to maintain your current weight if you are moderately active. Moderately active means getting at least 30 minutes of physical activity a day in the form of exercise (walking at a brisk pace, climbing stairs, or active gardening). Let's say you're a woman who is 5 feet, 4 inches tall and weighs 155 pounds, and you need to lose about 15 pounds to put you in a healthy weight range. If you multiply 155 by 15, you will get 2,325, which is the number of calories per day that you need in order to maintain your current weight (weight-maintenance calories). To lose weight, you will need to get below that total.  For example, to lose 1 to 2 pounds a week -- a rate that experts  consider safe -- your food consumption should provide 500 to 1,000 calories less than your total weight-maintenance calories. If you need 2,325 calories a day to maintain your current weight, reduce your daily calories to between 1,325 and 1,825. If you are sedentary, you will also need to build more activity into your day. In order to lose at least a pound a week, try to do at least 30 minutes of physical activity on most days, and reduce your daily calorie intake by at least 500 calories. However, calorie intake should not fall below 1,200 a day in women or 1,500 a day in men, except under the supervision of a health  professional. Eating too few calories can endanger your health by depriving you of needed nutrients.  Meeting your calorie target How can you meet your daily calorie target? One approach is to add up the number of calories per serving of all the foods that you eat, and then plan your menus accordingly. You can buy books that list calories per serving for many foods. In addition, the nutrition labels on all packaged foods and beverages provide calories per serving information. Make a point of reading the labels of the foods and drinks you use, noting the number of calories and the serving sizes. Many recipes published in cookbooks, newspapers, and magazines provide similar information.  If you hate counting calories, a different approach is to restrict how much and how often you eat, and to eat meals that are low in calories. Dietary guidelines issued by the American Heart Association stress common sense in choosing your foods rather than focusing strictly on numbers, such as total calories or calories from fat. Whichever method you choose, research shows that a regular eating schedule -- with meals and snacks planned for certain times each day -- makes for the most successful approach. The same applies after you have lost weight and want to keep it off. Sticking with an eating schedule increases  your chance of maintaining your new weight.    This is  Dr. Melina Schools  ( an amazing physician in my office!)  example of a  "Low GI"  Diet:  It will allow you to lose 4 to 8  lbs  per month if you follow it carefully.  Your goal with exercise is a minimum of 30 minutes of aerobic exercise 5 days per week (Walking does not count once it becomes easy!)    All of the foods can be found at grocery stores and in bulk at [employer].  The Atkins protein bars and shakes are available in more varieties at Target, [employer] and [employer]'s Foods.     7 AM Breakfast:  Choose from the following:  Low carbohydrate Protein  Shakes (I recommend the  Premier Protein chocolate shakes,  EAS AdvantEdge "Carb Control" shakes  Or the Atkins shakes all are under 3 net carbs)     a scrambled egg/bacon/cheese burrito made with Mission's "carb balance" whole wheat tortilla  (about 10 net carbs )  Medical laboratory scientific officer (basically a quiche without the pastry crust) that is eaten cold and very convenient way to get your eggs.  8 carbs)  If you make your own protein shakes, avoid bananas and pineapple,  And use low carb greek yogurt or original /unsweetened almond or soy milk    Avoid cereal and bananas, oatmeal and cream of wheat and grits. They are loaded with carbohydrates!   10 AM: high protein snack:  Protein bar by Atkins (the snack size, under 200 cal, usually < 6 net carbs).    A stick of cheese:  Around 1 carb,  100 cal     Dannon Light n Fit [place] Yogurt  (80 cal, 8 carbs)  Other so called "protein bars" and Greek yogurts tend to be loaded with carbohydrates.  Remember, in food advertising, the word "energy" is synonymous for " carbohydrate."  Lunch:   A Sandwich using the bread choices listed, Can use any  Eggs,  lunchmeat, grilled meat or canned tuna), avocado, regular mayo/mustard  and cheese.  A Salad using blue cheese, ranch,  Goddess or vinagrette,  Avoid taco shells,  croutons or "confetti" and  no "candied nuts" but regular nuts OK.   No pretzels, nabs  or chips.  Pickles and miniature sweet peppers are a good low carb alternative that provide a "crunch"  The bread is the only source of carbohydrate in a sandwich and  can be decreased by trying some of the attached alternatives to traditional loaf bread   Avoid "Low fat dressings, as well as Reyne Dumas and [employer] dressings They are loaded with sugar!   3 PM/ Mid day  Snack:  Consider  1 ounce of  almonds, walnuts, pistachios, pecans, peanuts,  Macadamia nuts or a nut medley.  Avoid "granola and granola bars "  Mixed nuts are ok in moderation as long as there are no raisins,  cranberries or dried fruit.   KIND bars are OK if you get the low glycemic index variety   Try the prosciutto/mozzarella cheese sticks by [employer]  In deli /backery section   High protein      6 PM  Dinner:     Meat/fowl/fish with a green salad, and either broccoli, cauliflower, green beans, spinach, brussel sprouts or  Lima beans. DO NOT BREAD THE PROTEIN!!      There is a low carb pasta by Dreamfield's that is acceptable and tastes great: only 5 digestible carbs/serving.( All grocery stores but BJs carry it ) Several ready made meals are available low carb:   Try Michel Angelo's chicken piccata or chicken or eggplant parm over low carb pasta.(Lowes and BJs)   Clifton Custard Sanchez's "Carnitas" (pulled pork, no sauce,  0 carbs) or his beef pot roast to make a dinner burrito (at [employer]J's)  Pesto over low carb pasta (bj's sells a good quality pesto in the center refrigerated section of the deli   Try satueeing  Roosvelt Harps with mushroooms as a good side   [employer] Giant makes a mashed cauliflower that tastes like mashed potatoes  Whole wheat pasta is still full of digestible carbs and  Not as low in glycemic index as Dreamfield's.   Brown rice is still rice,  So skip the rice and noodles if you eat [place] or [place] (or at least limit to 1/2 cup)  9 PM snack :   Breyer's  "low carb" fudgsicle or  ice cream bar (Carb Smart line), or  Weight Watcher's ice cream bar , or another "no sugar added" ice cream;  a serving of fresh berries/cherries with whipped cream   Cheese or DANNON'S LlGHT N FIT GREEK YOGURT  8 ounces of [place] unsweetened almond/cococunut milk    Treat yourself to a parfait made with whipped cream blueberiies, walnuts and vanilla greek yogurt  Avoid bananas, pineapple, grapes  and watermelon on a regular basis because they are high in sugar.  THINK OF THEM AS DESSERT  Remember that snack Substitutions should be less than 10 NET carbs per serving and meals < 20 carbs. Remember to subtract fiber grams to get the "net carbs."   Health Maintenance for Postmenopausal Women Menopause is a normal process in which your ability to get pregnant comes to an end. This process happens slowly over many months or years, usually between the ages of 4 and 63. Menopause is complete when you have missed your menstrual period for 12 months. It is important to talk with your health care provider about some of the most common conditions that affect women after menopause (postmenopausal women). These include heart disease, cancer, and bone loss (osteoporosis). Adopting a  healthy lifestyle and getting preventive care can help to promote your health and wellness. The actions you take can also lower your chances of developing some of these common conditions. What are the signs and symptoms of menopause? During menopause, you may have the following symptoms: Hot flashes. These can be moderate or severe. Night sweats. Decrease in sex drive. Mood swings. Headaches. Tiredness (fatigue). Irritability. Memory problems. Problems falling asleep or staying asleep. Talk with your health care provider about treatment options for your symptoms. Do I need hormone replacement therapy? Hormone replacement therapy is effective in treating symptoms that are caused by menopause,  such as hot flashes and night sweats. Hormone replacement carries certain risks, especially as you become older. If you are thinking about using estrogen or estrogen with progestin, discuss the benefits and risks with your health care provider. How can I reduce my risk for heart disease and stroke? The risk of heart disease, heart attack, and stroke increases as you age. One of the causes may be a change in the body's hormones during menopause. This can affect how your body uses dietary fats, triglycerides, and cholesterol. Heart attack and stroke are medical emergencies. There are many things that you can do to help prevent heart disease and stroke. Watch your blood pressure High blood pressure causes heart disease and increases the risk of stroke. This is more likely to develop in people who have high blood pressure readings or are overweight. Have your blood pressure checked: Every 3-5 years if you are 72-80 years of age. Every year if you are 45 years old or older. Eat a healthy diet  Eat a diet that includes plenty of vegetables, fruits, low-fat dairy products, and lean protein. Do not eat a lot of foods that are high in solid fats, added sugars, or sodium. Get regular exercise Get regular exercise. This is one of the most important things you can do for your health. Most adults should: Try to exercise for at least 150 minutes each week. The exercise should increase your heart rate and make you sweat (moderate-intensity exercise). Try to do strengthening exercises at least twice each week. Do these in addition to the moderate-intensity exercise. Spend less time sitting. Even light physical activity can be beneficial. Other tips Work with your health care provider to achieve or maintain a healthy weight. Do not use any products that contain nicotine or tobacco. These products include cigarettes, chewing tobacco, and vaping devices, such as e-cigarettes. If you need help quitting, ask your  health care provider. Know your numbers. Ask your health care provider to check your cholesterol and your blood sugar (glucose). Continue to have your blood tested as directed by your health care provider. Do I need screening for cancer? Depending on your health history and family history, you may need to have cancer screenings at different stages of your life. This may include screening for: Breast cancer. Cervical cancer. Lung cancer. Colorectal cancer. What is my risk for osteoporosis? After menopause, you may be at increased risk for osteoporosis. Osteoporosis is a condition in which bone destruction happens more quickly than new bone creation. To help prevent osteoporosis or the bone fractures that can happen because of osteoporosis, you may take the following actions: If you are 71-70 years old, get at least 1,000 mg of calcium and at least 600 international units (IU) of vitamin D per day. If you are older than age 34 but younger than age 79, get at least 1,200 mg of calcium and  at least 600 international units (IU) of vitamin D per day. If you are older than age 81, get at least 1,200 mg of calcium and at least 800 international units (IU) of vitamin D per day. Smoking and drinking excessive alcohol increase the risk of osteoporosis. Eat foods that are rich in calcium and vitamin D, and do weight-bearing exercises several times each week as directed by your health care provider. How does menopause affect my mental health? Depression may occur at any age, but it is more common as you become older. Common symptoms of depression include: Feeling depressed. Changes in sleep patterns. Changes in appetite or eating patterns. Feeling an overall lack of motivation or enjoyment of activities that you previously enjoyed. Frequent crying spells. Talk with your health care provider if you think that you are experiencing any of these symptoms. General instructions See your health care provider for  regular wellness exams and vaccines. This may include: Scheduling regular health, dental, and eye exams. Getting and maintaining your vaccines. These include: Influenza vaccine. Get this vaccine each year before the flu season begins. Pneumonia vaccine. Shingles vaccine. Tetanus, diphtheria, and pertussis (Tdap) booster vaccine. Your health care provider may also recommend other immunizations. Tell your health care provider if you have ever been abused or do not feel safe at home. Summary Menopause is a normal process in which your ability to get pregnant comes to an end. This condition causes hot flashes, night sweats, decreased interest in sex, mood swings, headaches, or lack of sleep. Treatment for this condition may include hormone replacement therapy. Take actions to keep yourself healthy, including exercising regularly, eating a healthy diet, watching your weight, and checking your blood pressure and blood sugar levels. Get screened for cancer and depression. Make sure that you are up to date with all your vaccines. This information is not intended to replace advice given to you by your health care provider. Make sure you discuss any questions you have with your health care provider. Document Revised: 10/22/2020 Document Reviewed: 10/22/2020 Elsevier Patient Education  2024 [employer].

## 2023-04-09 NOTE — Progress Notes (Signed)
Assessment & Plan:  Annual physical exam Assessment & Plan: Pap smear is up-to-date.  Deferred pelvic exam the absent complaints.  Deferred clinical breast exam in the office due to patient preference.  Patient will schedule mammogram.  2 of 2 Shingrix given today.  Tdap vaccine will be given during nurse visit at future date.    Need for shingles vaccine -     Varicella-zoster vaccine IM  Depression, recurrent (HCC) -     buPROPion HCl ER (XL); TAKE 1 TABLET(300 MG) BY MOUTH EVERY MORNING  Dispense: 90 tablet; Refill: 3  Hypertension, unspecified type Assessment & Plan: Chronic, stable.  Continue amlodipine 10 mg daily, hydrochlorothiazide 25 mg daily, losartan 100 mg daily  Orders: -     Comprehensive metabolic panel -     Losartan Potassium; Take 1 tablet (100 mg total) by mouth daily.  Dispense: 90 tablet; Refill: 3  Obesity without serious comorbidity, unspecified class, unspecified obesity type Assessment & Plan: Discussed trial of Wegovy to aid in weight loss.  Counseled on blackbox warning as it relates to medullary thyroid cancer , multiple endocrine neoplasia, side effects and titration.  If Reginal Lutes is not approved, we jointly agreed metformin to be an appropriate option as well  Orders: -     TSH  History of vitamin D deficiency -     VITAMIN D 25 Hydroxy (Vit-D Deficiency, Fractures)  Encounter for screening mammogram for malignant neoplasm of breast -     3D Screening Mammogram, Left and Right; Future  Other orders -     amLODIPine Besylate; Take 1 tablet (10 mg total) by mouth daily.  Dispense: 90 tablet; Refill: 3 -     Wegovy; Inject 0.25 mg into the skin once a week.  Dispense: 2 mL; Refill: 2     Return precautions given.   Risks, benefits, and alternatives of the medications and treatment plan prescribed today were discussed, and patient expressed understanding.   Education regarding symptom management and diagnosis given to patient on AVS either  electronically or printed.  No follow-ups on file.  Rennie Plowman, FNP  Subjective:    Patient ID: Anna Mcgee, female    DOB: 1960-08-05, 62 y.o.   MRN: [id number]  CC: Anna Mcgee is a 62 y.o. female who presents today for physical exam.    HPI: Feels well today. No new complaints.  Back pain has improved.   She is interested in weight loss medication.   She endorses dietary cravings at night.   No personal or family h/o thyroid disease or multiple endocrine neoplasia   Due shingrex 2 of 2.   Colorectal Cancer Screening: UTD Dr. Allegra Lai, repeat in 10 years Breast Cancer Screening: Mammogram  due Cervical Cancer Screening: UTD, obtained 03/11/2022, negative HPV, negative malignancy Bone Health screening/DEXA for 65+: No increased fracture risk. Defer screening at this time.  Lung Cancer Screening: Doesn't have 20 year pack year history and age > 80 years yo 46 years       Tetanus - due         Exercise: Gets regular exercise with walking. Alcohol use:  rare Smoking/tobacco use: former smoker.  she vapes  Health Maintenance  Topic Date Due   Mammogram  Never done   Zoster (Shingles) Vaccine (2 of 2) 05/06/2022   DTaP/Tdap/Td vaccine (2 - Td or Tdap) 07/20/2022   Pap with HPV screening  03/12/2027   Colon Cancer Screening  05/03/2030   Hepatitis C Screening  Completed   HIV Screening  Completed   HPV Vaccine  Aged Out   COVID-19 Vaccine  Discontinued   Cologuard (Stool DNA test)  Discontinued    ALLERGIES: Amlodipine and Penicillins  Current Outpatient Medications on File Prior to Visit  Medication Sig Dispense Refill   Cholecalciferol (VITAMIN D3) 125 MCG (5000 UT) CAPS Take 1 capsule (5,000 Units total) by mouth daily. 30 capsule    hydrochlorothiazide (HYDRODIURIL) 25 MG tablet Take 0.5 tablets (12.5 mg total) by mouth daily. 90 tablet 3   meloxicam (MOBIC) 7.5 MG tablet Take 1 tablet (7.5 mg total) by mouth daily as needed for pain. Don't take for more  than two weeks 30 tablet 0   No current facility-administered medications on file prior to visit.    Review of Systems  Constitutional:  Negative for chills, fever and unexpected weight change.  HENT:  Negative for congestion.   Respiratory:  Negative for cough.   Cardiovascular:  Negative for chest pain, palpitations and leg swelling.  Gastrointestinal:  Negative for nausea and vomiting.  Musculoskeletal:  Negative for arthralgias, back pain and myalgias.  Skin:  Negative for rash.  Neurological:  Negative for headaches.  Hematological:  Negative for adenopathy.  Psychiatric/Behavioral:  Negative for confusion.       Objective:    BP 139/80   Pulse 82   Temp 97.6 F (36.4 C) (Temporal)   Ht 5\' 6"  (1.676 m)   SpO2 97%   BMI 33.10 kg/m   BP Readings from Last 3 Encounters:  04/09/23 139/80  01/22/23 136/82  08/29/22 132/78   Wt Readings from Last 3 Encounters:  01/22/23 205 lb 1.6 oz (93 kg)  10/20/22 205 lb (93 kg)  08/29/22 205 lb 1.6 oz (93 kg)    Physical Exam Vitals reviewed.  Constitutional:      Appearance: She is well-developed.  Eyes:     Conjunctiva/sclera: Conjunctivae normal.  Neck:     Thyroid: No thyroid mass, thyromegaly or thyroid tenderness.  Cardiovascular:     Rate and Rhythm: Normal rate and regular rhythm.     Pulses: Normal pulses.     Heart sounds: Normal heart sounds.  Pulmonary:     Effort: Pulmonary effort is normal.     Breath sounds: Normal breath sounds. No wheezing, rhonchi or rales.  Lymphadenopathy:     Head:     Right side of head: No submental, submandibular, tonsillar, preauricular, posterior auricular or occipital adenopathy.     Left side of head: No submental, submandibular, tonsillar, preauricular, posterior auricular or occipital adenopathy.     Cervical: No cervical adenopathy.  Skin:    General: Skin is warm and dry.  Neurological:     Mental Status: She is alert.  Psychiatric:        Speech: Speech normal.         Behavior: Behavior normal.        Thought Content: Thought content normal.

## 2023-04-09 NOTE — Assessment & Plan Note (Signed)
Chronic, stable.  Continue amlodipine 10 mg daily, hydrochlorothiazide 25 mg daily, losartan 100 mg daily

## 2023-04-14 ENCOUNTER — Other Ambulatory Visit: Payer: Self-pay

## 2023-04-14 ENCOUNTER — Encounter: Payer: Self-pay | Admitting: Internal Medicine

## 2023-04-14 ENCOUNTER — Telehealth: Payer: Self-pay

## 2023-04-14 ENCOUNTER — Other Ambulatory Visit (HOSPITAL_COMMUNITY): Payer: Self-pay

## 2023-04-14 NOTE — Telephone Encounter (Signed)
Pharmacy Patient Advocate Encounter  Received notification from [facility] that Prior Authorization for [facility] 0.25MG /0.5ML auto-injectors  has been APPROVED from 04/14/2023 to 10/13/2023   No insurance card on file to submit test claim

## 2023-04-14 NOTE — Telephone Encounter (Signed)
*  Primary  Pharmacy Patient Advocate Encounter   Received notification from CoverMyMeds that prior authorization for Wegovy 0.25MG /0.5ML auto-injectors  is required/requested.   Insurance verification completed.   The patient is insured through [facility] [employer] [facility] .   Per test claim: PA required; PA submitted to [facility] via CoverMyMeds Key/confirmation #/EOC [id number] Status is pending

## 2023-04-15 ENCOUNTER — Other Ambulatory Visit: Payer: Self-pay

## 2023-04-15 ENCOUNTER — Encounter: Payer: Self-pay | Admitting: Internal Medicine

## 2023-04-27 ENCOUNTER — Ambulatory Visit (INDEPENDENT_AMBULATORY_CARE_PROVIDER_SITE_OTHER): Payer: 59

## 2023-04-27 DIAGNOSIS — Z23 Encounter for immunization: Secondary | ICD-10-CM | POA: Diagnosis not present

## 2023-04-27 NOTE — Progress Notes (Signed)
Pt presented for the TDAP vaccine. Pt was identified through two identifiers. Pt tolerated injection well in the left deltoid.

## 2023-05-04 ENCOUNTER — Encounter: Payer: Self-pay | Admitting: Family

## 2023-05-05 ENCOUNTER — Other Ambulatory Visit: Payer: Self-pay | Admitting: Family

## 2023-05-05 ENCOUNTER — Other Ambulatory Visit: Payer: Self-pay

## 2023-05-05 DIAGNOSIS — Z9884 Bariatric surgery status: Secondary | ICD-10-CM

## 2023-05-05 DIAGNOSIS — E669 Obesity, unspecified: Secondary | ICD-10-CM

## 2023-05-05 MED ORDER — WEGOVY 0.5 MG/0.5ML ~~LOC~~ SOAJ
0.5000 mg | SUBCUTANEOUS | 3 refills | Status: DC
  Filled 2023-05-05 – 2023-05-11 (×2): qty 2, 28d supply, fill #0
  Filled 2023-06-03 (×2): qty 2, 28d supply, fill #1
  Filled 2023-07-03: qty 2, 28d supply, fill #2

## 2023-05-06 ENCOUNTER — Other Ambulatory Visit: Payer: Self-pay

## 2023-05-07 ENCOUNTER — Other Ambulatory Visit: Payer: Self-pay

## 2023-05-11 ENCOUNTER — Other Ambulatory Visit: Payer: Self-pay

## 2023-06-03 ENCOUNTER — Other Ambulatory Visit: Payer: Self-pay

## 2023-07-02 ENCOUNTER — Other Ambulatory Visit: Payer: Self-pay | Admitting: Family

## 2023-07-02 DIAGNOSIS — I10 Essential (primary) hypertension: Secondary | ICD-10-CM

## 2023-07-03 ENCOUNTER — Other Ambulatory Visit: Payer: Self-pay | Admitting: Family

## 2023-07-03 ENCOUNTER — Encounter: Payer: Self-pay | Admitting: Family

## 2023-07-03 ENCOUNTER — Other Ambulatory Visit: Payer: Self-pay

## 2023-07-03 DIAGNOSIS — E669 Obesity, unspecified: Secondary | ICD-10-CM

## 2023-07-03 MED ORDER — WEGOVY 1 MG/0.5ML ~~LOC~~ SOAJ
1.0000 mg | SUBCUTANEOUS | 2 refills | Status: DC
  Filled 2023-07-03: qty 2, 28d supply, fill #0
  Filled 2023-07-24: qty 2, 28d supply, fill #1

## 2023-07-03 MED ORDER — WEGOVY 1 MG/0.5ML ~~LOC~~ SOAJ: 1.0000 mg | SUBCUTANEOUS | 2 refills | Status: DC

## 2023-07-03 NOTE — Telephone Encounter (Signed)
Copied from CRM [id number]. Topic: Clinical - Medication Refill >> Jul 03, 2023 12:26 PM Eunice Blase wrote: Most Recent Primary Care Visit:  Provider: Donavan Foil  Department: [facility]  Visit Type: NURSE VISIT  Date: 04/27/2023  Medication: Semaglutide-Weight Management (WEGOVY) 1 MG/0.5ML SOAJ   Has the patient contacted their pharmacy? Yes (Agent: If no, request that the patient contact the pharmacy for the refill. If patient does not wish to contact the pharmacy document the reason why and proceed with request.) (Agent: If yes, when and what did the pharmacy advise?)  Is this the correct pharmacy for this prescription?  Yes If no, delete pharmacy and type the correct one.  This is the patient's preferred pharmacy:  [facility] - [place], [place] - [address] [place] [address] Phone: [phone] Fax: [phone]     Has the prescription been filled recently? Yes  Is the patient out of the medication? Yes  Has the patient been seen for an appointment in the last year OR does the patient have an upcoming appointment? Yes  Can we respond through MyChart? Yes  Agent: Please be advised that Rx refills may take up to 3 business days. We ask that you follow-up with your pharmacy.

## 2023-07-03 NOTE — Telephone Encounter (Signed)
Copied from CRM [id number]. Topic: Clinical - Medication Refill >> Jul 03, 2023 12:22 PM Eunice Blase wrote: Most Recent Primary Care Visit:  Provider: Donavan Foil  Department: [facility]  Visit Type: NURSE VISIT  Date: 04/27/2023  Medication: ***  Has the patient contacted their pharmacy?  (Agent: If no, request that the patient contact the pharmacy for the refill. If patient does not wish to contact the pharmacy document the reason why and proceed with request.) (Agent: If yes, when and what did the pharmacy advise?)  Is this the correct pharmacy for this prescription?  If no, delete pharmacy and type the correct one.  This is the patient's preferred pharmacy:  [facility] - [place], [place] - [address] [facility] [address] [facility] [address] [facility] [address] [place] [address] Phone: [phone] Fax: [phone]  [facility] - [facility] [address] [place] [address] Phone: [phone] Fax: [phone]   Has the prescription been filled recently?   Is the patient out of the medication?   Has the patient been seen for an appointment in the last year OR does the patient have an upcoming appointment?   Can we respond through MyChart?   Agent: Please be advised that Rx refills may take up to 3 business days. We ask that you follow-up with your pharmacy.

## 2023-07-03 NOTE — Telephone Encounter (Signed)
Copied from CRM [id number]. Topic: Clinical - Medication Refill >> Jul 03, 2023 12:24 PM Eunice Blase wrote: Most Recent Primary Care Visit:  Provider: Donavan Foil  Department: [facility]  Visit Type: NURSE VISIT  Date: 04/27/2023  Medication: ***  Has the patient contacted their pharmacy?  (Agent: If no, request that the patient contact the pharmacy for the refill. If patient does not wish to contact the pharmacy document the reason why and proceed with request.) (Agent: If yes, when and what did the pharmacy advise?)  Is this the correct pharmacy for this prescription?  If no, delete pharmacy and type the correct one.  This is the patient's preferred pharmacy:  [facility] - [place], [place] - [address] [facility] [address] [facility] [address] [place] [address] Phone: [phone] Fax: [phone]  [facility] - [facility] [address] [place] [address] Phone: [phone] Fax: [phone]   Has the prescription been filled recently?   Is the patient out of the medication?   Has the patient been seen for an appointment in the last year OR does the patient have an upcoming appointment?   Can we respond through MyChart?   Agent: Please be advised that Rx refills may take up to 3 business days. We ask that you follow-up with your pharmacy.

## 2023-07-24 ENCOUNTER — Other Ambulatory Visit: Payer: Self-pay

## 2023-07-24 ENCOUNTER — Other Ambulatory Visit: Payer: Self-pay | Admitting: Family

## 2023-07-24 DIAGNOSIS — E669 Obesity, unspecified: Secondary | ICD-10-CM

## 2023-07-24 MED ORDER — WEGOVY 1 MG/0.5ML ~~LOC~~ SOAJ: 1.0000 mg | SUBCUTANEOUS | 2 refills | Status: DC

## 2023-07-24 NOTE — Telephone Encounter (Signed)
 Copied from CRM [id number]. Topic: Clinical - Medication Refill >> Jul 24, 2023 11:51 AM Drema MATSU wrote: Most Recent Primary Care Visit:  Provider: ORLANDO KINGDOM  Department: [facility]  Visit Type: NURSE VISIT  Date: 04/27/2023  Medication: Semaglutide -Weight Management (WEGOVY ) 1.7 MG/0.5ML SOAJ  Has the patient contacted their pharmacy? Yes (Agent: If no, request that the patient contact the pharmacy for the refill. If patient does not wish to contact the pharmacy document the reason why and proceed with request.) (Agent: If yes, when and what did the pharmacy advise?)  Is this the correct pharmacy for this prescription? Yes If no, delete pharmacy and type the correct one.  This is the patient's preferred pharmacy:   [facility] - [facility] [address] [place] [address] Phone: [phone] Fax: [phone]   Has the prescription been filled recently? Yes every month   Is the patient out of the medication? No 1 more that she will take tomorrow  Has the patient been seen for an appointment in the last year OR does the patient have an upcoming appointment? Yes  Can we respond through MyChart? Yes  Agent: Please be advised that Rx refills may take up to 3 business days. We ask that you follow-up with your pharmacy.

## 2023-07-24 NOTE — Telephone Encounter (Signed)
 Last Fill: 07/03/23  Last OV: 04/09/23 Next OV: 10/08/23  Routing to provider for review/authorization.

## 2023-07-28 NOTE — Telephone Encounter (Unsigned)
Copied from CRM [id number]. Topic: Clinical - Prescription Issue >> Jul 28, 2023 12:44 PM Florestine Avers wrote: Reason for CRM: Patient called in to inquire about her Semaglutide-Weight Management (WEGOVY) 1.7 MG/0.5ML . There is a pending CRM, patient is requesting a phone call with an update on her medication.

## 2023-07-29 ENCOUNTER — Other Ambulatory Visit: Payer: Self-pay | Admitting: Family

## 2023-07-29 DIAGNOSIS — E669 Obesity, unspecified: Secondary | ICD-10-CM

## 2023-07-29 MED ORDER — WEGOVY 1.7 MG/0.75ML ~~LOC~~ SOAJ: 1.7000 mg | SUBCUTANEOUS | 2 refills | Status: DC

## 2023-07-30 ENCOUNTER — Other Ambulatory Visit: Payer: Self-pay

## 2023-07-30 ENCOUNTER — Other Ambulatory Visit (HOSPITAL_BASED_OUTPATIENT_CLINIC_OR_DEPARTMENT_OTHER): Payer: Self-pay

## 2023-07-30 ENCOUNTER — Telehealth: Payer: Self-pay

## 2023-07-30 DIAGNOSIS — E669 Obesity, unspecified: Secondary | ICD-10-CM

## 2023-07-30 MED ORDER — WEGOVY 1.7 MG/0.75ML ~~LOC~~ SOAJ
1.7000 mg | SUBCUTANEOUS | 2 refills | Status: DC
  Filled 2023-07-30: qty 3, 28d supply, fill #0

## 2023-07-30 NOTE — Telephone Encounter (Signed)
Copied from CRM [id number]. Topic: Clinical - Prescription Issue >> Jul 30, 2023 12:16 PM Deaijah H wrote: Reason for CRM: Patient called in stating prescription Wegovy 1 mg was sent to incorrect pharmacy need it to go to  [facility] - [facility] [address] [place] [address] Phone: [phone] Fax: [phone] / if needed, please call [phone]

## 2023-07-30 NOTE — Telephone Encounter (Signed)
Medication sent to correct pharmacy

## 2023-07-30 NOTE — Addendum Note (Signed)
Addended by: Kristie Cowman on: 07/30/2023 01:30 PM   Modules accepted: Orders

## 2023-07-31 ENCOUNTER — Other Ambulatory Visit: Payer: Self-pay

## 2023-08-03 ENCOUNTER — Telehealth: Payer: Self-pay

## 2023-08-03 ENCOUNTER — Other Ambulatory Visit (HOSPITAL_COMMUNITY): Payer: Self-pay

## 2023-08-03 NOTE — Telephone Encounter (Signed)
Pharmacy Patient Advocate Encounter   Received notification from Patient Pharmacy that prior authorization for [facility] 1.7mg /0.1ml is required/requested.   Insurance verification completed.   The patient is insured through [facility] .   Per test claim: refill too soon, refill payable on or after 08/21/23

## 2023-08-03 NOTE — Telephone Encounter (Signed)
Spoke to pt she has already picked up rx  for [facility] on 07/31/23

## 2023-08-03 NOTE — Telephone Encounter (Signed)
Noted will reach out to pt and notify

## 2023-08-24 ENCOUNTER — Encounter: Payer: Self-pay | Admitting: Family

## 2023-08-24 ENCOUNTER — Other Ambulatory Visit: Payer: Self-pay

## 2023-08-24 ENCOUNTER — Other Ambulatory Visit: Payer: Self-pay | Admitting: Family

## 2023-08-24 DIAGNOSIS — E669 Obesity, unspecified: Secondary | ICD-10-CM

## 2023-08-24 MED ORDER — WEGOVY 2.4 MG/0.75ML ~~LOC~~ SOAJ
2.4000 mg | SUBCUTANEOUS | 3 refills | Status: DC
  Filled 2023-08-24: qty 3, 28d supply, fill #0
  Filled 2023-09-20: qty 3, 28d supply, fill #1

## 2023-09-02 ENCOUNTER — Encounter: Payer: Self-pay | Admitting: Internal Medicine

## 2023-09-03 ENCOUNTER — Ambulatory Visit: Admitting: Family Medicine

## 2023-09-21 ENCOUNTER — Other Ambulatory Visit: Payer: Self-pay

## 2023-10-08 ENCOUNTER — Encounter: Payer: Self-pay | Admitting: Family

## 2023-10-08 ENCOUNTER — Other Ambulatory Visit: Payer: Self-pay

## 2023-10-08 ENCOUNTER — Ambulatory Visit: Payer: 59 | Admitting: Family

## 2023-10-08 ENCOUNTER — Encounter: Payer: Self-pay | Admitting: Internal Medicine

## 2023-10-08 VITALS — BP 150/100 | HR 72 | Temp 96.7°F | Ht 67.0 in

## 2023-10-08 DIAGNOSIS — Z13220 Encounter for screening for lipoid disorders: Secondary | ICD-10-CM

## 2023-10-08 DIAGNOSIS — E669 Obesity, unspecified: Secondary | ICD-10-CM

## 2023-10-08 DIAGNOSIS — F32A Depression, unspecified: Secondary | ICD-10-CM

## 2023-10-08 DIAGNOSIS — Z136 Encounter for screening for cardiovascular disorders: Secondary | ICD-10-CM

## 2023-10-08 DIAGNOSIS — F411 Generalized anxiety disorder: Secondary | ICD-10-CM

## 2023-10-08 DIAGNOSIS — I10 Essential (primary) hypertension: Secondary | ICD-10-CM

## 2023-10-08 DIAGNOSIS — F419 Anxiety disorder, unspecified: Secondary | ICD-10-CM

## 2023-10-08 DIAGNOSIS — E559 Vitamin D deficiency, unspecified: Secondary | ICD-10-CM

## 2023-10-08 MED ORDER — TIRZEPATIDE-WEIGHT MANAGEMENT 5 MG/0.5ML ~~LOC~~ SOLN
5.0000 mg | SUBCUTANEOUS | 1 refills | Status: DC
Start: 2023-10-08 — End: 2023-10-21
  Filled 2023-10-08: qty 2, 28d supply, fill #0

## 2023-10-08 MED ORDER — HYDROXYZINE HCL 10 MG PO TABS
10.0000 mg | ORAL_TABLET | Freq: Two times a day (BID) | ORAL | 2 refills | Status: DC | PRN
  Filled 2023-10-08: qty 30, 8d supply, fill #0

## 2023-10-08 MED ORDER — HYDROCHLOROTHIAZIDE 25 MG PO TABS: 25.0000 mg | ORAL_TABLET | Freq: Every day | ORAL | Status: DC

## 2023-10-08 NOTE — Progress Notes (Signed)
 Assessment & Plan:  Obesity, unspecified class, unspecified obesity type, unspecified whether serious comorbidity present Assessment & Plan: She has not lost weight on Wegovy  2.4mg .  Stop Wegovy , start Zepbound 5 mg.  Counseled on side effects, titration.   Orders: -     Tirzepatide-Weight Management; Inject 5 mg into the skin once a week.  Dispense: 2 mL; Refill: 1  Primary hypertension Assessment & Plan: Elevated today; she is quite tearful.  She has also stopped amlodipine  d/t dizziness ( resolved).  We discussed monitoring her blood pressure prior to escalating regimen at this time.  She will call me with blood pressure readings.  Continue losartan  100 mg daily, hydrochlorothiazide  25 mg daily  Orders: -     VITAMIN D  25 Hydroxy (Vit-D Deficiency, Fractures) -     CBC with Differential/Platelet -     Comprehensive metabolic panel with GFR -     Hemoglobin A1c -     Lipid panel -     TSH -     Tirzepatide-Weight Management; Inject 5 mg into the skin once a week.  Dispense: 2 mL; Refill: 1 -     hydroCHLOROthiazide ; Take 1 tablet (25 mg total) by mouth daily.  GAD (generalized anxiety disorder) -     hydrOXYzine  HCl; Take 1-2 tablets (10-20 mg total) by mouth 2 (two) times daily as needed for anxiety.  Dispense: 30 tablet; Refill: 2  Avitaminosis D  Encounter for lipid screening for cardiovascular disease -     VITAMIN D  25 Hydroxy (Vit-D Deficiency, Fractures)  Anxiety and depression Assessment & Plan: Uncontrolled as of this morning due to stress with relationship.  Continue Wellbutrin  300 mg daily.  Start Atarax  10-20 mg twice daily as needed      Return precautions given.   Risks, benefits, and alternatives of the medications and treatment plan prescribed today were discussed, and patient expressed understanding.   Education regarding symptom management and diagnosis given to patient on AVS either electronically or printed.  Return in about 2 weeks (around  10/22/2023).  Bascom Bossier, FNP  Subjective:    Patient ID: Anna Mcgee, female    DOB: 07-26-1960, 63 y.o.   MRN: [id number]  CC: Anna Mcgee is a 63 y.o. female who presents today for follow up.   HPI: Tearful today over a long-term relationship of 2 years.  She did not sleep well last night.  She does not think that she can work today.       She has gained weight on wegovy  2.4 She has been on phentermine in the past  She stopped taking amlodipine  10mg  as she feels 'weird', since resolved with stopping medication. Describes as dizziness.  Compliant with hvctz  however taking 25mg  instead of half tablet. Complaint with losartan  100mg .  No associated CP, sob.  Allergies: Amlodipine  and Penicillins Current Outpatient Medications on File Prior to Visit  Medication Sig Dispense Refill   buPROPion  (WELLBUTRIN  XL) 300 MG 24 hr tablet TAKE 1 TABLET(300 MG) BY MOUTH EVERY MORNING 90 tablet 3   Cholecalciferol (VITAMIN D3) 125 MCG (5000 UT) CAPS Take 1 capsule (5,000 Units total) by mouth daily. 30 capsule    losartan  (COZAAR ) 100 MG tablet Take 1 tablet (100 mg total) by mouth daily. 90 tablet 3   meloxicam  (MOBIC ) 7.5 MG tablet Take 1 tablet (7.5 mg total) by mouth daily as needed for pain. Don't take for more than two weeks 30 tablet 0   No current facility-administered medications  on file prior to visit.    Review of Systems  Constitutional:  Negative for chills and fever.  Respiratory:  Negative for cough.   Cardiovascular:  Negative for chest pain and palpitations.  Gastrointestinal:  Negative for nausea and vomiting.  Psychiatric/Behavioral:  Positive for sleep disturbance. Negative for suicidal ideas. The patient is nervous/anxious.       Objective:    BP (!) 150/100   Pulse 72   Temp (!) 96.7 F (35.9 C) (Oral)   Ht 5\' 7"  (1.702 m)   SpO2 95%   BMI 32.12 kg/m  BP Readings from Last 3 Encounters:  10/08/23 (!) 150/100  04/09/23 139/80  01/22/23 136/82    Wt Readings from Last 3 Encounters:  01/22/23 205 lb 1.6 oz (93 kg)  10/20/22 205 lb (93 kg)  08/29/22 205 lb 1.6 oz (93 kg)      10/08/2023    8:07 AM 04/09/2023    8:24 AM 01/22/2023    8:14 AM  Depression screen PHQ 2/9  Decreased Interest 0 0 0  Down, Depressed, Hopeless 0 0 0  PHQ - 2 Score 0 0 0  Altered sleeping 0 0 0  Tired, decreased energy 0 1 3  Change in appetite 0 0 0  Feeling bad or failure about yourself  0 0 0  Trouble concentrating 0 0 0  Moving slowly or fidgety/restless 0 0 0  Suicidal thoughts 0 0 0  PHQ-9 Score 0 1 3  Difficult doing work/chores Not difficult at all Not difficult at all Not difficult at all    Physical Exam Vitals reviewed.  Constitutional:      Appearance: She is well-developed.  Eyes:     Conjunctiva/sclera: Conjunctivae normal.  Cardiovascular:     Rate and Rhythm: Normal rate and regular rhythm.     Pulses: Normal pulses.     Heart sounds: Normal heart sounds.  Pulmonary:     Effort: Pulmonary effort is normal.     Breath sounds: Normal breath sounds. No wheezing, rhonchi or rales.  Skin:    General: Skin is warm and dry.  Neurological:     Mental Status: She is alert.  Psychiatric:        Speech: Speech normal.        Behavior: Behavior normal.        Thought Content: Thought content normal.

## 2023-10-08 NOTE — Assessment & Plan Note (Signed)
 Uncontrolled as of this morning due to stress with relationship.  Continue Wellbutrin  300 mg daily.  Start Atarax  10-20 mg twice daily as needed

## 2023-10-08 NOTE — Assessment & Plan Note (Signed)
 She has not lost weight on Wegovy  2.4mg .  Stop Wegovy , start Zepbound 5 mg.  Counseled on side effects, titration.

## 2023-10-08 NOTE — Patient Instructions (Signed)
 Start Atarax  as needed for anxiety. Please note that I am thinking about you and let me know anything.  Blood pressure elevated today however suspect grief, emotions are playing a role.   Monitor blood pressure at home and me 5-6 reading on separate days. Goal is less than 120/80, based on newest guidelines, however we certainly want to be less than 130/80;  if persistently higher, please make sooner follow up appointment so we can recheck you blood pressure and manage/ adjust medications.

## 2023-10-08 NOTE — Assessment & Plan Note (Signed)
 Elevated today; she is quite tearful.  She has also stopped amlodipine  d/t dizziness ( resolved).  We discussed monitoring her blood pressure prior to escalating regimen at this time.  She will call me with blood pressure readings.  Continue losartan  100 mg daily, hydrochlorothiazide  25 mg daily

## 2023-10-13 LAB — CBC WITH DIFFERENTIAL/PLATELET
Basophils Absolute: 0 10*3/uL (ref 0.0–0.2)
Basos: 1 %
EOS (ABSOLUTE): 0 10*3/uL (ref 0.0–0.4)
Eos: 1 %
Hematocrit: 38.7 % (ref 34.0–46.6)
Hemoglobin: 13.2 g/dL (ref 11.1–15.9)
Immature Grans (Abs): 0 10*3/uL (ref 0.0–0.1)
Immature Granulocytes: 0 %
Lymphocytes Absolute: 1.8 10*3/uL (ref 0.7–3.1)
Lymphs: 28 %
MCH: 30.3 pg (ref 26.6–33.0)
MCHC: 34.1 g/dL (ref 31.5–35.7)
MCV: 89 fL (ref 79–97)
Monocytes Absolute: 0.4 10*3/uL (ref 0.1–0.9)
Monocytes: 6 %
Neutrophils Absolute: 4 10*3/uL (ref 1.4–7.0)
Neutrophils: 64 %
Platelets: 311 10*3/uL (ref 150–450)
RBC: 4.36 x10E6/uL (ref 3.77–5.28)
RDW: 12.2 % (ref 11.7–15.4)
WBC: 6.3 10*3/uL (ref 3.4–10.8)

## 2023-10-13 LAB — COMPREHENSIVE METABOLIC PANEL WITH GFR
ALT: 19 IU/L (ref 0–32)
AST: 24 IU/L (ref 0–40)
Albumin: 4.8 g/dL (ref 3.9–4.9)
Alkaline Phosphatase: 83 IU/L (ref 44–121)
BUN/Creatinine Ratio: 14 (ref 12–28)
BUN: 14 mg/dL (ref 8–27)
Bilirubin Total: 0.3 mg/dL (ref 0.0–1.2)
CO2: 23 mmol/L (ref 20–29)
Calcium: 9.8 mg/dL (ref 8.7–10.3)
Chloride: 97 mmol/L (ref 96–106)
Creatinine, Ser: 1.02 mg/dL — ABNORMAL HIGH (ref 0.57–1.00)
Globulin, Total: 2.7 g/dL (ref 1.5–4.5)
Glucose: 103 mg/dL — ABNORMAL HIGH (ref 70–99)
Potassium: 3.6 mmol/L (ref 3.5–5.2)
Sodium: 137 mmol/L (ref 134–144)
Total Protein: 7.5 g/dL (ref 6.0–8.5)
eGFR: 61 mL/min/{1.73_m2} (ref >59)

## 2023-10-13 LAB — LIPID PANEL
Chol/HDL Ratio: 1.8 ratio (ref 0.0–4.4)
Cholesterol, Total: 194 mg/dL (ref 100–199)
HDL: 109 mg/dL (ref >39)
LDL Chol Calc (NIH): 75 mg/dL (ref 0–99)
Triglycerides: 54 mg/dL (ref 0–149)
VLDL Cholesterol Cal: 10 mg/dL (ref 5–40)

## 2023-10-13 LAB — HEMOGLOBIN A1C
Est. average glucose Bld gHb Est-mCnc: 108 mg/dL
Hgb A1c MFr Bld: 5.4 % (ref 4.8–5.6)

## 2023-10-13 LAB — VITAMIN D 25 HYDROXY (VIT D DEFICIENCY, FRACTURES): Vit D, 25-Hydroxy: 39.9 ng/mL (ref 30.0–100.0)

## 2023-10-13 LAB — TSH: TSH: 0.907 u[IU]/mL (ref 0.450–4.500)

## 2023-10-20 ENCOUNTER — Other Ambulatory Visit: Payer: Self-pay

## 2023-10-20 ENCOUNTER — Encounter: Payer: Self-pay | Admitting: Family

## 2023-10-21 ENCOUNTER — Other Ambulatory Visit: Payer: Self-pay

## 2023-10-21 ENCOUNTER — Telehealth: Payer: Self-pay

## 2023-10-21 ENCOUNTER — Encounter: Payer: Self-pay | Admitting: Internal Medicine

## 2023-10-21 ENCOUNTER — Other Ambulatory Visit (HOSPITAL_COMMUNITY): Payer: Self-pay

## 2023-10-21 MED ORDER — TIRZEPATIDE-WEIGHT MANAGEMENT 5 MG/0.5ML ~~LOC~~ SOAJ
5.0000 mg | SUBCUTANEOUS | 1 refills | Status: DC
Start: 2023-10-21 — End: 2023-11-13
  Filled 2023-10-21 (×2): qty 2, 28d supply, fill #0

## 2023-10-21 NOTE — Addendum Note (Signed)
 Addended by: Posey Jasmin on: 10/21/2023 08:35 AM   Modules accepted: Orders

## 2023-10-21 NOTE — Telephone Encounter (Signed)
 Spoke to pt informed her that I sent in the pens instead of the vials

## 2023-10-21 NOTE — Telephone Encounter (Signed)
 Copied from CRM [id number]. Topic: Clinical - Prescription Issue >> Oct 21, 2023  8:07 AM Elle L wrote: Reason for CRM: The patient states her tirzepatide 5 MG/0.5ML injection vial prescription is supposed to be pens instead of vials.

## 2023-10-21 NOTE — Telephone Encounter (Signed)
 Pharmacy Patient Advocate Encounter  Received notification from  [employer]  that Prior Authorization for Zepbound 5 has been APPROVED from 10/21/23 to 04/22/24. Ran test claim, Copay is $24.99 for a 28 day supply. This test claim was processed through [facility]- copay amounts may vary at other pharmacies due to pharmacy/plan contracts, or as the patient moves through the different stages of their insurance plan.   PA #/Case ID/Reference #: Geroge Konig

## 2023-10-21 NOTE — Telephone Encounter (Signed)
 Pharmacy Patient Advocate Encounter   Received notification from Patient Pharmacy that prior authorization for Zepbound is required/requested.   Insurance verification completed.   The patient is insured through  [employer]  .   Per test claim: PA required; PA submitted to above mentioned insurance via CoverMyMeds Key/confirmation #/EOC [employer] Status is pending Insurance prefers:

## 2023-10-22 ENCOUNTER — Encounter: Payer: Self-pay | Admitting: Internal Medicine

## 2023-10-23 ENCOUNTER — Ambulatory Visit: Admitting: Family

## 2023-10-23 ENCOUNTER — Encounter: Payer: Self-pay | Admitting: Family

## 2023-10-23 ENCOUNTER — Other Ambulatory Visit: Payer: Self-pay

## 2023-10-23 VITALS — BP 136/80 | HR 76 | Temp 97.6°F | Ht 67.0 in | Wt 205.1 lb

## 2023-10-23 DIAGNOSIS — F32A Depression, unspecified: Secondary | ICD-10-CM | POA: Diagnosis not present

## 2023-10-23 DIAGNOSIS — F419 Anxiety disorder, unspecified: Secondary | ICD-10-CM

## 2023-10-23 DIAGNOSIS — J309 Allergic rhinitis, unspecified: Secondary | ICD-10-CM

## 2023-10-23 MED ORDER — CETIRIZINE HCL 10 MG PO TABS
10.0000 mg | ORAL_TABLET | Freq: Every day | ORAL | 3 refills | Status: DC
  Filled 2023-10-23: qty 30, 30d supply, fill #0
  Filled 2023-11-16: qty 30, 30d supply, fill #1

## 2023-10-23 NOTE — Assessment & Plan Note (Addendum)
 Chronic, improved.  Completed FMLA paperwork with patient today.  Continue Wellbutrin  300 mg daily, Atarax  10 to 20 mg twice daily

## 2023-10-23 NOTE — Progress Notes (Signed)
 Assessment & Plan:  Anxiety and depression Assessment & Plan: Chronic, improved.  Completed FMLA paperwork with patient today.  Continue Wellbutrin  300 mg daily, Atarax  10 to 20 mg twice daily   Allergic rhinitis, unspecified seasonality, unspecified trigger -     Cetirizine  HCl; Take 1 tablet (10 mg total) by mouth daily.  Dispense: 90 tablet; Refill: 3     Return precautions given.   Risks, benefits, and alternatives of the medications and treatment plan prescribed today were discussed, and patient expressed understanding.   Education regarding symptom management and diagnosis given to patient on AVS either electronically or printed.  Return in about 3 months (around 01/23/2024).  Anna Bossier, FNP  Subjective:    Patient ID: Anna Mcgee, female    DOB: [birth year], 63 y.o.   MRN: [id number]  CC: Anna Mcgee is a 63 y.o. female who presents today for follow up.   HPI: Feels better today No new complaints.  Anxiety improved.  Sleeping better  Started on Atarax  10 -20mg  BID prn at last visit  She has brought FMLA paperwork to complete as she cares for her elderly mother and for GAD.     no longer on Wegovy , started Zepbound  Compliant with losartan  100 mg daily, hydrochlorothiazide  25 mg daily  Allergies: Amlodipine  and Penicillins Current Outpatient Medications on File Prior to Visit  Medication Sig Dispense Refill   buPROPion  (WELLBUTRIN  XL) 300 MG 24 hr tablet TAKE 1 TABLET(300 MG) BY MOUTH EVERY MORNING 90 tablet 3   Cholecalciferol (VITAMIN D3) 125 MCG (5000 UT) CAPS Take 1 capsule (5,000 Units total) by mouth daily. 30 capsule    hydrochlorothiazide  (HYDRODIURIL ) 25 MG tablet Take 1 tablet (25 mg total) by mouth daily.     hydrOXYzine  (ATARAX ) 10 MG tablet Take 1-2 tablets (10-20 mg total) by mouth 2 (two) times daily as needed for anxiety. 30 tablet 2   losartan  (COZAAR ) 100 MG tablet Take 1 tablet (100 mg total) by mouth daily. 90 tablet 3    tirzepatide  (ZEPBOUND ) 5 MG/0.5ML Pen Inject 5 mg into the skin once a week. 4 mL 1   No current facility-administered medications on file prior to visit.    Review of Systems  Constitutional:  Negative for chills and fever.  Respiratory:  Negative for cough.   Cardiovascular:  Negative for chest pain and palpitations.  Gastrointestinal:  Negative for nausea and vomiting.  Psychiatric/Behavioral:  Negative for sleep disturbance. The patient is not nervous/anxious.       Objective:    BP 136/80   Pulse 76   Temp 97.6 F (36.4 C) (Oral)   Ht 5\' 7"  (1.702 m)   Wt 205 lb 1.6 oz (93 kg)   SpO2 98%   BMI 32.12 kg/m  BP Readings from Last 3 Encounters:  10/23/23 136/80  10/08/23 (!) 150/100  04/09/23 139/80   Wt Readings from Last 3 Encounters:  10/23/23 205 lb 1.6 oz (93 kg)  01/22/23 205 lb 1.6 oz (93 kg)  10/20/22 205 lb (93 kg)      10/23/2023   11:10 AM 10/08/2023    8:07 AM 04/09/2023    8:24 AM  Depression screen PHQ 2/9  Decreased Interest 0 0 0  Down, Depressed, Hopeless 0 0 0  PHQ - 2 Score 0 0 0  Altered sleeping 0 0 0  Tired, decreased energy 0 0 1  Change in appetite 0 0 0  Feeling bad or failure about yourself  0 0  0  Trouble concentrating 0 0 0  Moving slowly or fidgety/restless 0 0 0  Suicidal thoughts 0 0 0  PHQ-9 Score 0 0 1  Difficult doing work/chores Not difficult at all Not difficult at all Not difficult at all     Physical Exam Vitals reviewed.  Constitutional:      Appearance: She is well-developed.  Eyes:     Conjunctiva/sclera: Conjunctivae normal.  Cardiovascular:     Rate and Rhythm: Normal rate and regular rhythm.     Pulses: Normal pulses.     Heart sounds: Normal heart sounds.  Pulmonary:     Effort: Pulmonary effort is normal.     Breath sounds: Normal breath sounds. No wheezing, rhonchi or rales.  Skin:    General: Skin is warm and dry.  Neurological:     Mental Status: She is alert.  Psychiatric:        Speech: Speech  normal.        Behavior: Behavior normal.        Thought Content: Thought content normal.

## 2023-10-28 ENCOUNTER — Telehealth: Payer: Self-pay

## 2023-10-28 NOTE — Telephone Encounter (Signed)
 Copied from CRM [id number]. Topic: General - Other >> Oct 28, 2023 12:29 PM Alyse July wrote: Reason for CRM: patient would like to stop by the office to have a section of her FMLA paperwork completed that was previously incomplete. Patient plans to stop by the office around 11:45 am to have this completed.

## 2023-10-28 NOTE — Telephone Encounter (Signed)
 Spoke to pt she will stop by tomorrow on 10/29/23 for me to look at paperwork and make sure it is correct. Informed her to tell them to call me so I can come up and get paperwork

## 2023-10-29 ENCOUNTER — Telehealth: Payer: Self-pay

## 2023-10-29 NOTE — Telephone Encounter (Signed)
 LVM to call back.

## 2023-10-29 NOTE — Telephone Encounter (Signed)
 FMLA  paperwork has been completed copy was made one given to pt and one scanned into the chart

## 2023-11-13 ENCOUNTER — Other Ambulatory Visit: Payer: Self-pay

## 2023-11-13 ENCOUNTER — Encounter: Payer: Self-pay | Admitting: Family

## 2023-11-13 MED ORDER — TIRZEPATIDE-WEIGHT MANAGEMENT 7.5 MG/0.5ML ~~LOC~~ SOAJ
7.5000 mg | SUBCUTANEOUS | 3 refills | Status: DC
  Filled 2023-11-13: qty 2, 28d supply, fill #0

## 2023-11-13 NOTE — Telephone Encounter (Signed)
 Medication Pended

## 2023-12-09 ENCOUNTER — Encounter: Payer: Self-pay | Admitting: Family

## 2023-12-10 ENCOUNTER — Other Ambulatory Visit: Payer: Self-pay

## 2023-12-10 ENCOUNTER — Telehealth: Payer: Self-pay

## 2023-12-10 DIAGNOSIS — J309 Allergic rhinitis, unspecified: Secondary | ICD-10-CM

## 2023-12-10 MED ORDER — CETIRIZINE HCL 10 MG PO TABS
10.0000 mg | ORAL_TABLET | Freq: Every day | ORAL | 3 refills | Status: AC
  Filled 2023-12-10: qty 30, 30d supply, fill #0
  Filled 2024-01-04: qty 30, 30d supply, fill #1
  Filled 2024-02-26: qty 30, 30d supply, fill #2
  Filled 2024-03-28: qty 30, 30d supply, fill #3
  Filled 2024-05-11: qty 30, 30d supply, fill #4
  Filled 2024-06-06: qty 30, 30d supply, fill #5

## 2023-12-10 MED ORDER — TIRZEPATIDE-WEIGHT MANAGEMENT 10 MG/0.5ML ~~LOC~~ SOAJ
10.0000 mg | SUBCUTANEOUS | 2 refills | Status: DC
  Filled 2023-12-10: qty 2, 28d supply, fill #0
  Filled 2024-01-04: qty 2, 28d supply, fill #1

## 2023-12-10 NOTE — Telephone Encounter (Unsigned)
 Copied from CRM [id number]. Topic: Clinical - Medication Question >> Dec 10, 2023 12:08 PM Viola F wrote: Patient returned [facility] phone call, I relayed the message regarding the Zepbound  10 mg and Zyrtec  being sent to the pharmacy and scheduled patient for physical 04/11/24 since she was due.

## 2024-02-01 ENCOUNTER — Other Ambulatory Visit: Payer: Self-pay | Admitting: Family

## 2024-02-01 ENCOUNTER — Other Ambulatory Visit: Payer: Self-pay

## 2024-02-01 ENCOUNTER — Encounter: Payer: Self-pay | Admitting: Family

## 2024-02-01 MED ORDER — ZEPBOUND 12.5 MG/0.5ML ~~LOC~~ SOAJ
12.5000 mg | SUBCUTANEOUS | 1 refills | Status: DC
  Filled 2024-02-01: qty 2, 28d supply, fill #0
  Filled 2024-02-26: qty 2, 28d supply, fill #1

## 2024-02-01 NOTE — Telephone Encounter (Signed)
 Noted. Thanks.

## 2024-03-22 ENCOUNTER — Other Ambulatory Visit: Payer: Self-pay | Admitting: Family

## 2024-03-22 ENCOUNTER — Other Ambulatory Visit: Payer: Self-pay

## 2024-03-24 ENCOUNTER — Other Ambulatory Visit: Payer: Self-pay

## 2024-03-24 ENCOUNTER — Other Ambulatory Visit: Payer: Self-pay | Admitting: Family

## 2024-03-25 ENCOUNTER — Other Ambulatory Visit: Payer: Self-pay

## 2024-03-25 MED FILL — Tirzepatide (Weight Mngmt) Soln Auto-Injector 12.5 MG/0.5ML: SUBCUTANEOUS | 28 days supply | Qty: 2 | Fill #0 | Status: AC

## 2024-03-27 ENCOUNTER — Other Ambulatory Visit: Payer: Self-pay | Admitting: Family

## 2024-03-27 DIAGNOSIS — I10 Essential (primary) hypertension: Secondary | ICD-10-CM

## 2024-03-27 DIAGNOSIS — F339 Major depressive disorder, recurrent, unspecified: Secondary | ICD-10-CM

## 2024-03-28 ENCOUNTER — Other Ambulatory Visit (HOSPITAL_COMMUNITY): Payer: Self-pay

## 2024-04-06 ENCOUNTER — Encounter: Payer: Self-pay | Admitting: Family

## 2024-04-06 DIAGNOSIS — R899 Unspecified abnormal finding in specimens from other organs, systems and tissues: Secondary | ICD-10-CM

## 2024-04-11 ENCOUNTER — Encounter: Payer: Self-pay | Admitting: Family

## 2024-04-11 ENCOUNTER — Ambulatory Visit: Admitting: Family

## 2024-04-11 ENCOUNTER — Other Ambulatory Visit: Payer: Self-pay

## 2024-04-11 VITALS — BP 126/84 | HR 70 | Temp 98.2°F | Ht 67.0 in | Wt 211.0 lb

## 2024-04-11 DIAGNOSIS — I10 Essential (primary) hypertension: Secondary | ICD-10-CM | POA: Diagnosis not present

## 2024-04-11 DIAGNOSIS — Z136 Encounter for screening for cardiovascular disorders: Secondary | ICD-10-CM

## 2024-04-11 DIAGNOSIS — Z23 Encounter for immunization: Secondary | ICD-10-CM | POA: Diagnosis not present

## 2024-04-11 DIAGNOSIS — F411 Generalized anxiety disorder: Secondary | ICD-10-CM

## 2024-04-11 DIAGNOSIS — Z13220 Encounter for screening for lipoid disorders: Secondary | ICD-10-CM | POA: Diagnosis not present

## 2024-04-11 DIAGNOSIS — Z0000 Encounter for general adult medical examination without abnormal findings: Secondary | ICD-10-CM

## 2024-04-11 DIAGNOSIS — Z1231 Encounter for screening mammogram for malignant neoplasm of breast: Secondary | ICD-10-CM | POA: Diagnosis not present

## 2024-04-11 DIAGNOSIS — E669 Obesity, unspecified: Secondary | ICD-10-CM

## 2024-04-11 MED ORDER — HYDROCHLOROTHIAZIDE 25 MG PO TABS: 12.5000 mg | ORAL_TABLET | Freq: Every day | ORAL | Status: DC

## 2024-04-11 MED ORDER — HYDROXYZINE HCL 10 MG PO TABS
10.0000 mg | ORAL_TABLET | Freq: Two times a day (BID) | ORAL | Status: AC | PRN
Start: 2024-04-11 — End: ?

## 2024-04-11 MED ORDER — ZEPBOUND 15 MG/0.5ML ~~LOC~~ SOAJ
15.0000 mg | SUBCUTANEOUS | 2 refills | Status: DC
  Filled 2024-04-11: qty 2, 28d supply, fill #0
  Filled 2024-05-11: qty 2, 28d supply, fill #1
  Filled 2024-06-05: qty 2, 28d supply, fill #2

## 2024-04-11 NOTE — Telephone Encounter (Signed)
 Noted

## 2024-04-11 NOTE — Progress Notes (Unsigned)
 Assessment & Plan:  Encounter for screening mammogram for malignant neoplasm of breast -     3D Screening Mammogram, Left and Right; Future  GAD (generalized anxiety disorder) -     hydrOXYzine  HCl; Take 1-2 tablets (10-20 mg total) by mouth 2 (two) times daily as needed for anxiety.  Primary hypertension -     TSH -     VITAMIN D  25 Hydroxy (Vit-D Deficiency, Fractures) -     hydroCHLOROthiazide ; Take 0.5 tablets (12.5 mg total) by mouth daily. -     Lipid panel -     Comprehensive metabolic panel with GFR -     CBC with Differential/Platelet -     Hemoglobin A1c -     Lipid panel  Encounter for lipid screening for cardiovascular disease -     VITAMIN D  25 Hydroxy (Vit-D Deficiency, Fractures)  Need for influenza vaccination -     Flu vaccine trivalent PF, 6mos and older(Flulaval,Afluria,Fluarix,Fluzone)  Obesity, unspecified class, unspecified obesity type, unspecified whether serious comorbidity present Assessment & Plan: Increase zepbound  to 15mg  as she has plateaued. Counseled on side effects.  Orders: -     Zepbound ; Inject 15 mg into the skin once a week.  Dispense: 6 mL; Refill: 2  Annual physical exam Assessment & Plan: CBE performed. Deferred pelvic exam in the absence of symptoms and pap smear is UTD.  Note to patient ( after she left) to consider [place] lung cancer screening program.  She will schedule mammogram.       Return precautions given.   Risks, benefits, and alternatives of the medications and treatment plan prescribed today were discussed, and patient expressed understanding.   Education regarding symptom management and diagnosis given to patient on AVS either electronically or printed.  No follow-ups on file.  Anna Northern, FNP  Subjective:    Patient ID: Anna Mcgee, female    DOB: 05/27/1961, 63 y.o.   MRN: [id number]  CC: Anna Mcgee is a 63 y.o. female who presents today for physical exam.    HPI: HPI  Colorectal Cancer  Screening: 04/2020; no specimens collected; return in 10 years Breast Cancer Screening: Mammogram due Cervical Cancer Screening: UTD, 03/11/2022 NILM, neg HPV Bone Health screening/DEXA for 65+: No increased fracture risk. Defer screening at this time.  Lung Cancer Screening: Doesn't have 20 year pack year history and age > 48 years yo 45 years        Tetanus - UTD        Pneumococcal - Candidate for. She will do at follow up  Exercise: No regular exercise.   Alcohol use:  rare Smoking/tobacco use: former smoker.    Health Maintenance  Topic Date Due   Hepatitis B Vaccine (2 of 3 - 19+ 3-dose series) 02/04/1998   Breast Cancer Screening  Never done   Pneumococcal Vaccine for age over 27 (1 of 1 - PCV) Never done   Pap with HPV screening  03/12/2027   Colon Cancer Screening  05/03/2030   DTaP/Tdap/Td vaccine (3 - Td or Tdap) 04/26/2033   Flu Shot  Completed   Hepatitis C Screening  Completed   HIV Screening  Completed   Zoster (Shingles) Vaccine  Completed   HPV Vaccine  Aged Out   Meningitis B Vaccine  Aged Out   COVID-19 Vaccine  Discontinued   Cologuard (Stool DNA test)  Discontinued    ALLERGIES: Amlodipine  and Penicillins  Current Outpatient Medications on File Prior to Visit  Medication  Sig Dispense Refill   buPROPion  (WELLBUTRIN  XL) 300 MG 24 hr tablet TAKE 1 TABLET(300 MG) BY MOUTH EVERY MORNING 90 tablet 0   cetirizine  (ZYRTEC ) 10 MG tablet Take 1 tablet (10 mg total) by mouth daily. 90 tablet 3   losartan  (COZAAR ) 100 MG tablet TAKE 1 TABLET(100 MG) BY MOUTH DAILY 90 tablet 0   Cholecalciferol (VITAMIN D3) 125 MCG (5000 UT) CAPS Take 1 capsule (5,000 Units total) by mouth daily. (Patient not taking: Reported on 04/11/2024) 30 capsule    No current facility-administered medications on file prior to visit.    Review of Systems  Constitutional:  Negative for chills, fever and unexpected weight change.  HENT:  Negative for congestion.   Respiratory:  Negative for  cough.   Cardiovascular:  Negative for chest pain, palpitations and leg swelling.  Gastrointestinal:  Negative for nausea and vomiting.  Musculoskeletal:  Negative for arthralgias and myalgias.  Skin:  Negative for rash.  Neurological:  Negative for headaches.  Hematological:  Negative for adenopathy.  Psychiatric/Behavioral:  Negative for confusion.       Objective:    BP 126/84   Pulse 70   Temp 98.2 F (36.8 C) (Oral)   Ht 5' 7 (1.702 m)   Wt 211 lb (95.7 kg)   SpO2 98%   BMI 33.05 kg/m   BP Readings from Last 3 Encounters:  04/11/24 126/84  10/23/23 136/80  10/08/23 (!) 150/100   Wt Readings from Last 3 Encounters:  04/11/24 211 lb (95.7 kg)  10/23/23 205 lb 1.6 oz (93 kg)  01/22/23 205 lb 1.6 oz (93 kg)    Physical Exam Vitals reviewed.  Constitutional:      Appearance: Normal appearance. She is well-developed.  Eyes:     Conjunctiva/sclera: Conjunctivae normal.  Neck:     Thyroid : No thyroid  mass or thyromegaly.  Cardiovascular:     Rate and Rhythm: Normal rate and regular rhythm.     Pulses: Normal pulses.     Heart sounds: Normal heart sounds.  Pulmonary:     Effort: Pulmonary effort is normal.     Breath sounds: Normal breath sounds. No wheezing, rhonchi or rales.  Chest:  Breasts:    Breasts are symmetrical.     Right: No inverted nipple, mass, nipple discharge, skin change or tenderness.     Left: No inverted nipple, mass, nipple discharge, skin change or tenderness.  Abdominal:     General: Bowel sounds are normal. There is no distension.     Palpations: Abdomen is soft. Abdomen is not rigid. There is no fluid wave or mass.     Tenderness: There is no abdominal tenderness. There is no guarding or rebound.  Lymphadenopathy:     Head:     Right side of head: No submental, submandibular, tonsillar, preauricular, posterior auricular or occipital adenopathy.     Left side of head: No submental, submandibular, tonsillar, preauricular, posterior  auricular or occipital adenopathy.     Cervical: No cervical adenopathy.     Right cervical: No superficial, deep or posterior cervical adenopathy.    Left cervical: No superficial, deep or posterior cervical adenopathy.  Skin:    General: Skin is warm and dry.  Neurological:     Mental Status: She is alert.  Psychiatric:        Speech: Speech normal.        Behavior: Behavior normal.        Thought Content: Thought content normal.

## 2024-04-11 NOTE — Assessment & Plan Note (Signed)
 CBE performed. Deferred pelvic exam in the absence of symptoms and pap smear is UTD.  Note to patient ( after she left) to consider CT lung cancer screening program.  She will schedule mammogram.

## 2024-04-11 NOTE — Assessment & Plan Note (Signed)
 Increase zepbound  to 15mg  as she has plateaued. Counseled on side effects.

## 2024-04-11 NOTE — Patient Instructions (Addendum)
 Please call  and schedule your 3D mammogram and /or bone density scan as we discussed.   [facility]  ( new location in 2023)  [address], [place], [place] [address]  [place], [place]  [phone]    Increase zepbound  15mg   Please download Myfitness Pal App ( basic version is free).   You may log every thing you eat for even 2-3 days to get a better of idea of total daily calories. To loose weight, we have to create caloric deficit to loose weight. The goal is 1-2 lbs per week of weight loss.   Excellent article below from [facility].   https://www.health.criticalz.it  Calorie counting made easy  Eat less, exercise more. If only it were that simple! As most dieters know, losing weight can be very challenging. As this report details, a range of influences can affect how people gain and lose weight. But a basic understanding of how to tip your energy balance in favor of weight loss is a good place to start.  Start by determining how many calories you should consume each day. To do so, you need to know how many calories you need to maintain your current weight. Doing this requires a few simple calculations.  First, multiply your current weight by 15 -- that's roughly the number of calories per pound of body weight needed to maintain your current weight if you are moderately active. Moderately active means getting at least 30 minutes of physical activity a day in the form of exercise (walking at a brisk pace, climbing stairs, or active gardening). Let's say you're a woman who is 5 feet, 4 inches tall and weighs 155 pounds, and you need to lose about 15 pounds to put you in a healthy weight range. If you multiply 155 by 15, you will get 2,325, which is the number of calories per day that you need in order to maintain your current weight (weight-maintenance calories). To lose weight, you will need to get below that  total.  For example, to lose 1 to 2 pounds a week -- a rate that experts consider safe -- your food consumption should provide 500 to 1,000 calories less than your total weight-maintenance calories. If you need 2,325 calories a day to maintain your current weight, reduce your daily calories to between 1,325 and 1,825. If you are sedentary, you will also need to build more activity into your day. In order to lose at least a pound a week, try to do at least 30 minutes of physical activity on most days, and reduce your daily calorie intake by at least 500 calories. However, calorie intake should not fall below 1,200 a day in women or 1,500 a day in men, except under the supervision of a health professional. Eating too few calories can endanger your health by depriving you of needed nutrients.  Meeting your calorie target How can you meet your daily calorie target? One approach is to add up the number of calories per serving of all the foods that you eat, and then plan your menus accordingly. You can buy books that list calories per serving for many foods. In addition, the nutrition labels on all packaged foods and beverages provide calories per serving information. Make a point of reading the labels of the foods and drinks you use, noting the number of calories and the serving sizes. Many recipes published in cookbooks, newspapers, and magazines provide similar information.  If you hate counting calories, a different  approach is to restrict how much and how often you eat, and to eat meals that are low in calories. Dietary guidelines issued by the American Heart Association stress common sense in choosing your foods rather than focusing strictly on numbers, such as total calories or calories from fat. Whichever method you choose, research shows that a regular eating schedule -- with meals and snacks planned for certain times each day -- makes for the most successful approach. The same applies after you have lost  weight and want to keep it off. Sticking with an eating schedule increases your chance of maintaining your new weight.    This is  Dr. Tullo's  ( an amazing physician in my office!)  example of a  Low GI  Diet:  It will allow you to lose 4 to 8  lbs  per month if you follow it carefully.  Your goal with exercise is a minimum of 30 minutes of aerobic exercise 5 days per week (Walking does not count once it becomes easy!)    All of the foods can be found at grocery stores and in bulk at [employer].  The Atkins protein bars and shakes are available in more varieties at [employer], [employer] and [employer].     7 AM Breakfast:  Choose from the following:  Low carbohydrate Protein  Shakes (I recommend the  Premier Protein chocolate shakes,  EAS AdvantEdge Carb Control shakes  Or the Atkins shakes all are under 3 net carbs)     a scrambled egg/bacon/cheese burrito made with Mission's carb balance whole wheat tortilla  (about 10 net carbs )  Medical Laboratory Scientific Officer (basically a quiche without the pastry crust) that is eaten cold and very convenient way to get your eggs.  8 carbs)  If you make your own protein shakes, avoid bananas and pineapple,  And use low carb greek yogurt or original /unsweetened almond or soy milk    Avoid cereal and bananas, oatmeal and cream of wheat and grits. They are loaded with carbohydrates!   10 AM: high protein snack:  Protein bar by Atkins (the snack size, under 200 cal, usually < 6 net carbs).    A stick of cheese:  Around 1 carb,  100 cal     Dannon Light n Fit Greek Yogurt  (80 cal, 8 carbs)  Other so called protein bars and Greek yogurts tend to be loaded with carbohydrates.  Remember, in food advertising, the word energy is synonymous for  carbohydrate.  Lunch:   A Sandwich using the bread choices listed, Can use any  Eggs,  lunchmeat, grilled meat or canned tuna), avocado, regular mayo/mustard  and cheese.  A Salad using blue cheese,  ranch,  Goddess or vinagrette,  Avoid taco shells, croutons or confetti and no candied nuts but regular nuts OK.   No pretzels, nabs  or chips.  Pickles and miniature sweet peppers are a good low carb alternative that provide a crunch  The bread is the only source of carbohydrate in a sandwich and  can be decreased by trying some of the attached alternatives to traditional loaf bread   Avoid Low fat dressings, as well as [place] and [employer] dressings They are loaded with sugar!   3 PM/ Mid day  Snack:  Consider  1 ounce of  almonds, walnuts, pistachios, pecans, peanuts,  Macadamia nuts or a nut medley.  Avoid granola and granola bars   Mixed nuts are ok in  moderation as long as there are no raisins,  cranberries or dried fruit.   KIND bars are OK if you get the low glycemic index variety   Try the prosciutto/mozzarella cheese sticks by [employer]  In deli /backery section   High protein      6 PM  Dinner:     Meat/fowl/fish with a green salad, and either broccoli, cauliflower, green beans, spinach, brussel sprouts or  Lima beans. DO NOT BREAD THE PROTEIN!!      There is a low carb pasta by Dreamfield's that is acceptable and tastes great: only 5 digestible carbs/serving.( All grocery stores but BJs carry it ) Several ready made meals are available low carb:   Try Michel Angelo's chicken piccata or chicken or eggplant parm over low carb pasta.(Lowes and BJs)   [place] Carnitas (pulled pork, no sauce,  0 carbs) or his beef pot roast to make a dinner burrito (at [employer])  Pesto over low carb pasta (bj's sells a good quality pesto in the center refrigerated section of the deli   Try satueeing  Glenna Blonder with mushroooms as a good side   [employer] makes a mashed cauliflower that tastes like mashed potatoes  Whole wheat pasta is still full of digestible carbs and  Not as low in glycemic index as Dreamfield's.   Brown rice is still rice,  So skip the rice and noodles if you  eat Chinese or Thai (or at least limit to 1/2 cup)  9 PM snack :   Breyer's low carb fudgsicle or  ice cream bar (Carb Smart line), or  Weight Watcher's ice cream bar , or another no sugar added ice cream;  a serving of fresh berries/cherries with whipped cream   Cheese or DANNON'S LlGHT N FIT GREEK YOGURT  8 ounces of [place] unsweetened almond/cococunut milk    Treat yourself to a parfait made with whipped cream blueberiies, walnuts and vanilla greek yogurt  Avoid bananas, pineapple, grapes  and watermelon on a regular basis because they are high in sugar.  THINK OF THEM AS DESSERT  Remember that snack Substitutions should be less than 10 NET carbs per serving and meals < 20 carbs. Remember to subtract fiber grams to get the net carbs.  Health Maintenance for Postmenopausal Women Menopause is a normal process in which your ability to get pregnant comes to an end. This process happens slowly over many months or years, usually between the ages of 38 and 42. Menopause is complete when you have missed your menstrual period for 12 months. It is important to talk with your health care provider about some of the most common conditions that affect women after menopause (postmenopausal women). These include heart disease, cancer, and bone loss (osteoporosis). Adopting a healthy lifestyle and getting preventive care can help to promote your health and wellness. The actions you take can also lower your chances of developing some of these common conditions. What are the signs and symptoms of menopause? During menopause, you may have the following symptoms: Hot flashes. These can be moderate or severe. Night sweats. Decrease in sex drive. Mood swings. Headaches. Tiredness (fatigue). Irritability. Memory problems. Problems falling asleep or staying asleep. Talk with your health care provider about treatment options for your symptoms. Do I need hormone replacement therapy? Hormone  replacement therapy is effective in treating symptoms that are caused by menopause, such as hot flashes and night sweats. Hormone replacement carries certain risks, especially as you become older. If  you are thinking about using estrogen or estrogen with progestin, discuss the benefits and risks with your health care provider. How can I reduce my risk for heart disease and stroke? The risk of heart disease, heart attack, and stroke increases as you age. One of the causes may be a change in the body's hormones during menopause. This can affect how your body uses dietary fats, triglycerides, and cholesterol. Heart attack and stroke are medical emergencies. There are many things that you can do to help prevent heart disease and stroke. Watch your blood pressure High blood pressure causes heart disease and increases the risk of stroke. This is more likely to develop in people who have high blood pressure readings or are overweight. Have your blood pressure checked: Every 3-5 years if you are 2-71 years of age. Every year if you are 77 years old or older. Eat a healthy diet  Eat a diet that includes plenty of vegetables, fruits, low-fat dairy products, and lean protein. Do not eat a lot of foods that are high in solid fats, added sugars, or sodium. Get regular exercise Get regular exercise. This is one of the most important things you can do for your health. Most adults should: Try to exercise for at least 150 minutes each week. The exercise should increase your heart rate and make you sweat (moderate-intensity exercise). Try to do strengthening exercises at least twice each week. Do these in addition to the moderate-intensity exercise. Spend less time sitting. Even light physical activity can be beneficial. Other tips Work with your health care provider to achieve or maintain a healthy weight. Do not use any products that contain nicotine or tobacco. These products include cigarettes, chewing  tobacco, and vaping devices, such as e-cigarettes. If you need help quitting, ask your health care provider. Know your numbers. Ask your health care provider to check your cholesterol and your blood sugar (glucose). Continue to have your blood tested as directed by your health care provider. Do I need screening for cancer? Depending on your health history and family history, you may need to have cancer screenings at different stages of your life. This may include screening for: Breast cancer. Cervical cancer. Lung cancer. Colorectal cancer. What is my risk for osteoporosis? After menopause, you may be at increased risk for osteoporosis. Osteoporosis is a condition in which bone destruction happens more quickly than new bone creation. To help prevent osteoporosis or the bone fractures that can happen because of osteoporosis, you may take the following actions: If you are 46-31 years old, get at least 1,000 mg of calcium and at least 600 international units (IU) of vitamin D  per day. If you are older than age 17 but younger than age 58, get at least 1,200 mg of calcium and at least 600 international units (IU) of vitamin D  per day. If you are older than age 41, get at least 1,200 mg of calcium and at least 800 international units (IU) of vitamin D  per day. Smoking and drinking excessive alcohol increase the risk of osteoporosis. Eat foods that are rich in calcium and vitamin D , and do weight-bearing exercises several times each week as directed by your health care provider. How does menopause affect my mental health? Depression may occur at any age, but it is more common as you become older. Common symptoms of depression include: Feeling depressed. Changes in sleep patterns. Changes in appetite or eating patterns. Feeling an overall lack of motivation or enjoyment of activities that you previously  enjoyed. Frequent crying spells. Talk with your health care provider if you think that you are  experiencing any of these symptoms. General instructions See your health care provider for regular wellness exams and vaccines. This may include: Scheduling regular health, dental, and eye exams. Getting and maintaining your vaccines. These include: Influenza vaccine. Get this vaccine each year before the flu season begins. Pneumonia vaccine. Shingles vaccine. Tetanus, diphtheria, and pertussis (Tdap) booster vaccine. Your health care provider may also recommend other immunizations. Tell your health care provider if you have ever been abused or do not feel safe at home. Summary Menopause is a normal process in which your ability to get pregnant comes to an end. This condition causes hot flashes, night sweats, decreased interest in sex, mood swings, headaches, or lack of sleep. Treatment for this condition may include hormone replacement therapy. Take actions to keep yourself healthy, including exercising regularly, eating a healthy diet, watching your weight, and checking your blood pressure and blood sugar levels. Get screened for cancer and depression. Make sure that you are up to date with all your vaccines. This information is not intended to replace advice given to you by your health care provider. Make sure you discuss any questions you have with your health care provider. Document Revised: 10/22/2020 Document Reviewed: 10/22/2020 Elsevier Patient Education  2024 [employer].

## 2024-04-18 ENCOUNTER — Encounter: Payer: Self-pay | Admitting: Family

## 2024-04-18 NOTE — Telephone Encounter (Signed)
 Date corrected ipt is aware and printed new copy for chart and placed copy upfront for pt to pick up as well.

## 2024-04-20 LAB — CBC WITH DIFFERENTIAL/PLATELET
Basophils Absolute: 0.1 x10E3/uL (ref 0.0–0.2)
Basos: 1 %
EOS (ABSOLUTE): 0.1 x10E3/uL (ref 0.0–0.4)
Eos: 1 %
Hematocrit: 38.5 % (ref 34.0–46.6)
Hemoglobin: 12.7 g/dL (ref 11.1–15.9)
Immature Grans (Abs): 0 x10E3/uL (ref 0.0–0.1)
Immature Granulocytes: 0 %
Lymphocytes Absolute: 1.8 x10E3/uL (ref 0.7–3.1)
Lymphs: 40 %
MCH: 31.2 pg (ref 26.6–33.0)
MCHC: 33 g/dL (ref 31.5–35.7)
MCV: 95 fL (ref 79–97)
Monocytes Absolute: 0.3 x10E3/uL (ref 0.1–0.9)
Monocytes: 6 %
Neutrophils Absolute: 2.3 x10E3/uL (ref 1.4–7.0)
Neutrophils: 52 %
Platelets: 269 x10E3/uL (ref 150–450)
RBC: 4.07 x10E6/uL (ref 3.77–5.28)
RDW: 13.1 % (ref 11.7–15.4)
WBC: 4.5 x10E3/uL (ref 3.4–10.8)

## 2024-04-20 LAB — HEMOGLOBIN A1C
Est. average glucose Bld gHb Est-mCnc: 103 mg/dL
Hgb A1c MFr Bld: 5.2 % (ref 4.8–5.6)

## 2024-04-20 LAB — COMPREHENSIVE METABOLIC PANEL WITH GFR
ALT: 20 IU/L (ref 0–32)
AST: 27 IU/L (ref 0–40)
Albumin: 4.5 g/dL (ref 3.9–4.9)
Alkaline Phosphatase: 75 IU/L (ref 49–135)
BUN/Creatinine Ratio: 18 (ref 12–28)
BUN: 15 mg/dL (ref 8–27)
Bilirubin Total: 0.4 mg/dL (ref 0.0–1.2)
CO2: 24 mmol/L (ref 20–29)
Calcium: 9.4 mg/dL (ref 8.7–10.3)
Chloride: 101 mmol/L (ref 96–106)
Creatinine, Ser: 0.82 mg/dL (ref 0.57–1.00)
Globulin, Total: 2.5 g/dL (ref 1.5–4.5)
Glucose: 66 mg/dL — ABNORMAL LOW (ref 70–99)
Potassium: 3.6 mmol/L (ref 3.5–5.2)
Sodium: 138 mmol/L (ref 134–144)
Total Protein: 7 g/dL (ref 6.0–8.5)
eGFR: 80 mL/min/1.73 (ref >59)

## 2024-04-20 LAB — LIPID PANEL
Chol/HDL Ratio: 1.7 ratio (ref 0.0–4.4)
Cholesterol, Total: 182 mg/dL (ref 100–199)
HDL: 110 mg/dL (ref >39)
LDL Chol Calc (NIH): 62 mg/dL (ref 0–99)
Triglycerides: 47 mg/dL (ref 0–149)
VLDL Cholesterol Cal: 10 mg/dL (ref 5–40)

## 2024-04-25 ENCOUNTER — Ambulatory Visit: Payer: Self-pay | Admitting: Family

## 2024-04-26 NOTE — Telephone Encounter (Signed)
 Noted

## 2024-04-26 NOTE — Telephone Encounter (Signed)
 Pt came in to pick up her forms.

## 2024-05-11 ENCOUNTER — Other Ambulatory Visit: Payer: Self-pay

## 2024-05-11 ENCOUNTER — Encounter: Payer: Self-pay | Admitting: Internal Medicine

## 2024-06-06 ENCOUNTER — Other Ambulatory Visit: Payer: Self-pay

## 2024-06-07 ENCOUNTER — Other Ambulatory Visit: Payer: Self-pay

## 2024-06-13 ENCOUNTER — Encounter: Payer: Self-pay | Admitting: Family

## 2024-06-13 ENCOUNTER — Other Ambulatory Visit: Payer: Self-pay

## 2024-06-13 ENCOUNTER — Encounter: Payer: Self-pay | Admitting: Internal Medicine

## 2024-06-13 DIAGNOSIS — F339 Major depressive disorder, recurrent, unspecified: Secondary | ICD-10-CM

## 2024-06-13 DIAGNOSIS — I10 Essential (primary) hypertension: Secondary | ICD-10-CM

## 2024-06-13 MED ORDER — LOSARTAN POTASSIUM 100 MG PO TABS
100.0000 mg | ORAL_TABLET | Freq: Every day | ORAL | 3 refills | Status: AC
  Filled 2024-06-13: qty 90, 90d supply, fill #0

## 2024-06-13 MED ORDER — BUPROPION HCL ER (XL) 300 MG PO TB24
300.0000 mg | ORAL_TABLET | Freq: Every morning | ORAL | 1 refills | Status: AC
  Filled 2024-06-13: qty 90, 90d supply, fill #0

## 2024-06-13 MED ORDER — HYDROCHLOROTHIAZIDE 25 MG PO TABS
12.5000 mg | ORAL_TABLET | Freq: Every day | ORAL | 1 refills | Status: AC
  Filled 2024-06-13: qty 45, 90d supply, fill #0

## 2024-06-13 NOTE — Telephone Encounter (Signed)
 Rx sent in to pharmacy pt has been notified

## 2024-06-15 ENCOUNTER — Other Ambulatory Visit: Payer: Self-pay

## 2024-06-21 ENCOUNTER — Other Ambulatory Visit: Payer: Self-pay

## 2024-06-21 ENCOUNTER — Encounter: Payer: Self-pay | Admitting: Family

## 2024-06-21 DIAGNOSIS — E669 Obesity, unspecified: Secondary | ICD-10-CM

## 2024-06-21 MED ORDER — ZEPBOUND 12.5 MG/0.5ML ~~LOC~~ SOAJ
12.5000 mg | SUBCUTANEOUS | 3 refills | Status: AC
  Filled 2024-06-21: qty 2, 28d supply, fill #0
  Filled 2024-07-18: qty 2, 28d supply, fill #1

## 2024-06-22 NOTE — Telephone Encounter (Signed)
 Spoke to pt she will go back to the 12.5 and see how she feels and then will schedule a f/up

## 2024-06-23 ENCOUNTER — Other Ambulatory Visit: Payer: Self-pay | Admitting: Family

## 2024-06-23 ENCOUNTER — Other Ambulatory Visit: Payer: Self-pay

## 2024-06-23 DIAGNOSIS — I10 Essential (primary) hypertension: Secondary | ICD-10-CM

## 2024-06-23 DIAGNOSIS — F339 Major depressive disorder, recurrent, unspecified: Secondary | ICD-10-CM

## 2024-07-18 ENCOUNTER — Other Ambulatory Visit: Payer: Self-pay
